# Patient Record
Sex: Female | Born: 1955 | Race: White | Hispanic: No | Marital: Married | Smoking: Never smoker
Health system: Southern US, Community
[De-identification: ages and names within clinical notes are randomized; demographics above are authoritative.]

## PROBLEM LIST (undated history)

## (undated) DIAGNOSIS — G5600 Carpal tunnel syndrome, unspecified upper limb: Secondary | ICD-10-CM

## (undated) DIAGNOSIS — M1990 Unspecified osteoarthritis, unspecified site: Secondary | ICD-10-CM

## (undated) DIAGNOSIS — F329 Major depressive disorder, single episode, unspecified: Secondary | ICD-10-CM

## (undated) DIAGNOSIS — I10 Essential (primary) hypertension: Secondary | ICD-10-CM

## (undated) DIAGNOSIS — E785 Hyperlipidemia, unspecified: Secondary | ICD-10-CM

## (undated) DIAGNOSIS — R1011 Right upper quadrant pain: Secondary | ICD-10-CM

## (undated) DIAGNOSIS — K219 Gastro-esophageal reflux disease without esophagitis: Secondary | ICD-10-CM

## (undated) DIAGNOSIS — F32A Depression, unspecified: Secondary | ICD-10-CM

## (undated) HISTORY — DX: Unspecified osteoarthritis, unspecified site: M19.90

## (undated) HISTORY — PX: EYE SURGERY: SHX253

## (undated) HISTORY — DX: Carpal tunnel syndrome, unspecified upper limb: G56.00

## (undated) HISTORY — DX: Gastro-esophageal reflux disease without esophagitis: K21.9

## (undated) HISTORY — DX: Major depressive disorder, single episode, unspecified: F32.9

## (undated) HISTORY — DX: Depression, unspecified: F32.A

## (undated) HISTORY — DX: Right upper quadrant pain: R10.11

## (undated) HISTORY — DX: Hyperlipidemia, unspecified: E78.5

## (undated) HISTORY — DX: Essential (primary) hypertension: I10

---

## 2005-02-23 ENCOUNTER — Ambulatory Visit: Payer: Self-pay | Admitting: Family Medicine

## 2005-02-24 ENCOUNTER — Ambulatory Visit: Payer: Self-pay | Admitting: Family Medicine

## 2005-03-17 ENCOUNTER — Ambulatory Visit: Payer: Self-pay | Admitting: Family Medicine

## 2005-03-24 ENCOUNTER — Ambulatory Visit: Payer: Self-pay | Admitting: Family Medicine

## 2005-03-27 ENCOUNTER — Encounter: Admission: RE | Admit: 2005-03-27 | Discharge: 2005-03-27 | Payer: Self-pay | Admitting: Family Medicine

## 2005-04-11 ENCOUNTER — Ambulatory Visit: Payer: Self-pay | Admitting: Family Medicine

## 2005-06-01 ENCOUNTER — Ambulatory Visit: Payer: Self-pay | Admitting: Family Medicine

## 2005-08-25 ENCOUNTER — Ambulatory Visit: Payer: Self-pay | Admitting: Family Medicine

## 2005-10-03 ENCOUNTER — Ambulatory Visit: Payer: Self-pay | Admitting: Family Medicine

## 2005-10-18 ENCOUNTER — Ambulatory Visit: Payer: Self-pay

## 2005-12-06 ENCOUNTER — Ambulatory Visit: Payer: Self-pay | Admitting: Family Medicine

## 2006-03-30 ENCOUNTER — Ambulatory Visit: Payer: Self-pay | Admitting: Family Medicine

## 2006-05-30 ENCOUNTER — Ambulatory Visit: Payer: Self-pay | Admitting: Family Medicine

## 2006-05-30 LAB — CONVERTED CEMR LAB
ALT: 27 units/L (ref 0–40)
AST: 19 units/L (ref 0–37)
Albumin: 3.8 g/dL (ref 3.5–5.2)
Alkaline Phosphatase: 87 units/L (ref 39–117)
BUN: 14 mg/dL (ref 6–23)
Basophils Absolute: 0 10*3/uL (ref 0.0–0.1)
Basophils Relative: 0.4 % (ref 0.0–1.0)
CO2: 30 meq/L (ref 19–32)
Calcium: 9.5 mg/dL (ref 8.4–10.5)
Chloride: 104 meq/L (ref 96–112)
Creatinine, Ser: 0.8 mg/dL (ref 0.4–1.2)
Eosinophil percent: 0.1 % (ref 0.0–5.0)
GFR calc non Af Amer: 80 mL/min
Glomerular Filtration Rate, Af Am: 97 mL/min/{1.73_m2}
Glucose, Bld: 177 mg/dL — ABNORMAL HIGH (ref 70–99)
HCT: 43.9 % (ref 36.0–46.0)
Hemoglobin: 14.7 g/dL (ref 12.0–15.0)
Hgb A1c MFr Bld: 8.3 % — ABNORMAL HIGH (ref 4.6–6.0)
Lymphocytes Relative: 35.8 % (ref 12.0–46.0)
MCHC: 33.4 g/dL (ref 30.0–36.0)
MCV: 95.4 fL (ref 78.0–100.0)
Monocytes Absolute: 0.5 10*3/uL (ref 0.2–0.7)
Monocytes Relative: 7.9 % (ref 3.0–11.0)
Neutro Abs: 3.6 10*3/uL (ref 1.4–7.7)
Neutrophils Relative %: 55.8 % (ref 43.0–77.0)
Platelets: 219 10*3/uL (ref 150–400)
Potassium: 4.6 meq/L (ref 3.5–5.1)
RBC: 4.6 M/uL (ref 3.87–5.11)
RDW: 12 % (ref 11.5–14.6)
Sodium: 141 meq/L (ref 135–145)
TSH: 0.87 microintl units/mL (ref 0.35–5.50)
Total Bilirubin: 0.7 mg/dL (ref 0.3–1.2)
Total Protein: 7.3 g/dL (ref 6.0–8.3)
WBC: 6.4 10*3/uL (ref 4.5–10.5)

## 2006-06-25 ENCOUNTER — Encounter: Admission: RE | Admit: 2006-06-25 | Discharge: 2006-06-25 | Payer: Self-pay | Admitting: Family Medicine

## 2006-07-20 ENCOUNTER — Ambulatory Visit: Payer: Self-pay | Admitting: Family Medicine

## 2006-08-30 ENCOUNTER — Ambulatory Visit: Payer: Self-pay | Admitting: Family Medicine

## 2006-08-30 LAB — CONVERTED CEMR LAB
ALT: 29 units/L (ref 0–40)
AST: 21 units/L (ref 0–37)
Albumin: 3.9 g/dL (ref 3.5–5.2)
Alkaline Phosphatase: 100 units/L (ref 39–117)
BUN: 11 mg/dL (ref 6–23)
Bilirubin, Direct: 0.1 mg/dL (ref 0.0–0.3)
CO2: 31 meq/L (ref 19–32)
Calcium: 9.3 mg/dL (ref 8.4–10.5)
Chloride: 103 meq/L (ref 96–112)
Creatinine, Ser: 0.6 mg/dL (ref 0.4–1.2)
GFR calc Af Amer: 135 mL/min
GFR calc non Af Amer: 112 mL/min
Glucose, Bld: 146 mg/dL — ABNORMAL HIGH (ref 70–99)
Hgb A1c MFr Bld: 8.5 % — ABNORMAL HIGH (ref 4.6–6.0)
Potassium: 4.4 meq/L (ref 3.5–5.1)
Sodium: 141 meq/L (ref 135–145)
Total Bilirubin: 0.7 mg/dL (ref 0.3–1.2)
Total Protein: 7 g/dL (ref 6.0–8.3)

## 2006-09-04 ENCOUNTER — Encounter: Payer: Self-pay | Admitting: Family Medicine

## 2006-09-10 ENCOUNTER — Encounter: Payer: Self-pay | Admitting: Cardiovascular Disease

## 2006-09-10 ENCOUNTER — Ambulatory Visit: Payer: Self-pay

## 2006-09-12 ENCOUNTER — Ambulatory Visit: Payer: Self-pay | Admitting: Internal Medicine

## 2006-09-13 ENCOUNTER — Ambulatory Visit: Payer: Self-pay | Admitting: Family Medicine

## 2006-10-03 ENCOUNTER — Ambulatory Visit: Payer: Self-pay | Admitting: Gastroenterology

## 2006-10-03 LAB — CONVERTED CEMR LAB
IgA: 361 mg/dL (ref 68–378)
TSH: 0.77 microintl units/mL (ref 0.35–5.50)
Tissue Transglutaminase Ab, IgA: <3 units (ref <5)

## 2006-10-24 ENCOUNTER — Ambulatory Visit: Payer: Self-pay | Admitting: Gastroenterology

## 2006-10-24 LAB — CONVERTED CEMR LAB
Fecal Occult Blood: NEGATIVE
OCCULT 1: NEGATIVE
OCCULT 2: NEGATIVE
OCCULT 3: NEGATIVE
OCCULT 4: NEGATIVE
OCCULT 5: NEGATIVE

## 2007-01-22 ENCOUNTER — Ambulatory Visit: Payer: Self-pay | Admitting: Family Medicine

## 2007-01-22 DIAGNOSIS — F329 Major depressive disorder, single episode, unspecified: Secondary | ICD-10-CM | POA: Insufficient documentation

## 2007-01-22 DIAGNOSIS — K219 Gastro-esophageal reflux disease without esophagitis: Secondary | ICD-10-CM | POA: Insufficient documentation

## 2007-01-22 DIAGNOSIS — IMO0002 Reserved for concepts with insufficient information to code with codable children: Secondary | ICD-10-CM | POA: Insufficient documentation

## 2007-01-22 DIAGNOSIS — I10 Essential (primary) hypertension: Secondary | ICD-10-CM | POA: Insufficient documentation

## 2007-01-24 LAB — CONVERTED CEMR LAB
ALT: 24 units/L (ref 0–35)
AST: 18 units/L (ref 0–37)
Albumin: 3.9 g/dL (ref 3.5–5.2)
Alkaline Phosphatase: 100 units/L (ref 39–117)
BUN: 12 mg/dL (ref 6–23)
Bilirubin, Direct: 0.1 mg/dL (ref 0.0–0.3)
CO2: 31 meq/L (ref 19–32)
Calcium: 9.5 mg/dL (ref 8.4–10.5)
Chloride: 105 meq/L (ref 96–112)
Cholesterol: 196 mg/dL (ref 0–200)
Creatinine, Ser: 0.8 mg/dL (ref 0.4–1.2)
Creatinine,U: 169.5 mg/dL
GFR calc Af Amer: 97 mL/min
GFR calc non Af Amer: 80 mL/min
Glucose, Bld: 169 mg/dL — ABNORMAL HIGH (ref 70–99)
HDL: 37.3 mg/dL — ABNORMAL LOW (ref >39.0)
Hgb A1c MFr Bld: 8.5 % — ABNORMAL HIGH (ref 4.6–6.0)
LDL Cholesterol: 120 mg/dL — ABNORMAL HIGH (ref 0–99)
Microalb Creat Ratio: 1.8 mg/g (ref 0.0–30.0)
Microalb, Ur: 0.3 mg/dL (ref 0.0–1.9)
Potassium: 4.4 meq/L (ref 3.5–5.1)
Sodium: 141 meq/L (ref 135–145)
TSH: 0.85 microintl units/mL (ref 0.35–5.50)
Total Bilirubin: 0.8 mg/dL (ref 0.3–1.2)
Total CHOL/HDL Ratio: 5.3
Total Protein: 7.2 g/dL (ref 6.0–8.3)
Triglycerides: 195 mg/dL — ABNORMAL HIGH (ref 0–149)
VLDL: 39 mg/dL (ref 0–40)

## 2007-02-11 ENCOUNTER — Telehealth (INDEPENDENT_AMBULATORY_CARE_PROVIDER_SITE_OTHER): Payer: Self-pay | Admitting: *Deleted

## 2007-03-11 ENCOUNTER — Telehealth (INDEPENDENT_AMBULATORY_CARE_PROVIDER_SITE_OTHER): Payer: Self-pay | Admitting: *Deleted

## 2007-03-12 ENCOUNTER — Ambulatory Visit: Payer: Self-pay | Admitting: Family Medicine

## 2007-03-12 DIAGNOSIS — Z8669 Personal history of other diseases of the nervous system and sense organs: Secondary | ICD-10-CM | POA: Insufficient documentation

## 2007-03-15 ENCOUNTER — Encounter (INDEPENDENT_AMBULATORY_CARE_PROVIDER_SITE_OTHER): Payer: Self-pay | Admitting: *Deleted

## 2007-05-20 ENCOUNTER — Ambulatory Visit: Payer: Self-pay | Admitting: Family Medicine

## 2007-05-20 DIAGNOSIS — E785 Hyperlipidemia, unspecified: Secondary | ICD-10-CM | POA: Insufficient documentation

## 2007-05-21 LAB — CONVERTED CEMR LAB
ALT: 27 units/L (ref 0–35)
AST: 16 units/L (ref 0–37)
Albumin: 4 g/dL (ref 3.5–5.2)
Alkaline Phosphatase: 95 units/L (ref 39–117)
BUN: 9 mg/dL (ref 6–23)
Basophils Absolute: 0.2 10*3/uL — ABNORMAL HIGH (ref 0.0–0.1)
Basophils Relative: 2.3 % — ABNORMAL HIGH (ref 0.0–1.0)
Bilirubin, Direct: 0.1 mg/dL (ref 0.0–0.3)
CO2: 30 meq/L (ref 19–32)
Calcium: 9.4 mg/dL (ref 8.4–10.5)
Chloride: 101 meq/L (ref 96–112)
Cholesterol: 187 mg/dL (ref 0–200)
Creatinine, Ser: 0.7 mg/dL (ref 0.4–1.2)
Eosinophils Absolute: 0 10*3/uL (ref 0.0–0.6)
Eosinophils Relative: 0 % (ref 0.0–5.0)
GFR calc Af Amer: 113 mL/min
GFR calc non Af Amer: 93 mL/min
Glucose, Bld: 153 mg/dL — ABNORMAL HIGH (ref 70–99)
HCT: 42.8 % (ref 36.0–46.0)
HDL: 37.5 mg/dL — ABNORMAL LOW (ref >39.0)
Hemoglobin: 14.8 g/dL (ref 12.0–15.0)
Hgb A1c MFr Bld: 7.9 % — ABNORMAL HIGH (ref 4.6–6.0)
LDL Cholesterol: 119 mg/dL — ABNORMAL HIGH (ref 0–99)
Lymphocytes Relative: 34.9 % (ref 12.0–46.0)
MCHC: 34.7 g/dL (ref 30.0–36.0)
MCV: 94.1 fL (ref 78.0–100.0)
Monocytes Absolute: 0.6 10*3/uL (ref 0.2–0.7)
Monocytes Relative: 6.8 % (ref 3.0–11.0)
Neutro Abs: 4.6 10*3/uL (ref 1.4–7.7)
Neutrophils Relative %: 56 % (ref 43.0–77.0)
Platelets: 243 10*3/uL (ref 150–400)
Potassium: 4.3 meq/L (ref 3.5–5.1)
RBC: 4.55 M/uL (ref 3.87–5.11)
RDW: 12 % (ref 11.5–14.6)
Sodium: 139 meq/L (ref 135–145)
Total Bilirubin: 0.7 mg/dL (ref 0.3–1.2)
Total CHOL/HDL Ratio: 5
Total Protein: 7.4 g/dL (ref 6.0–8.3)
Triglycerides: 154 mg/dL — ABNORMAL HIGH (ref 0–149)
VLDL: 31 mg/dL (ref 0–40)
WBC: 8.3 10*3/uL (ref 4.5–10.5)

## 2007-08-08 ENCOUNTER — Ambulatory Visit: Payer: Self-pay | Admitting: Family Medicine

## 2007-08-08 DIAGNOSIS — L258 Unspecified contact dermatitis due to other agents: Secondary | ICD-10-CM | POA: Insufficient documentation

## 2007-10-10 ENCOUNTER — Ambulatory Visit: Payer: Self-pay | Admitting: Family Medicine

## 2007-10-22 LAB — CONVERTED CEMR LAB
ALT: 29 units/L (ref 0–35)
AST: 22 units/L (ref 0–37)
Albumin: 3.8 g/dL (ref 3.5–5.2)
Alkaline Phosphatase: 85 units/L (ref 39–117)
BUN: 14 mg/dL (ref 6–23)
Bilirubin, Direct: 0.1 mg/dL (ref 0.0–0.3)
CO2: 29 meq/L (ref 19–32)
Calcium: 8.8 mg/dL (ref 8.4–10.5)
Chloride: 103 meq/L (ref 96–112)
Cholesterol: 201 mg/dL (ref 0–200)
Creatinine, Ser: 0.7 mg/dL (ref 0.4–1.2)
Direct LDL: 138.5 mg/dL
GFR calc Af Amer: 113 mL/min
GFR calc non Af Amer: 93 mL/min
Glucose, Bld: 164 mg/dL — ABNORMAL HIGH (ref 70–99)
HDL: 43.6 mg/dL (ref >39.0)
Hgb A1c MFr Bld: 7.2 % — ABNORMAL HIGH (ref 4.6–6.0)
Potassium: 4 meq/L (ref 3.5–5.1)
Sodium: 137 meq/L (ref 135–145)
Total Bilirubin: 0.6 mg/dL (ref 0.3–1.2)
Total CHOL/HDL Ratio: 4.6
Total Protein: 7.2 g/dL (ref 6.0–8.3)
Triglycerides: 124 mg/dL (ref 0–149)
VLDL: 25 mg/dL (ref 0–40)

## 2007-10-23 ENCOUNTER — Encounter (INDEPENDENT_AMBULATORY_CARE_PROVIDER_SITE_OTHER): Payer: Self-pay | Admitting: *Deleted

## 2007-12-05 ENCOUNTER — Telehealth: Payer: Self-pay | Admitting: Family Medicine

## 2007-12-30 ENCOUNTER — Ambulatory Visit: Payer: Self-pay | Admitting: Internal Medicine

## 2007-12-30 ENCOUNTER — Inpatient Hospital Stay (HOSPITAL_COMMUNITY): Admission: AD | Admit: 2007-12-30 | Discharge: 2008-01-03 | Payer: Self-pay | Admitting: Internal Medicine

## 2007-12-30 ENCOUNTER — Telehealth (INDEPENDENT_AMBULATORY_CARE_PROVIDER_SITE_OTHER): Payer: Self-pay | Admitting: *Deleted

## 2008-01-03 ENCOUNTER — Encounter: Payer: Self-pay | Admitting: Internal Medicine

## 2008-01-06 ENCOUNTER — Telehealth (INDEPENDENT_AMBULATORY_CARE_PROVIDER_SITE_OTHER): Payer: Self-pay | Admitting: *Deleted

## 2008-01-10 ENCOUNTER — Ambulatory Visit: Payer: Self-pay | Admitting: Internal Medicine

## 2008-01-10 DIAGNOSIS — B379 Candidiasis, unspecified: Secondary | ICD-10-CM | POA: Insufficient documentation

## 2008-01-10 DIAGNOSIS — T887XXA Unspecified adverse effect of drug or medicament, initial encounter: Secondary | ICD-10-CM | POA: Insufficient documentation

## 2008-01-10 DIAGNOSIS — J189 Pneumonia, unspecified organism: Secondary | ICD-10-CM | POA: Insufficient documentation

## 2008-03-03 ENCOUNTER — Ambulatory Visit: Payer: Self-pay | Admitting: Family Medicine

## 2008-03-03 DIAGNOSIS — M722 Plantar fascial fibromatosis: Secondary | ICD-10-CM | POA: Insufficient documentation

## 2008-03-20 ENCOUNTER — Encounter (INDEPENDENT_AMBULATORY_CARE_PROVIDER_SITE_OTHER): Payer: Self-pay | Admitting: *Deleted

## 2008-04-21 ENCOUNTER — Encounter: Payer: Self-pay | Admitting: Family Medicine

## 2008-04-27 ENCOUNTER — Ambulatory Visit: Payer: Self-pay | Admitting: Family Medicine

## 2008-05-03 LAB — CONVERTED CEMR LAB
ALT: 18 units/L (ref 0–35)
AST: 18 units/L (ref 0–37)
Albumin: 3.8 g/dL (ref 3.5–5.2)
Alkaline Phosphatase: 82 units/L (ref 39–117)
Bilirubin, Direct: 0.1 mg/dL (ref 0.0–0.3)
Cholesterol: 176 mg/dL (ref 0–200)
Creatinine,U: 175.8 mg/dL
HDL: 40.4 mg/dL (ref >39.0)
Hgb A1c MFr Bld: 7.7 % — ABNORMAL HIGH (ref 4.6–6.0)
LDL Cholesterol: 114 mg/dL — ABNORMAL HIGH (ref 0–99)
Microalb Creat Ratio: 2.8 mg/g (ref 0.0–30.0)
Microalb, Ur: 0.5 mg/dL (ref 0.0–1.9)
Total Bilirubin: 0.8 mg/dL (ref 0.3–1.2)
Total CHOL/HDL Ratio: 4.4
Total Protein: 7.1 g/dL (ref 6.0–8.3)
Triglycerides: 109 mg/dL (ref 0–149)
VLDL: 22 mg/dL (ref 0–40)

## 2008-05-04 ENCOUNTER — Telehealth (INDEPENDENT_AMBULATORY_CARE_PROVIDER_SITE_OTHER): Payer: Self-pay | Admitting: *Deleted

## 2008-06-02 ENCOUNTER — Encounter: Payer: Self-pay | Admitting: Family Medicine

## 2008-06-03 ENCOUNTER — Encounter (INDEPENDENT_AMBULATORY_CARE_PROVIDER_SITE_OTHER): Payer: Self-pay | Admitting: *Deleted

## 2008-06-08 ENCOUNTER — Telehealth (INDEPENDENT_AMBULATORY_CARE_PROVIDER_SITE_OTHER): Payer: Self-pay | Admitting: *Deleted

## 2008-07-02 ENCOUNTER — Ambulatory Visit: Payer: Self-pay | Admitting: Family Medicine

## 2008-07-02 ENCOUNTER — Encounter (INDEPENDENT_AMBULATORY_CARE_PROVIDER_SITE_OTHER): Payer: Self-pay | Admitting: *Deleted

## 2008-07-03 ENCOUNTER — Encounter (INDEPENDENT_AMBULATORY_CARE_PROVIDER_SITE_OTHER): Payer: Self-pay | Admitting: *Deleted

## 2008-07-07 LAB — CONVERTED CEMR LAB
ALT: 25 units/L (ref 0–35)
AST: 19 units/L (ref 0–37)
Albumin: 4 g/dL (ref 3.5–5.2)
Alkaline Phosphatase: 85 units/L (ref 39–117)
BUN: 13 mg/dL (ref 6–23)
Basophils Absolute: 0 10*3/uL (ref 0.0–0.1)
Basophils Relative: 0.2 % (ref 0.0–3.0)
Bilirubin, Direct: 0.1 mg/dL (ref 0.0–0.3)
CO2: 27 meq/L (ref 19–32)
Calcium: 9.1 mg/dL (ref 8.4–10.5)
Chloride: 106 meq/L (ref 96–112)
Cholesterol: 173 mg/dL (ref 0–200)
Creatinine, Ser: 0.6 mg/dL (ref 0.4–1.2)
Creatinine,U: 137.9 mg/dL
Eosinophils Absolute: 0 10*3/uL (ref 0.0–0.7)
Eosinophils Relative: 0 % (ref 0.0–5.0)
GFR calc Af Amer: 134 mL/min
GFR calc non Af Amer: 111 mL/min
Glucose, Bld: 175 mg/dL — ABNORMAL HIGH (ref 70–99)
HCT: 42.2 % (ref 36.0–46.0)
HDL: 34.4 mg/dL — ABNORMAL LOW (ref >39.0)
Hemoglobin: 14.7 g/dL (ref 12.0–15.0)
Hgb A1c MFr Bld: 7.2 % — ABNORMAL HIGH (ref 4.6–6.0)
LDL Cholesterol: 108 mg/dL — ABNORMAL HIGH (ref 0–99)
Lymphocytes Relative: 41.6 % (ref 12.0–46.0)
MCHC: 34.7 g/dL (ref 30.0–36.0)
MCV: 94.4 fL (ref 78.0–100.0)
Microalb Creat Ratio: 3.6 mg/g (ref 0.0–30.0)
Microalb, Ur: 0.5 mg/dL (ref 0.0–1.9)
Monocytes Absolute: 0.6 10*3/uL (ref 0.1–1.0)
Monocytes Relative: 8.7 % (ref 3.0–12.0)
Neutro Abs: 3.1 10*3/uL (ref 1.4–7.7)
Neutrophils Relative %: 49.5 % (ref 43.0–77.0)
Platelets: 201 10*3/uL (ref 150–400)
Potassium: 4.1 meq/L (ref 3.5–5.1)
RBC: 4.47 M/uL (ref 3.87–5.11)
RDW: 12.3 % (ref 11.5–14.6)
Sodium: 140 meq/L (ref 135–145)
TSH: 0.81 microintl units/mL (ref 0.35–5.50)
Total Bilirubin: 0.7 mg/dL (ref 0.3–1.2)
Total CHOL/HDL Ratio: 5
Total Protein: 7.3 g/dL (ref 6.0–8.3)
Triglycerides: 153 mg/dL — ABNORMAL HIGH (ref 0–149)
VLDL: 31 mg/dL (ref 0–40)
Vit D, 1,25-Dihydroxy: 23 — ABNORMAL LOW (ref 30–89)
WBC: 6.4 10*3/uL (ref 4.5–10.5)

## 2008-07-09 ENCOUNTER — Telehealth: Payer: Self-pay | Admitting: Family Medicine

## 2008-07-09 ENCOUNTER — Encounter (INDEPENDENT_AMBULATORY_CARE_PROVIDER_SITE_OTHER): Payer: Self-pay | Admitting: *Deleted

## 2008-07-13 ENCOUNTER — Other Ambulatory Visit: Admission: RE | Admit: 2008-07-13 | Discharge: 2008-07-13 | Payer: Self-pay | Admitting: Family Medicine

## 2008-07-13 ENCOUNTER — Encounter: Payer: Self-pay | Admitting: Family Medicine

## 2008-07-13 ENCOUNTER — Ambulatory Visit: Payer: Self-pay | Admitting: Family Medicine

## 2008-07-14 ENCOUNTER — Encounter: Payer: Self-pay | Admitting: Family Medicine

## 2008-07-20 ENCOUNTER — Encounter: Admission: RE | Admit: 2008-07-20 | Discharge: 2008-07-20 | Payer: Self-pay | Admitting: Family Medicine

## 2008-07-20 ENCOUNTER — Telehealth (INDEPENDENT_AMBULATORY_CARE_PROVIDER_SITE_OTHER): Payer: Self-pay | Admitting: *Deleted

## 2008-07-20 ENCOUNTER — Encounter (INDEPENDENT_AMBULATORY_CARE_PROVIDER_SITE_OTHER): Payer: Self-pay | Admitting: *Deleted

## 2008-07-20 LAB — HM MAMMOGRAPHY: HM Mammogram: NORMAL

## 2008-07-24 ENCOUNTER — Encounter (INDEPENDENT_AMBULATORY_CARE_PROVIDER_SITE_OTHER): Payer: Self-pay | Admitting: *Deleted

## 2008-07-29 ENCOUNTER — Encounter (INDEPENDENT_AMBULATORY_CARE_PROVIDER_SITE_OTHER): Payer: Self-pay | Admitting: *Deleted

## 2008-09-10 ENCOUNTER — Telehealth (INDEPENDENT_AMBULATORY_CARE_PROVIDER_SITE_OTHER): Payer: Self-pay | Admitting: *Deleted

## 2008-10-05 ENCOUNTER — Ambulatory Visit: Payer: Self-pay | Admitting: Family Medicine

## 2008-10-12 ENCOUNTER — Encounter: Payer: Self-pay | Admitting: Family Medicine

## 2008-10-15 ENCOUNTER — Encounter: Payer: Self-pay | Admitting: Family Medicine

## 2008-10-24 LAB — CONVERTED CEMR LAB
ALT: 17 units/L (ref 0–35)
AST: 17 units/L (ref 0–37)
Albumin: 3.9 g/dL (ref 3.5–5.2)
Alkaline Phosphatase: 75 units/L (ref 39–117)
BUN: 16 mg/dL (ref 6–23)
Basophils Absolute: 0 10*3/uL (ref 0.0–0.1)
Basophils Relative: 0.1 % (ref 0.0–3.0)
Bilirubin, Direct: 0 mg/dL (ref 0.0–0.3)
CO2: 30 meq/L (ref 19–32)
Calcium: 8.9 mg/dL (ref 8.4–10.5)
Chloride: 103 meq/L (ref 96–112)
Cholesterol: 130 mg/dL (ref 0–200)
Creatinine, Ser: 0.7 mg/dL (ref 0.4–1.2)
Creatinine,U: 70.2 mg/dL
Eosinophils Absolute: 0 10*3/uL (ref 0.0–0.7)
Eosinophils Relative: 0.3 % (ref 0.0–5.0)
Folate: >20 ng/mL
GFR calc non Af Amer: 92.52 mL/min (ref >60)
Glucose, Bld: 105 mg/dL — ABNORMAL HIGH (ref 70–99)
HCT: 43.7 % (ref 36.0–46.0)
HDL: 46.2 mg/dL (ref >39.00)
Hemoglobin: 15.1 g/dL — ABNORMAL HIGH (ref 12.0–15.0)
Hgb A1c MFr Bld: 6.8 % — ABNORMAL HIGH (ref 4.6–6.5)
LDL Cholesterol: 66 mg/dL (ref 0–99)
Lymphocytes Relative: 34.6 % (ref 12.0–46.0)
Lymphs Abs: 2.8 10*3/uL (ref 0.7–4.0)
MCHC: 34.5 g/dL (ref 30.0–36.0)
MCV: 94.8 fL (ref 78.0–100.0)
Microalb Creat Ratio: 1.4 mg/g (ref 0.0–30.0)
Microalb, Ur: 0.1 mg/dL (ref 0.0–1.9)
Monocytes Absolute: 0.7 10*3/uL (ref 0.1–1.0)
Monocytes Relative: 8.7 % (ref 3.0–12.0)
Neutro Abs: 4.6 10*3/uL (ref 1.4–7.7)
Neutrophils Relative %: 56.3 % (ref 43.0–77.0)
Platelets: 205 10*3/uL (ref 150.0–400.0)
Potassium: 3.7 meq/L (ref 3.5–5.1)
RBC: 4.61 M/uL (ref 3.87–5.11)
RDW: 12.5 % (ref 11.5–14.6)
Sodium: 141 meq/L (ref 135–145)
Total Bilirubin: 0.8 mg/dL (ref 0.3–1.2)
Total CHOL/HDL Ratio: 3
Total Protein: 7.2 g/dL (ref 6.0–8.3)
Triglycerides: 89 mg/dL (ref 0.0–149.0)
VLDL: 17.8 mg/dL (ref 0.0–40.0)
Vitamin B-12: 378 pg/mL (ref 211–911)
WBC: 8.1 10*3/uL (ref 4.5–10.5)

## 2008-10-26 ENCOUNTER — Encounter (INDEPENDENT_AMBULATORY_CARE_PROVIDER_SITE_OTHER): Payer: Self-pay | Admitting: *Deleted

## 2008-11-02 ENCOUNTER — Encounter: Payer: Self-pay | Admitting: Family Medicine

## 2008-11-09 ENCOUNTER — Telehealth (INDEPENDENT_AMBULATORY_CARE_PROVIDER_SITE_OTHER): Payer: Self-pay | Admitting: *Deleted

## 2008-11-10 ENCOUNTER — Telehealth (INDEPENDENT_AMBULATORY_CARE_PROVIDER_SITE_OTHER): Payer: Self-pay | Admitting: *Deleted

## 2008-11-11 ENCOUNTER — Telehealth: Payer: Self-pay | Admitting: Family Medicine

## 2008-11-19 ENCOUNTER — Telehealth (INDEPENDENT_AMBULATORY_CARE_PROVIDER_SITE_OTHER): Payer: Self-pay | Admitting: *Deleted

## 2008-11-20 ENCOUNTER — Encounter: Payer: Self-pay | Admitting: Family Medicine

## 2008-11-27 ENCOUNTER — Encounter: Payer: Self-pay | Admitting: Family Medicine

## 2008-12-21 ENCOUNTER — Telehealth: Payer: Self-pay | Admitting: Family Medicine

## 2009-02-03 ENCOUNTER — Ambulatory Visit: Payer: Self-pay | Admitting: Family Medicine

## 2009-02-03 DIAGNOSIS — J209 Acute bronchitis, unspecified: Secondary | ICD-10-CM | POA: Insufficient documentation

## 2009-02-03 LAB — CONVERTED CEMR LAB
Bilirubin Urine: NEGATIVE
Blood in Urine, dipstick: NEGATIVE
Glucose, Urine, Semiquant: NEGATIVE
Ketones, urine, test strip: NEGATIVE
Nitrite: NEGATIVE
Protein, U semiquant: NEGATIVE
Specific Gravity, Urine: 1.025
Urobilinogen, UA: NEGATIVE
WBC Urine, dipstick: NEGATIVE

## 2009-02-04 ENCOUNTER — Telehealth (INDEPENDENT_AMBULATORY_CARE_PROVIDER_SITE_OTHER): Payer: Self-pay | Admitting: *Deleted

## 2009-02-08 LAB — CONVERTED CEMR LAB
Creatinine,U: 181 mg/dL
Microalb Creat Ratio: 4.4 mg/g (ref 0.0–30.0)
Microalb, Ur: 0.8 mg/dL (ref 0.0–1.9)

## 2009-02-09 ENCOUNTER — Encounter (INDEPENDENT_AMBULATORY_CARE_PROVIDER_SITE_OTHER): Payer: Self-pay | Admitting: *Deleted

## 2009-02-14 LAB — CONVERTED CEMR LAB
ALT: 21 units/L (ref 0–35)
AST: 24 units/L (ref 0–37)
Albumin: 4 g/dL (ref 3.5–5.2)
Alkaline Phosphatase: 81 units/L (ref 39–117)
BUN: 14 mg/dL (ref 6–23)
Basophils Absolute: 0.1 10*3/uL (ref 0.0–0.1)
Basophils Relative: 0.9 % (ref 0.0–3.0)
Bilirubin, Direct: 0 mg/dL (ref 0.0–0.3)
CO2: 30 meq/L (ref 19–32)
Calcium: 9.1 mg/dL (ref 8.4–10.5)
Chloride: 103 meq/L (ref 96–112)
Cholesterol: 250 mg/dL — ABNORMAL HIGH (ref 0–200)
Creatinine, Ser: 0.8 mg/dL (ref 0.4–1.2)
Direct LDL: 190.7 mg/dL
Eosinophils Absolute: 0 10*3/uL (ref 0.0–0.7)
Eosinophils Relative: 0.2 % (ref 0.0–5.0)
GFR calc non Af Amer: 79.21 mL/min (ref >60)
Glucose, Bld: 151 mg/dL — ABNORMAL HIGH (ref 70–99)
HCT: 42.3 % (ref 36.0–46.0)
HDL: 42.5 mg/dL (ref >39.00)
Hemoglobin: 14.7 g/dL (ref 12.0–15.0)
Hgb A1c MFr Bld: 7.6 % — ABNORMAL HIGH (ref 4.6–6.5)
Lymphocytes Relative: 30.1 % (ref 12.0–46.0)
Lymphs Abs: 2.5 10*3/uL (ref 0.7–4.0)
MCHC: 34.7 g/dL (ref 30.0–36.0)
MCV: 95.2 fL (ref 78.0–100.0)
Monocytes Absolute: 0.7 10*3/uL (ref 0.1–1.0)
Monocytes Relative: 8.7 % (ref 3.0–12.0)
Neutro Abs: 5.1 10*3/uL (ref 1.4–7.7)
Neutrophils Relative %: 60.1 % (ref 43.0–77.0)
Platelets: 228 10*3/uL (ref 150.0–400.0)
Potassium: 4.3 meq/L (ref 3.5–5.1)
RBC: 4.44 M/uL (ref 3.87–5.11)
RDW: 12.3 % (ref 11.5–14.6)
Sodium: 139 meq/L (ref 135–145)
Total Bilirubin: 1.1 mg/dL (ref 0.3–1.2)
Total CHOL/HDL Ratio: 6
Total Protein: 7.6 g/dL (ref 6.0–8.3)
Triglycerides: 148 mg/dL (ref 0.0–149.0)
VLDL: 29.6 mg/dL (ref 0.0–40.0)
WBC: 8.4 10*3/uL (ref 4.5–10.5)

## 2009-02-17 ENCOUNTER — Telehealth: Payer: Self-pay | Admitting: Family Medicine

## 2009-02-24 ENCOUNTER — Telehealth: Payer: Self-pay | Admitting: Family Medicine

## 2009-04-09 ENCOUNTER — Ambulatory Visit: Payer: Self-pay | Admitting: Family Medicine

## 2009-04-09 DIAGNOSIS — IMO0002 Reserved for concepts with insufficient information to code with codable children: Secondary | ICD-10-CM | POA: Insufficient documentation

## 2009-04-20 ENCOUNTER — Ambulatory Visit: Payer: Self-pay | Admitting: Family Medicine

## 2009-04-20 DIAGNOSIS — B373 Candidiasis of vulva and vagina: Secondary | ICD-10-CM | POA: Insufficient documentation

## 2009-04-20 DIAGNOSIS — R300 Dysuria: Secondary | ICD-10-CM | POA: Insufficient documentation

## 2009-04-20 LAB — CONVERTED CEMR LAB
Bilirubin Urine: NEGATIVE
Blood in Urine, dipstick: NEGATIVE
Glucose, Urine, Semiquant: NEGATIVE
Ketones, urine, test strip: NEGATIVE
Nitrite: NEGATIVE
Protein, U semiquant: NEGATIVE
Specific Gravity, Urine: 1.025
Urobilinogen, UA: 0.2
WBC Urine, dipstick: NEGATIVE
pH: 6

## 2009-04-22 ENCOUNTER — Encounter: Payer: Self-pay | Admitting: Family Medicine

## 2009-04-26 ENCOUNTER — Encounter: Payer: Self-pay | Admitting: Family Medicine

## 2009-05-14 ENCOUNTER — Observation Stay (HOSPITAL_COMMUNITY): Admission: AD | Admit: 2009-05-14 | Discharge: 2009-05-15 | Payer: Self-pay | Admitting: Family Medicine

## 2009-05-14 ENCOUNTER — Ambulatory Visit: Payer: Self-pay | Admitting: Family Medicine

## 2009-05-14 ENCOUNTER — Ambulatory Visit: Payer: Self-pay | Admitting: Cardiovascular Disease

## 2009-05-14 ENCOUNTER — Encounter (INDEPENDENT_AMBULATORY_CARE_PROVIDER_SITE_OTHER): Payer: Self-pay | Admitting: Internal Medicine

## 2009-05-14 DIAGNOSIS — R079 Chest pain, unspecified: Secondary | ICD-10-CM | POA: Insufficient documentation

## 2009-05-17 ENCOUNTER — Encounter: Payer: Self-pay | Admitting: Family Medicine

## 2009-05-18 ENCOUNTER — Telehealth: Payer: Self-pay | Admitting: Family Medicine

## 2009-05-25 ENCOUNTER — Telehealth (INDEPENDENT_AMBULATORY_CARE_PROVIDER_SITE_OTHER): Payer: Self-pay | Admitting: *Deleted

## 2009-05-26 ENCOUNTER — Encounter (HOSPITAL_COMMUNITY): Admission: RE | Admit: 2009-05-26 | Discharge: 2009-07-14 | Payer: Self-pay | Admitting: Family Medicine

## 2009-05-26 ENCOUNTER — Encounter: Payer: Self-pay | Admitting: Internal Medicine

## 2009-05-26 ENCOUNTER — Ambulatory Visit: Payer: Self-pay | Admitting: Internal Medicine

## 2009-05-26 ENCOUNTER — Ambulatory Visit: Payer: Self-pay

## 2009-05-28 ENCOUNTER — Encounter: Payer: Self-pay | Admitting: Family Medicine

## 2009-06-02 ENCOUNTER — Ambulatory Visit: Payer: Self-pay | Admitting: Family Medicine

## 2009-06-02 DIAGNOSIS — B070 Plantar wart: Secondary | ICD-10-CM | POA: Insufficient documentation

## 2009-06-02 DIAGNOSIS — M549 Dorsalgia, unspecified: Secondary | ICD-10-CM | POA: Insufficient documentation

## 2009-06-02 DIAGNOSIS — D239 Other benign neoplasm of skin, unspecified: Secondary | ICD-10-CM | POA: Insufficient documentation

## 2009-06-02 LAB — HM DIABETES FOOT EXAM

## 2009-06-11 IMAGING — CT CT ANGIO CHEST
2 of 4 series · 19 of 36 positions shown · IV contrast (omnipaque)
Comparison: Chest radiographs 12/31/2007.

CLINICAL DATA: 51-year-old female with cough fever chills.
Pneumonia suspected.

CT ANGIOGRAPHY CHEST
TECHNIQUE: Multidetector CT imaging of the chest using the
standard protocol during bolus administration of intravenous
contrast. Multiplanar reconstructed images obtained and reviewed to
evaluate the vascular anatomy.
Contrast: 80 ml Omnipaque 350.

[Series 8: pulm embolism 1.0 b25f thins · axial · 0.62mm/px · z∈[-260,+25]mm · 16 of 317 slices shown]
[im 16/317  lung]
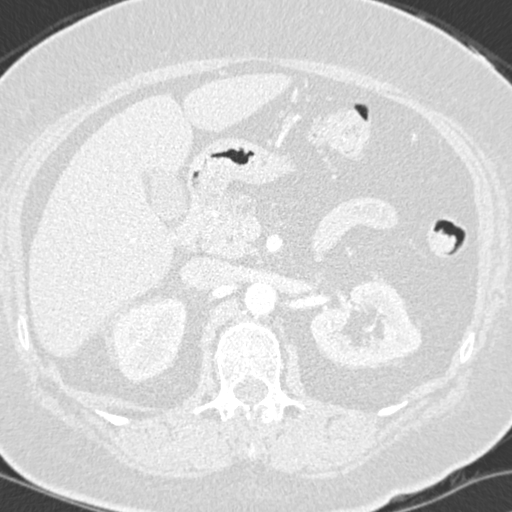
[im 32/317  mediastinal]
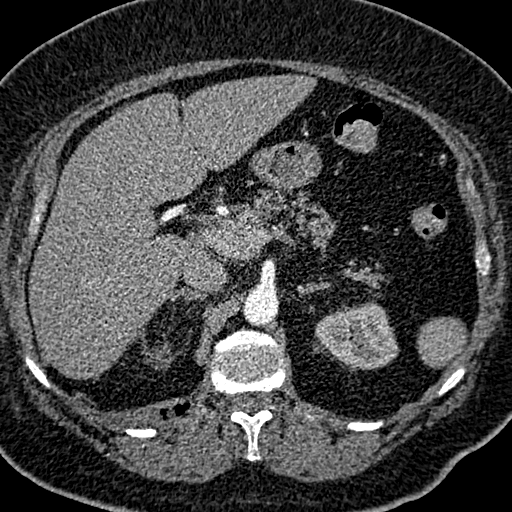
[im 48/317  lung]
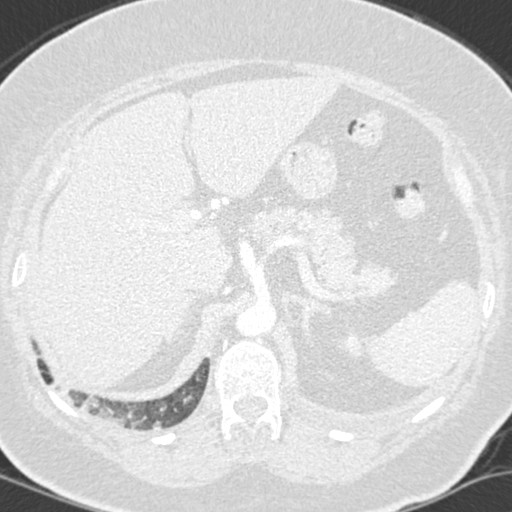
[im 80/317  mediastinal]
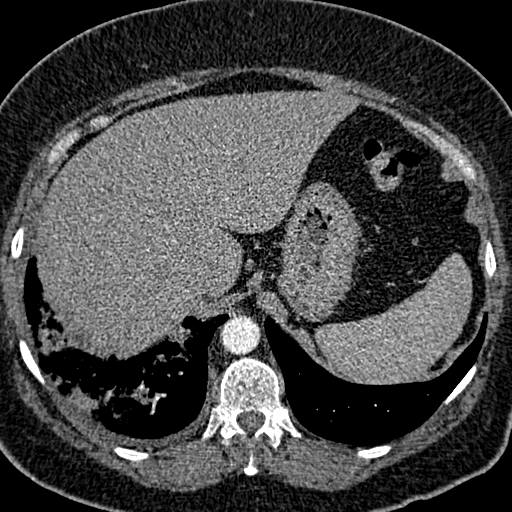
[im 95/317  lung]
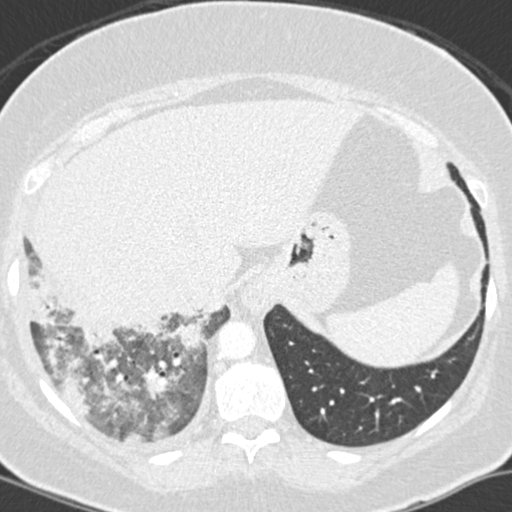
[im 111/317  mediastinal]
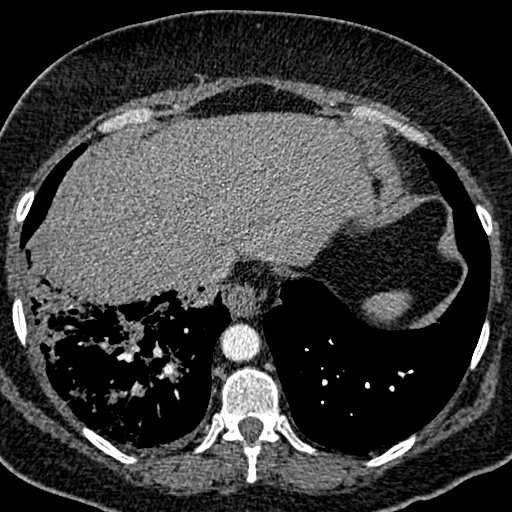
[im 127/317  lung]
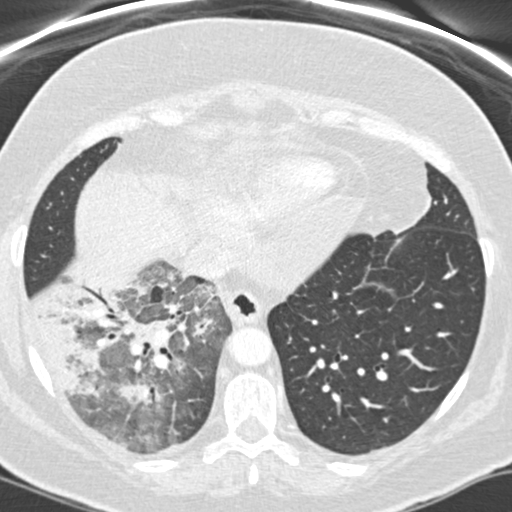
[im 143/317  mediastinal]
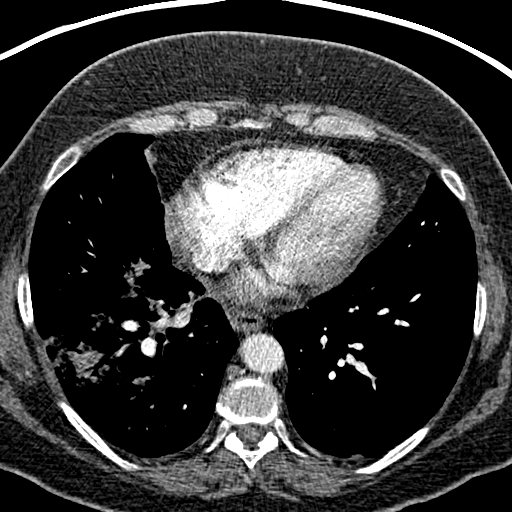
[im 174/317  lung]
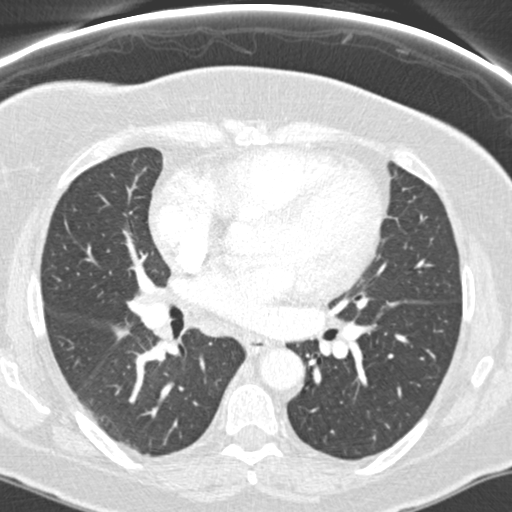
[im 190/317  mediastinal]
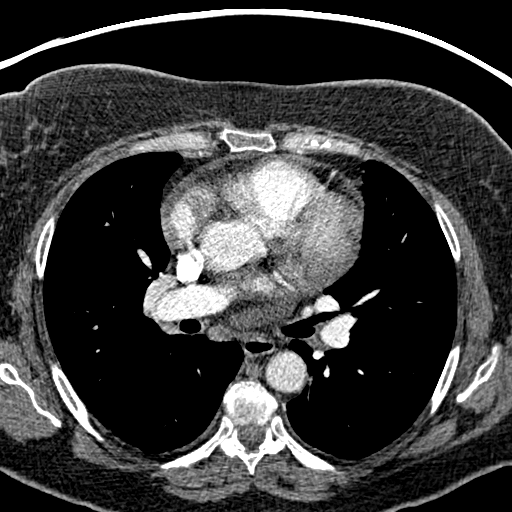
[im 206/317  lung]
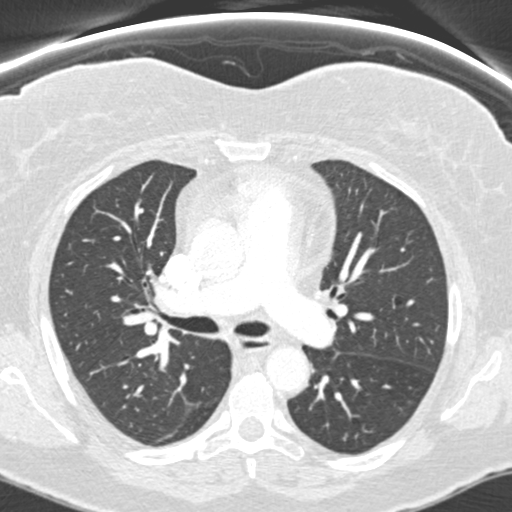
[im 222/317  mediastinal]
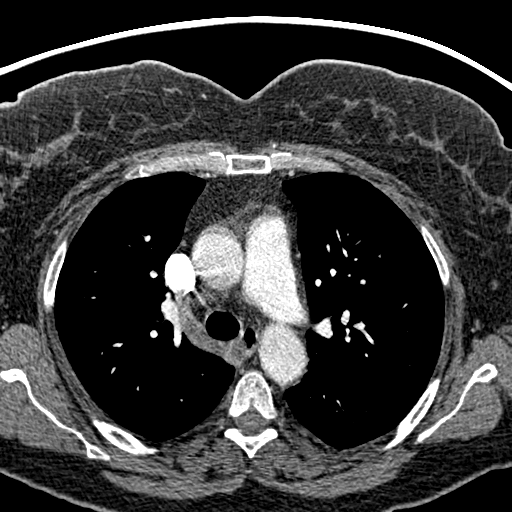
[im 238/317  lung]
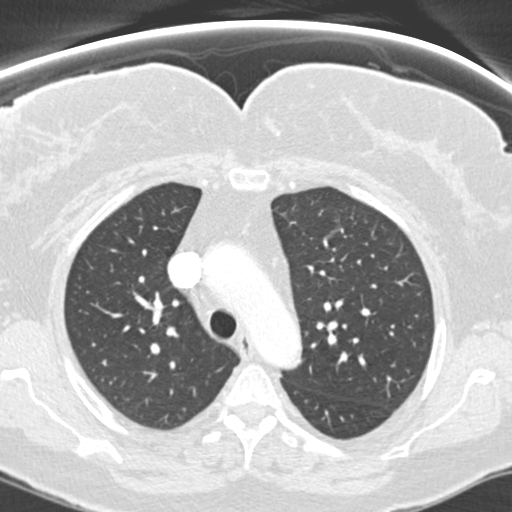
[im 269/317  mediastinal]
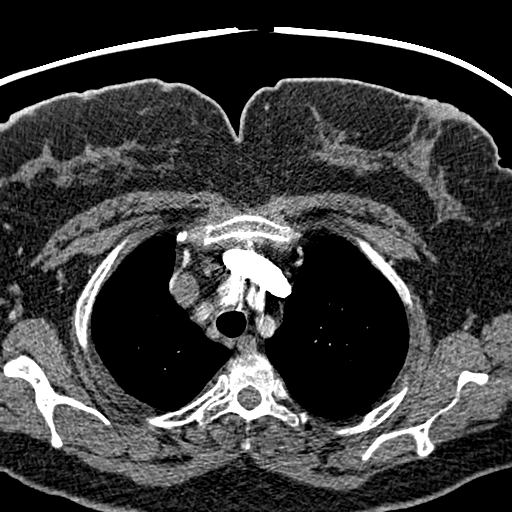
[im 285/317  lung]
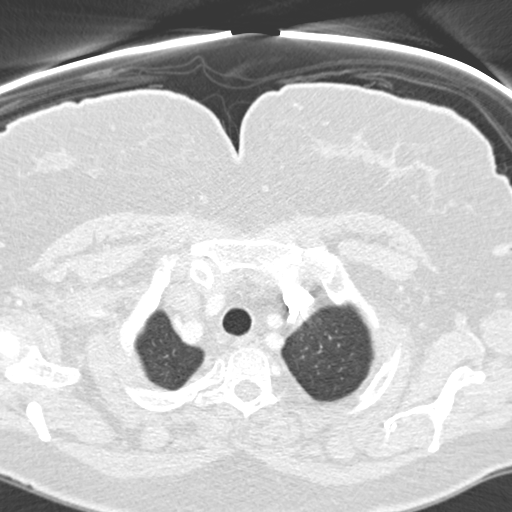
[im 301/317  mediastinal]
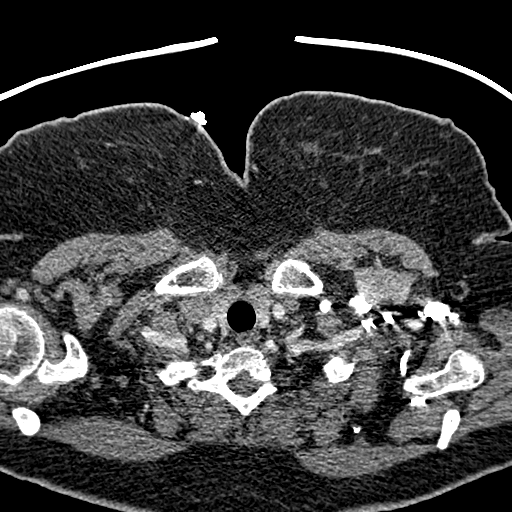

[Series 602: coronals · coronal · 0.62mm/px · 3 of 77 slices shown]
[im 16/77  mediastinal]
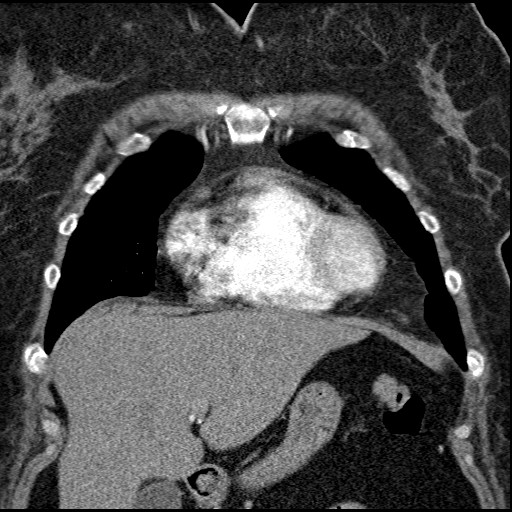
[im 31/77  mediastinal]
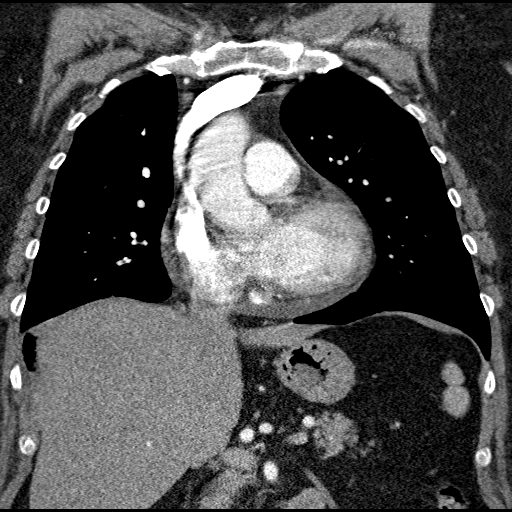
[im 46/77  mediastinal]
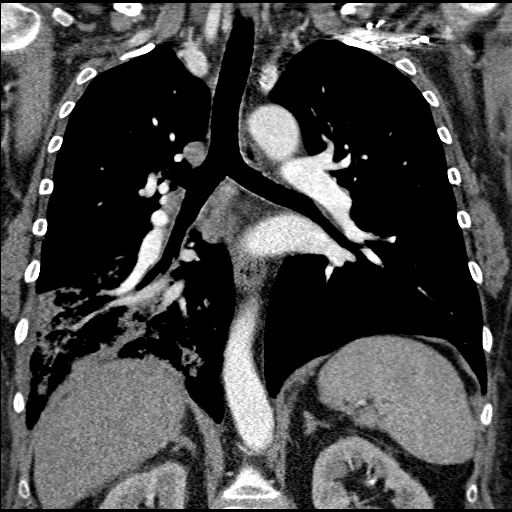

[19 of 36 positions shown; findings below may reference images not displayed]

FINDINGS: Adequate contrast bolus timing in the pulmonary arterial
tree.  No focal filling defect identified in the pulmonary arterial
tree to suggest the presence of acute pulmonary embolism.

Airspace consolidation and areas of alveolar ground-glass opacity
throughout the right lower lobe.  Major airways are patent.  No
emphysema.  Left lung is clear.  Right upper lobe is clear aside
from minimal involvement of the inferior most right upper lobe
adjacent to the major fissure.

Thoracic inlet within normal limits.  No pericardial or pleural
effusion.  Small mediastinal nodes.  Reactive right hilar lymph
nodes.  Visualized upper abdominal viscera are within normal
limits.  No acute osseous abnormality.
IMPRESSION: 1. No evidence of acute pulmonary embolus.
2.  Right lower lobe pneumonia.  No emphysema.  No pleural
effusion.]]

## 2009-06-15 ENCOUNTER — Encounter (INDEPENDENT_AMBULATORY_CARE_PROVIDER_SITE_OTHER): Payer: Self-pay | Admitting: *Deleted

## 2009-06-15 ENCOUNTER — Telehealth (INDEPENDENT_AMBULATORY_CARE_PROVIDER_SITE_OTHER): Payer: Self-pay | Admitting: *Deleted

## 2009-06-15 LAB — CONVERTED CEMR LAB
ALT: 26 units/L (ref 0–35)
AST: 21 units/L (ref 0–37)
Albumin: 4.4 g/dL (ref 3.5–5.2)
Alkaline Phosphatase: 80 units/L (ref 39–117)
BUN: 13 mg/dL (ref 6–23)
Basophils Absolute: 0 10*3/uL (ref 0.0–0.1)
Basophils Relative: 0 % (ref 0.0–3.0)
Bilirubin, Direct: 0 mg/dL (ref 0.0–0.3)
CO2: 30 meq/L (ref 19–32)
Calcium: 9.7 mg/dL (ref 8.4–10.5)
Chloride: 104 meq/L (ref 96–112)
Cholesterol: 150 mg/dL (ref 0–200)
Creatinine, Ser: 0.7 mg/dL (ref 0.4–1.2)
Creatinine,U: 184.5 mg/dL
Eosinophils Absolute: 0 10*3/uL (ref 0.0–0.7)
Eosinophils Relative: 0.3 % (ref 0.0–5.0)
GFR calc non Af Amer: 92.3 mL/min (ref >60)
Glucose, Bld: 120 mg/dL — ABNORMAL HIGH (ref 70–99)
HCT: 45 % (ref 36.0–46.0)
HDL: 46.5 mg/dL (ref >39.00)
Hemoglobin: 15.2 g/dL — ABNORMAL HIGH (ref 12.0–15.0)
Hgb A1c MFr Bld: 7.3 % — ABNORMAL HIGH (ref 4.6–6.5)
LDL Cholesterol: 83 mg/dL (ref 0–99)
Lymphocytes Relative: 37.4 % (ref 12.0–46.0)
Lymphs Abs: 3 10*3/uL (ref 0.7–4.0)
MCHC: 33.8 g/dL (ref 30.0–36.0)
MCV: 98.2 fL (ref 78.0–100.0)
Microalb Creat Ratio: 2.2 mg/g (ref 0.0–30.0)
Microalb, Ur: 0.4 mg/dL (ref 0.0–1.9)
Monocytes Absolute: 0.7 10*3/uL (ref 0.1–1.0)
Monocytes Relative: 8.7 % (ref 3.0–12.0)
Neutro Abs: 4.3 10*3/uL (ref 1.4–7.7)
Neutrophils Relative %: 53.6 % (ref 43.0–77.0)
Platelets: 184 10*3/uL (ref 150.0–400.0)
Potassium: 4.7 meq/L (ref 3.5–5.1)
RBC: 4.59 M/uL (ref 3.87–5.11)
RDW: 11.9 % (ref 11.5–14.6)
Sodium: 143 meq/L (ref 135–145)
Total Bilirubin: 0.9 mg/dL (ref 0.3–1.2)
Total CHOL/HDL Ratio: 3
Total Protein: 7.7 g/dL (ref 6.0–8.3)
Triglycerides: 102 mg/dL (ref 0.0–149.0)
VLDL: 20.4 mg/dL (ref 0.0–40.0)
WBC: 8 10*3/uL (ref 4.5–10.5)

## 2009-07-05 ENCOUNTER — Ambulatory Visit: Payer: Self-pay | Admitting: Endocrinology

## 2009-07-05 DIAGNOSIS — R109 Unspecified abdominal pain: Secondary | ICD-10-CM | POA: Insufficient documentation

## 2009-07-06 LAB — CONVERTED CEMR LAB
ALT: 24 units/L (ref 0–35)
AST: 18 units/L (ref 0–37)
Albumin: 3.9 g/dL (ref 3.5–5.2)
Alkaline Phosphatase: 92 units/L (ref 39–117)
Amylase: 94 units/L (ref 27–131)
Basophils Absolute: 0 10*3/uL (ref 0.0–0.1)
Basophils Relative: 0.1 % (ref 0.0–3.0)
Bilirubin, Direct: 0.1 mg/dL (ref 0.0–0.3)
Eosinophils Absolute: 0.1 10*3/uL (ref 0.0–0.7)
Eosinophils Relative: 0.6 % (ref 0.0–5.0)
HCT: 44.6 % (ref 36.0–46.0)
Hemoglobin: 15 g/dL (ref 12.0–15.0)
Lymphocytes Relative: 37.7 % (ref 12.0–46.0)
Lymphs Abs: 3.6 10*3/uL (ref 0.7–4.0)
MCHC: 33.7 g/dL (ref 30.0–36.0)
MCV: 95.9 fL (ref 78.0–100.0)
Monocytes Absolute: 0.8 10*3/uL (ref 0.1–1.0)
Monocytes Relative: 8.7 % (ref 3.0–12.0)
Neutro Abs: 5 10*3/uL (ref 1.4–7.7)
Neutrophils Relative %: 52.9 % (ref 43.0–77.0)
Platelets: 183 10*3/uL (ref 150.0–400.0)
RBC: 4.65 M/uL (ref 3.87–5.11)
RDW: 12 % (ref 11.5–14.6)
Total Bilirubin: 0.7 mg/dL (ref 0.3–1.2)
Total Protein: 7 g/dL (ref 6.0–8.3)
WBC: 9.5 10*3/uL (ref 4.5–10.5)

## 2009-07-08 ENCOUNTER — Encounter: Admission: RE | Admit: 2009-07-08 | Discharge: 2009-07-08 | Payer: Self-pay | Admitting: Endocrinology

## 2009-07-20 ENCOUNTER — Telehealth: Payer: Self-pay | Admitting: Family Medicine

## 2009-07-20 ENCOUNTER — Telehealth: Payer: Self-pay | Admitting: Endocrinology

## 2009-08-02 ENCOUNTER — Ambulatory Visit (HOSPITAL_COMMUNITY): Admission: RE | Admit: 2009-08-02 | Discharge: 2009-08-02 | Payer: Self-pay | Admitting: Endocrinology

## 2009-08-10 ENCOUNTER — Ambulatory Visit: Payer: Self-pay | Admitting: Endocrinology

## 2009-08-12 ENCOUNTER — Encounter: Payer: Self-pay | Admitting: Endocrinology

## 2009-08-12 ENCOUNTER — Telehealth (INDEPENDENT_AMBULATORY_CARE_PROVIDER_SITE_OTHER): Payer: Self-pay | Admitting: *Deleted

## 2009-08-16 ENCOUNTER — Encounter: Payer: Self-pay | Admitting: Endocrinology

## 2009-10-18 ENCOUNTER — Telehealth (INDEPENDENT_AMBULATORY_CARE_PROVIDER_SITE_OTHER): Payer: Self-pay | Admitting: *Deleted

## 2009-10-22 ENCOUNTER — Telehealth (INDEPENDENT_AMBULATORY_CARE_PROVIDER_SITE_OTHER): Payer: Self-pay | Admitting: *Deleted

## 2009-10-25 ENCOUNTER — Encounter: Payer: Self-pay | Admitting: Family Medicine

## 2009-10-28 ENCOUNTER — Ambulatory Visit: Payer: Self-pay | Admitting: Internal Medicine

## 2009-10-28 DIAGNOSIS — R5383 Other fatigue: Secondary | ICD-10-CM

## 2009-10-28 DIAGNOSIS — R5381 Other malaise: Secondary | ICD-10-CM | POA: Insufficient documentation

## 2009-10-28 DIAGNOSIS — M79609 Pain in unspecified limb: Secondary | ICD-10-CM | POA: Insufficient documentation

## 2009-11-11 ENCOUNTER — Ambulatory Visit: Payer: Self-pay

## 2009-11-11 ENCOUNTER — Encounter: Payer: Self-pay | Admitting: Internal Medicine

## 2009-12-28 ENCOUNTER — Ambulatory Visit: Payer: Self-pay | Admitting: Family Medicine

## 2009-12-29 ENCOUNTER — Telehealth (INDEPENDENT_AMBULATORY_CARE_PROVIDER_SITE_OTHER): Payer: Self-pay | Admitting: *Deleted

## 2009-12-29 LAB — CONVERTED CEMR LAB
ALT: 19 units/L (ref 0–35)
AST: 15 units/L (ref 0–37)
Albumin: 3.9 g/dL (ref 3.5–5.2)
Alkaline Phosphatase: 72 units/L (ref 39–117)
Bilirubin, Direct: 0.1 mg/dL (ref 0.0–0.3)
Cholesterol: 230 mg/dL — ABNORMAL HIGH (ref 0–200)
Direct LDL: 155.1 mg/dL
HDL: 43.7 mg/dL (ref >39.00)
Total Bilirubin: 0.4 mg/dL (ref 0.3–1.2)
Total CHOL/HDL Ratio: 5
Total Protein: 6.8 g/dL (ref 6.0–8.3)
Triglycerides: 258 mg/dL — ABNORMAL HIGH (ref 0.0–149.0)
VLDL: 51.6 mg/dL — ABNORMAL HIGH (ref 0.0–40.0)

## 2010-01-31 ENCOUNTER — Ambulatory Visit: Payer: Self-pay | Admitting: Family Medicine

## 2010-01-31 DIAGNOSIS — R51 Headache: Secondary | ICD-10-CM | POA: Insufficient documentation

## 2010-01-31 DIAGNOSIS — R519 Headache, unspecified: Secondary | ICD-10-CM | POA: Insufficient documentation

## 2010-01-31 DIAGNOSIS — K7689 Other specified diseases of liver: Secondary | ICD-10-CM | POA: Insufficient documentation

## 2010-02-09 LAB — CONVERTED CEMR LAB
ALT: 20 units/L (ref 0–35)
AST: 22 units/L (ref 0–37)
Albumin: 4.4 g/dL (ref 3.5–5.2)
Alkaline Phosphatase: 71 units/L (ref 39–117)
BUN: 18 mg/dL (ref 6–23)
Basophils Absolute: 0 10*3/uL (ref 0.0–0.1)
Basophils Relative: 0.4 % (ref 0.0–3.0)
Bilirubin, Direct: 0.2 mg/dL (ref 0.0–0.3)
CO2: 30 meq/L (ref 19–32)
Calcium: 9.7 mg/dL (ref 8.4–10.5)
Chloride: 105 meq/L (ref 96–112)
Creatinine, Ser: 0.8 mg/dL (ref 0.4–1.2)
Eosinophils Absolute: 0 10*3/uL (ref 0.0–0.7)
Eosinophils Relative: 0.3 % (ref 0.0–5.0)
Folate: 19.5 ng/mL
GFR calc non Af Amer: 85.03 mL/min (ref >60)
Glucose, Bld: 166 mg/dL — ABNORMAL HIGH (ref 70–99)
HCT: 43 % (ref 36.0–46.0)
Hemoglobin: 14.7 g/dL (ref 12.0–15.0)
Lymphocytes Relative: 49.4 % — ABNORMAL HIGH (ref 12.0–46.0)
Lymphs Abs: 3.5 10*3/uL (ref 0.7–4.0)
MCHC: 34.2 g/dL (ref 30.0–36.0)
MCV: 96.3 fL (ref 78.0–100.0)
Monocytes Absolute: 0.9 10*3/uL (ref 0.1–1.0)
Monocytes Relative: 13.1 % — ABNORMAL HIGH (ref 3.0–12.0)
Neutro Abs: 2.6 10*3/uL (ref 1.4–7.7)
Neutrophils Relative %: 36.8 % — ABNORMAL LOW (ref 43.0–77.0)
Platelets: 217 10*3/uL (ref 150.0–400.0)
Potassium: 4.9 meq/L (ref 3.5–5.1)
RBC: 4.46 M/uL (ref 3.87–5.11)
RDW: 12.6 % (ref 11.5–14.6)
Sed Rate: 9 mm/hr (ref 0–22)
Sodium: 138 meq/L (ref 135–145)
TSH: 0.72 microintl units/mL (ref 0.35–5.50)
Total Bilirubin: 0.4 mg/dL (ref 0.3–1.2)
Total Protein: 7.4 g/dL (ref 6.0–8.3)
Vitamin B-12: 350 pg/mL (ref 211–911)
WBC: 7.1 10*3/uL (ref 4.5–10.5)

## 2010-03-24 ENCOUNTER — Ambulatory Visit: Payer: Self-pay | Admitting: Family Medicine

## 2010-03-24 DIAGNOSIS — N390 Urinary tract infection, site not specified: Secondary | ICD-10-CM | POA: Insufficient documentation

## 2010-03-24 LAB — CONVERTED CEMR LAB
Bilirubin Urine: NEGATIVE
Blood in Urine, dipstick: NEGATIVE
Glucose, Urine, Semiquant: NEGATIVE
Ketones, urine, test strip: NEGATIVE
Nitrite: NEGATIVE
Protein, U semiquant: NEGATIVE
Specific Gravity, Urine: 1.025
Urobilinogen, UA: 0.2
WBC Urine, dipstick: NEGATIVE
pH: 5

## 2010-03-25 ENCOUNTER — Encounter: Payer: Self-pay | Admitting: Family Medicine

## 2010-03-25 LAB — CONVERTED CEMR LAB
BUN: 14 mg/dL (ref 6–23)
CO2: 28 meq/L (ref 19–32)
Calcium: 9.3 mg/dL (ref 8.4–10.5)
Chloride: 101 meq/L (ref 96–112)
Creatinine, Ser: 0.7 mg/dL (ref 0.4–1.2)
GFR calc non Af Amer: 96.8 mL/min (ref >60)
Glucose, Bld: 125 mg/dL — ABNORMAL HIGH (ref 70–99)
Potassium: 4.3 meq/L (ref 3.5–5.1)
Sodium: 139 meq/L (ref 135–145)

## 2010-03-30 ENCOUNTER — Telehealth (INDEPENDENT_AMBULATORY_CARE_PROVIDER_SITE_OTHER): Payer: Self-pay | Admitting: *Deleted

## 2010-04-11 ENCOUNTER — Ambulatory Visit: Payer: Self-pay | Admitting: Family Medicine

## 2010-04-11 DIAGNOSIS — J019 Acute sinusitis, unspecified: Secondary | ICD-10-CM | POA: Insufficient documentation

## 2010-04-11 LAB — CONVERTED CEMR LAB
Bilirubin Urine: NEGATIVE
Blood in Urine, dipstick: NEGATIVE
Glucose, Urine, Semiquant: 250
Ketones, urine, test strip: NEGATIVE
Nitrite: NEGATIVE
Specific Gravity, Urine: 1.025
Urobilinogen, UA: 0.2
WBC Urine, dipstick: NEGATIVE
pH: 6

## 2010-05-04 ENCOUNTER — Ambulatory Visit: Payer: Self-pay | Admitting: Endocrinology

## 2010-05-04 LAB — CONVERTED CEMR LAB: Hgb A1c MFr Bld: 7.2 % — ABNORMAL HIGH (ref 4.6–6.5)

## 2010-05-10 ENCOUNTER — Ambulatory Visit: Payer: Self-pay | Admitting: Family Medicine

## 2010-05-10 DIAGNOSIS — F411 Generalized anxiety disorder: Secondary | ICD-10-CM | POA: Insufficient documentation

## 2010-05-10 LAB — CONVERTED CEMR LAB
Bilirubin Urine: NEGATIVE
Blood in Urine, dipstick: NEGATIVE
Glucose, Urine, Semiquant: NEGATIVE
Nitrite: NEGATIVE
Specific Gravity, Urine: 1.02
Urobilinogen, UA: 0.2
WBC Urine, dipstick: NEGATIVE
pH: 5

## 2010-05-19 ENCOUNTER — Ambulatory Visit: Payer: Self-pay | Admitting: Family Medicine

## 2010-08-03 ENCOUNTER — Encounter: Payer: Self-pay | Admitting: Family Medicine

## 2010-08-05 ENCOUNTER — Encounter: Payer: Self-pay | Admitting: Family Medicine

## 2010-08-07 ENCOUNTER — Encounter: Payer: Self-pay | Admitting: Family Medicine

## 2010-08-16 NOTE — Progress Notes (Signed)
Summary: LOWNE--  Phone Note Call from Patient Call back at [facility] Phone [phone]   Caller: Patient Summary of Call: PT IS CALLING WANTING TO SEE DR. Laury Axon ON FRI  FOR A HOSP FOLLOW UP FOR A COUGH AND CHILL. THE DOCTOR ASK HER TO CALL AND MAKE AN APPT FOR  FRIDAY AND  SHE MENTION SHE WILL BE OUT OF MEDICINE ON FRI TOO.SHE SAID IT WILL HAVE TO BE ON FRI.THE 26TH. Initial call taken by: Freddy Jaksch,  January 06, 2008 10:09 AM  Follow-up for Phone Call        I am sorry I don't understand all of this message.  Please help me understand.  Thanks,  Marcelino Duster We need to schedule at next opening available. [name]  January 06, 2008 12:31 PM  Follow-up by: [name],  January 06, 2008 10:17 AM         Appended Document: LOWNE-- Pt was call and given appt on Fri with Dr. Alwyn Ren.

## 2010-08-16 NOTE — Consult Note (Signed)
Summary: [facility]   Imported By: Lester Sylvanite 08/31/2009 08:18:40  _____________________________________________________________________  External Attachment:    Type:   Image     Comment:   External Document

## 2010-08-16 NOTE — Assessment & Plan Note (Signed)
Summary: left leg pain--acute only--tl   Vital Signs:  Patient Profile:   55 Years Old Female Weight:      226.2 pounds BP sitting:   110 / 82  (right arm)  Pt. in pain?   no  Vitals Entered By: [name] (March 03, 2008 3:48 PM)                  PCP:  Laury Axon  Chief Complaint:  pain left heel of foot.  History of Present Illness: Pt here c/o L foot pain for several weeks.   Pt is worse when she first gets up in am then as she is walking it is better.  Then if she sits down and gets back up it hurts again.   - otc meds.    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 [place]        Mother: DM       Siblings:none     Review of Systems      See HPI   Physical Exam  General:     Well-developed,well-nourished,in no acute distress; alert,appropriate and cooperative throughout examination Extremities:     No clubbing, cyanosis, edema, or deformity noted with normal full range of motion of all joints.   Neurologic:     strength normal in all extremities.   Skin:     Intact without suspicious lesions or rashes Cervical Nodes:     No lymphadenopathy noted    Impression & Recommendations:  Problem # 1:  PLANTAR FASCIITIS, LEFT (ICD-728.71) Discussed use of gel inserts, ice massage, and stretching exercises. tylenol arthritis  Complete Medication List: 1)  Ultram 50 Mg Tabs (Tramadol hcl) .... Take 1 tablet by mouth as directed 2)  Prevacid 15 Mg Cpdr (Lansoprazole) .... Take one tablet daily 3)   Lyrica 50 Mg Caps (Pregabalin) .Marland Kitchen.. 1 once daily 4)  Glyburide 5 Mg Tabs (Glyburide) .... Take one tablet twice daily 5)  Cymbalta 60 Mg Cpep (Duloxetine hcl) .Marland Kitchen.. 1 once daily 6)  Actos 45 Mg Tabs (Pioglitazone hcl) .Marland Kitchen.. 1 once daily 7)  Lipitor 80 Mg Tabs (Atorvastatin calcium) .Marland Kitchen.. 1 at bedtime 8)  Gemfibrozil 600 Mg Tabs (Gemfibrozil) .Marland Kitchen.. 1 by mouth two times a day 9)  Glyburide 5 Mg Tabs (Glyburide) .... 2 by mouth two times a day 10)  Vicodin Es 7.5-750 Mg Tabs (Hydrocodone-acetaminophen) .Marland Kitchen.. 1 by mouth every 6 hours as needed 11)  Diovan 160 Mg Tabs (Valsartan) .Marland Kitchen.. 1 once daily    ]

## 2010-08-16 NOTE — Progress Notes (Signed)
Summary: Nulcear Pre-Procedure  Phone Note Outgoing Call Call back at [facility] Phone [phone]   Call placed by: Stanton Kidney, EMT-P,  May 25, 2009 1:36 PM Call placed to: Patient Action Taken: Phone Call Completed Summary of Call: Left message with information on Myoview Information Sheet (see scanned document for details). > Then pt. called. Reviewed information on Myoview Information Sheet (see scanned document for further details).  Spoke with Pt.    Nuclear Med Background Indications for Stress Test: Evaluation for Ischemia, Post Hospital   History: Echo, Heart Catheterization, Myocardial Perfusion Study  History Comments: 4/07 MPS: EF=73%, (-) ischemia 10/10 Echo: EF=55-60% Heart Cath: 7-8 yrs ago([place]), [place] per pt.  Symptoms: Chest Pain, Chest Pain with Exertion    Nuclear Pre-Procedure Cardiac Risk Factors: Hypertension, Lipids, NIDDM Height (in): 64

## 2010-08-16 NOTE — Progress Notes (Signed)
  Phone Note Call from Patient   Summary of Call: Pending approval admission [phone], [facility] [employer] review ext. [phone].

## 2010-08-16 NOTE — Assessment & Plan Note (Signed)
Summary: fu on spider bite/kdc   Vital Signs:  Patient profile:   55 year old female Weight:      229 pounds Temp:     98.0 degrees F oral Pulse rate:   94 / minute Pulse rhythm:   regular BP sitting:   122 / 84  (left arm) Cuff size:   large  Vitals Entered By: Army Fossa CMA (April 20, 2009 2:05 PM) CC: Follow up on spider bite, still red and itchy.    History of Present Illness: Pt here to f/u on spider bite.  See previous visit.  pt states it is better but itchy.  Pt also has vaginal itching after antibiotics.   No other complaints.  Current Medications (verified): 1)  Glyburide 5 Mg  Tabs (Glyburide) .... 2 Tabs By Mouth Two Times A Day 2)  Actos 45 Mg  Tabs (Pioglitazone Hcl) .Marland Kitchen.. 1 Once Daily 3)  Lipitor 80 Mg  Tabs (Atorvastatin Calcium) .Marland Kitchen.. 1 At Bedtime 4)  Glyburide 5 Mg  Tabs (Glyburide) .... 2 By Mouth Two Times A Day 5)  Diovan 160 Mg  Tabs (Valsartan) .Marland Kitchen.. 1 Once Daily 6)  Onglyza 5 Mg Tabs (Saxagliptin Hcl) .... Take 1 Tabs 7)  Zoloft 100 Mg Tabs (Sertraline Hcl) .Marland Kitchen.. 1 By Mouth Once Daily 8)  Triamcinolone Acetonide 0.1 % Oint (Triamcinolone Acetonide) .... Apply To Affected Area Two Times A Day.  Disp 1 Large Tube. 9)  Fluconazole 150 Mg Tabs (Fluconazole) .Marland Kitchen.. 1 By Mouth X1 , May Repeat in 1 Week As Needed 10)  Lotrimin Af 1 % Crea (Clotrimazole) .... As Directed  Allergies (verified): No Known Drug Allergies  Past History:  Past medical, surgical, family and social histories (including risk factors) reviewed for relevance to current acute and chronic problems.  Past Medical History: Reviewed history from 01/10/2008 and no changes required. 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 G6 P 2  Family History: Reviewed history from 12/30/2007 and no changes  required. Father:arthritis, ? [place]  Mother: DM Siblings:none   Social History: Reviewed history from 07/13/2008 and no changes required. no diet Retired Married Drug use-no Regular exercise-no Never Smoked Alcohol use-no  Review of Systems      See HPI  Physical Exam  General:  Well-developed,well-nourished,in no acute distress; alert,appropriate and cooperative throughout examination Skin:  + circular lesion with papules no signs of infection no d/c Cervical Nodes:  No lymphadenopathy noted Psych:  Oriented X3 and normally interactive.     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 1 by mouth x1 , may repeat in 1 week as needed    Lotrimin Af 1 % Crea (Clotrimazole) .Marland Kitchen... As directed  Complete Medication List: 1)  Glyburide 5 Mg Tabs (Glyburide) .... 2 tabs by mouth two times a day 2)  Actos 45 Mg Tabs (Pioglitazone hcl) .Marland Kitchen.. 1 once daily 3)  Lipitor 80 Mg Tabs (Atorvastatin calcium) .Marland Kitchen.. 1 at bedtime 4)  Glyburide 5 Mg Tabs (Glyburide) .... 2 by mouth two times a day 5)  Diovan 160 Mg Tabs (Valsartan) .Marland Kitchen.. 1 once  daily 6)  Onglyza 5 Mg Tabs (Saxagliptin hcl) .... Take 1 tabs 7)  Zoloft 100 Mg Tabs (Sertraline hcl) .Marland Kitchen.. 1 by mouth once daily 8)  Triamcinolone Acetonide 0.1 % Oint (Triamcinolone acetonide) .... Apply to affected area two times a day.  disp 1 large tube. 9)  Fluconazole 150 Mg Tabs (Fluconazole) .Marland Kitchen.. 1 by mouth x1 , may repeat in 1 week as needed 10)  Lotrimin Af 1 % Crea (Clotrimazole) .... As directed  Other Orders: T-Culture, Urine ([id number]) Prescriptions: LOTRIMIN AF 1 % CREA (CLOTRIMAZOLE) as directed  #1 tube x 0   Entered and Authorized by:   Loreen Freud DO   Signed by:   Loreen Freud DO on 04/20/2009   Method used:   Electronically to        [facility] [address] [id number]* (retail)       [address] [place], [place]  [address]       Ph: [phone]       Fax: [phone]   RxID:   [id number] FLUCONAZOLE 150 MG TABS (FLUCONAZOLE) 1 by mouth x1 , may repeat in 1 week as needed  #2 x 2   Entered and Authorized by:   Loreen Freud DO   Signed by:   Loreen Freud DO on 04/20/2009   Method used:   Electronically to        [facility] [address] #[id number]* (retail)       [address] [place], [place]  [address]       Ph: [phone]       Fax: [phone]   RxID:   [id number]   Laboratory Results   Urine Tests    Routine Urinalysis   Color: yellow Appearance: Clear Glucose: negative   (Normal Range: Negative) Bilirubin: negative   (Normal Range: Negative) Ketone: negative   (Normal Range: Negative) Spec. Gravity: 1.025   (Normal Range: 1.003-1.035) Blood: negative   (Normal Range: Negative) pH: 6.0   (Normal Range: 5.0-8.0) Protein: negative   (Normal Range: Negative) Urobilinogen: 0.2   (Normal Range: 0-1) Nitrite: negative   (Normal Range: Negative) Leukocyte Esterace: negative   (Normal Range: Negative)    Comments: Army Fossa CMA  April 20, 2009 2:51 PM

## 2010-08-16 NOTE — Assessment & Plan Note (Signed)
Summary: problem with liver/cbs   Vital Signs:  Patient profile:   56 year old female Height:      66 inches Weight:      220 pounds Temp:     98.3 degrees F oral Pulse rate:   74 / minute BP sitting:   118 / 78  (left arm)  Vitals Entered By: Jeremy Johann CMA (January 31, 2010 1:57 PM) CC: discuss with liver, Headaches Comments REVIEWED MED LIST, PATIENT AGREED DOSE AND INSTRUCTION CORRECT    History of Present Illness: Pt here to discuss Liver---pt dx with fatty liver by Endo and was seen by surgery for RUQ pain--- She wanted to do GB surgery but pt refused.    Pt also c/o HA.    Headaches      This is a 55 year old woman who presents with Headaches.  The symptoms began 3 months ago.  Pt having ha every other day.  The patient denies nausea, vomiting, sweats, tearing of eyes, nasal congestion, sinus pain, sinus pressure, photophobia, and phonophobia.  The headache is described as intermittent and band-like.  The location of the pain is occipital.  The patient denies the following high-risk features: fever, neck pain/stiffness, vision loss or change, focal weakness, altered mental status, rash, trauma, pain worse with exertion, new type of headache, age >50 years, immunosuppression, concomitant infection, and anticoagulation use.  Prior treatment has included no medication.    Current Medications (verified): 1)  Actos 45 Mg  Tabs (Pioglitazone Hcl) .Marland Kitchen.. 1 Once Daily 2)  Lipitor 80 Mg  Tabs (Atorvastatin Calcium) .Marland Kitchen.. 1 At Bedtime 3)  Diovan 160 Mg  Tabs (Valsartan) .Marland Kitchen.. 1 Once Daily 4)  Onglyza 5 Mg Tabs (Saxagliptin Hcl) .... Take 1 Tabs 5)  Zoloft 100 Mg Tabs (Sertraline Hcl) .... 2 By Mouth Once Daily 6)  Glyburide 2.5 Mg Tabs (Glyburide) .Marland Kitchen.. 1 Bid 7)  Metformin Hcl 500 Mg Xr24h-Tab (Metformin Hcl) .... Once Daily At 2pm 8)  Freestyle Test  Strp (Glucose Blood) .... Check Two Times A Day. 9)  Nexium 40 Mg Cpdr (Esomeprazole Magnesium) .... Two Times A Day 10)  Tricor 145 Mg  Tabs (Fenofibrate) .Marland Kitchen.. 1 By Mouth Daily 11)  Bayer Low Strength 81 Mg Tbec (Aspirin) .... Take Once Daily 12)  Alprazolam 0.25 Mg Tabs (Alprazolam) .Marland Kitchen.. 1 By Mouth Three Times A Day As Needed  Allergies (verified): No Known Drug Allergies  Past History:  Past Medical History: Last updated: 10/28/2009 1. Chest pain    --cath 10 years ago ok    --myoview 2007 and 05/2009: normal 2. Depression 3. Diabetes mellitus, type II 4. GERD 5. Hypertension 6. Hyperlipidemia 7. CTS ; 6/15-19/09 RLL  PNA with sepsis,high LFTs(ALT 61)  Past Surgical History: Last updated: 12/30/2007 G6 P 2  Family History: Last updated: 07/05/2009 Father:arthritis, ? [place]  Mother: DM (deceased) Siblings:none   Social History: Last updated: 08/10/2009 no diet Retired Married Drug use-no Regular exercise-no Never Smoked Alcohol use-[place]  Risk Factors: Alcohol Use: <1 (05/14/2009) Caffeine Use: 1 (05/14/2009) Exercise: no (05/14/2009)  Risk Factors: Smoking Status: never (05/14/2009)  Family History: Reviewed history from 07/05/2009 and no changes required. Father:arthritis, ? [place]  Mother: DM (deceased) Siblings:none   Social History: Reviewed history from 08/10/2009 and no changes required. no diet Retired Married Drug use-no Regular exercise-no Never Smoked Alcohol use-no originally from [place]  Review of Systems      See HPI  Physical Exam  General:  Well-developed,well-nourished,in no  acute distress; alert,appropriate and cooperative throughout examination Lungs:  Normal respiratory effort, chest expands symmetrically. Lungs are clear to auscultation, no crackles or wheezes. Heart:  Normal rate and regular rhythm. S1 and S2 normal without gallop, murmur, click, rub or other extra sounds. Extremities:  No clubbing, cyanosis, edema, or deformity noted with normal full range of motion of all joints.   Psych:  Cognition and judgment appear intact. Alert and  cooperative with normal attention span and concentration. No apparent delusions, illusions, hallucinations   Impression & Recommendations:  Problem # 1:  HEADACHE, OCCIPITAL (ICD-784.0)  Her updated medication list for this problem includes:    Bayer Low Strength 81 Mg Tbec (Aspirin) .Marland Kitchen... Take once daily  Orders: Neurology Referral (Neuro) Venipuncture [phone]) TLB-B12 + Folate Pnl ([id number]-B12/FOL) TLB-BMP (Basic Metabolic Panel-BMET) (80048-METABOL) TLB-CBC Platelet - w/Differential (85025-CBCD) TLB-TSH (Thyroid Stimulating Hormone) (84443-TSH) TLB-Hepatic/Liver Function Pnl (80076-HEPATIC) TLB-Sedimentation Rate (ESR) (85652-ESR) Specimen Handling (60454)  Problem # 2:  FATTY LIVER DISEASE (ICD-571.8)  Problem # 3:  FATIGUE / MALAISE (ICD-780.79) r/o OSA---see cardio note Orders: Pulmonary Referral (Pulmonary) Venipuncture (09811) TLB-B12 + Folate Pnl ([id number]-Z30/QMV) TLB-BMP (Basic Metabolic Panel-BMET) (80048-METABOL) TLB-CBC Platelet - w/Differential (85025-CBCD) TLB-TSH (Thyroid Stimulating Hormone) (84443-TSH) TLB-Hepatic/Liver Function Pnl (80076-HEPATIC) TLB-Sedimentation Rate (ESR) (85652-ESR) Specimen Handling (78469)  Complete Medication List: 1)  Actos 45 Mg Tabs (Pioglitazone hcl) .Marland Kitchen.. 1 once daily 2)  Lipitor 80 Mg Tabs (Atorvastatin calcium) .Marland Kitchen.. 1 at bedtime 3)  Diovan 160 Mg Tabs (Valsartan) .Marland Kitchen.. 1 once daily 4)  Onglyza 5 Mg Tabs (Saxagliptin hcl) .... Take 1 tabs 5)  Zoloft 100 Mg Tabs (Sertraline hcl) .... 2 by mouth once daily 6)  Glyburide 2.5 Mg Tabs (Glyburide) .Marland Kitchen.. 1 bid 7)  Metformin Hcl 500 Mg Xr24h-tab (Metformin hcl) .... Once daily at 2pm 8)  Freestyle Test Strp (Glucose blood) .... Check two times a day. 9)  Nexium 40 Mg Cpdr (Esomeprazole magnesium) .... Two times a day 10)  Tricor 145 Mg Tabs (Fenofibrate) .Marland Kitchen.. 1 by mouth daily 11)  Bayer Low Strength 81 Mg Tbec (Aspirin) .... Take once daily 12)  Alprazolam 0.25 Mg Tabs  (Alprazolam) .Marland Kitchen.. 1 by mouth three times a day as needed  Other Orders: Radiology Referral (Radiology) Prescriptions: ALPRAZOLAM 0.25 MG TABS (ALPRAZOLAM) 1 by mouth three times a day as needed  #30 x 0   Entered and Authorized by:   Loreen Freud DO   Signed by:   Loreen Freud DO on 01/31/2010   Method used:   Print then Give to Patient   RxID:   [id number]

## 2010-08-16 NOTE — Letter (Signed)
Summary: Generic Letter  [facility] [place]  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  He is the driver in the family and is unable to drive to [place].  They are requesting a Hydrographic surveyor in [place].                Sincerely,   Loreen Freud DO [facility]

## 2010-08-16 NOTE — Progress Notes (Signed)
Summary: glyburide rx  Phone Note Refill Request   Refills Requested: Medication #1:  GLYBURIDE 5 MG  TABS TAKE ONE TABLET TWICE DAILY pt says she is out of glyburide, and she take 2 bid   per pt she uses [facility] [address], rx faxed.Kandice Hams  June 08, 2008 9:31 AM   Initial call taken by: Kandice Hams,  June 08, 2008 9:32 AM Caller: Patient      Prescriptions: GLYBURIDE 5 MG  TABS (GLYBURIDE) 2 by mouth two times a day  #124 x 2   Entered by:   Kandice Hams   Authorized by:   Loreen Freud DO   Signed by:   Kandice Hams on 06/08/2008   Method used:   Faxed to ...       [facility] [address] [id number]* (retail)       [address] [place], [place]  [address]       Ph: [phone]       Fax: [phone]   RxID:   [id number]

## 2010-08-16 NOTE — Assessment & Plan Note (Signed)
Summary: cpx already had lab/cbs   Vital Signs:  Patient Profile:   55 Years Old Female Height:     64 inches Weight:      224.8 pounds Temp:     98.2 degrees F oral Pulse rate:   80 / minute Resp:     18 per minute BP sitting:   122 / 82  (left arm)  Pt. in pain?   no  Vitals Entered By: Jeremy Johann CMA (July 13, 2008 3:12 PM)                  PCP:  Laury Axon  Chief Complaint:  CpE and Type 2 diabetes mellitus follow-up.  History of Present Illness: Pt here for CPE and pap.     Type 2 Diabetes Mellitus Follow-Up      This is a 55 year old woman who presents for Type 2 diabetes mellitus follow-up.  The patient denies polyuria, polydipsia, blurred vision, self managed hypoglycemia, hypoglycemia requiring help, weight loss, weight gain, and numbness of extremities.  The patient denies the following symptoms: neuropathic pain, chest pain, vomiting, orthostatic symptoms, poor wound healing, intermittent claudication, vision loss, and foot ulcer.  Since the last visit the patient reports poor dietary compliance, noncompliance with medications, not exercising regularly, and monitoring blood glucose.  The patient has been measuring capillary blood glucose before breakfast, after breakfast, after lunch, and after dinner.  Since the last visit, the patient reports having had eye care by an ophthalmologist and no foot care.    Pt has dog scratch on R side of face from their dog.   Pt cancelled foot appoint and is rescheduling that along with podiatry and mammogram and GI.  Hypertension Follow-Up      The patient also presents for Hypertension follow-up.  The patient denies lightheadedness, urinary frequency, headaches, edema, impotence, rash, and fatigue.  The patient denies the following associated symptoms: chest pain, chest pressure, exercise intolerance, dyspnea, palpitations, syncope, leg edema, and pedal edema.  Compliance with medications (by patient report) has been sporadic.   The patient reports that dietary compliance has been good.  The patient reports no exercise.  Adjunctive measures currently used by the patient include salt restriction.      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 [place]        Mother: DM       Siblings:none   Social History:    Reviewed history from 12/30/2007 and no changes required:       no diet       Retired       Married       Drug use-no       Regular exercise-no       Never Smoked       Alcohol use-no   Risk Factors:  Tobacco use:  never Drug use:  no Caffeine use:  1 drinks per day Alcohol use:  no Exercise:  no Seatbelt use:  100 %  Family History Risk Factors:    Family History of MI in females < 66 years old:  no    Family History of MI in males < 84 years old:  no   Review of Systems      See HPI  General      Denies chills, fatigue, fever, loss of appetite, malaise, sleep disorder, sweats, weakness, and weight loss.  Eyes      Denies blurring, discharge, double vision, eye irritation, eye pain, halos, itching, light sensitivity, red eye, vision loss-1 eye, and vision loss-both eyes.      optho q1y  ENT      Denies decreased hearing, difficulty swallowing, ear discharge, earache, hoarseness, nasal congestion, nosebleeds, postnasal drainage, ringing in ears, sinus pressure, and sore throat.      dentist-- last visit 3-4 years  CV      Denies bluish discoloration of lips or nails, chest pain or discomfort, difficulty breathing at  night, difficulty breathing while lying down, fainting, fatigue, leg cramps with exertion, lightheadness, near fainting, palpitations, shortness of breath with exertion, swelling of feet, swelling of hands, and weight gain.  Resp      Denies chest discomfort, chest pain with inspiration, cough, coughing up blood, excessive snoring, hypersomnolence, morning headaches, pleuritic, shortness of breath, sputum productive, and wheezing.  GI      Denies abdominal pain, bloody stools, change in bowel habits, constipation, dark tarry stools, diarrhea, excessive appetite, gas, hemorrhoids, indigestion, loss of appetite, nausea, vomiting, vomiting blood, and yellowish skin color.  GU      Denies abnormal vaginal bleeding, decreased libido, discharge, dysuria, genital sores, hematuria, incontinence, nocturia, urinary frequency, and urinary hesitancy.  MS      Complains of joint pain and muscle aches.      Denies joint redness, joint swelling, loss of strength, low back pain, mid back pain, muscle , cramps, muscle weakness, stiffness, and thoracic pain.      pain in feet  Derm      Denies changes in color of skin, changes in nail beds, dryness, excessive perspiration, flushing, hair loss, insect bite(s), itching, lesion(s), poor wound healing, and rash.  Neuro      Denies brief paralysis, difficulty with concentration, disturbances in coordination, falling down, headaches, inability to speak, memory loss, numbness, poor balance, seizures, sensation of room spinning, tingling, tremors, visual disturbances, and weakness.  Psych      Denies alternate hallucination ( auditory/visual), anxiety, depression, easily angered, easily tearful, irritability, mental problems, panic attacks, sense of great danger, suicidal thoughts/plans, thoughts of violence, unusual visions or sounds, and thoughts /plans of harming others.  Endo      Denies cold intolerance, excessive hunger, excessive thirst, excessive urination,  heat intolerance, polyuria, and weight change.  Heme      Denies abnormal bruising, bleeding, enlarge lymph nodes, fevers, pallor, and skin discoloration.  Allergy      Denies hives or rash, itching eyes, persistent infections, seasonal allergies, and sneezing.   Physical Exam  General:     Well-developed,well-nourished,in no acute distress; alert,appropriate and cooperative throughout examination Head:     Normocephalic and atraumatic without obvious abnormalities. No apparent alopecia or balding. Eyes:     vision grossly intact, pupils equal, pupils round, and pupils reactive to light.   Ears:     External ear  exam shows no significant lesions or deformities.  Otoscopic examination reveals clear canals, tympanic membranes are intact bilaterally without bulging, retraction, inflammation or discharge. Hearing is grossly normal bilaterally. Nose:     External nasal examination shows no deformity or inflammation. Nasal mucosa are pink and moist without lesions or exudates. Mouth:     Oral mucosa and oropharynx without lesions or exudates.  Teeth in good repair. Neck:     No deformities, masses, or tenderness noted. Chest Wall:     No deformities, masses, or tenderness noted. Breasts:     No mass, nodules, thickening, tenderness, bulging, retraction, inflamation, nipple discharge or skin changes noted.   Lungs:     Normal respiratory effort, chest expands symmetrically. Lungs are clear to auscultation, no crackles or wheezes. Heart:     Normal rate and regular rhythm. S1 and S2 normal without gallop, murmur, click, rub or other extra sounds. Abdomen:     Bowel sounds positive,abdomen soft and non-tender without masses, organomegaly or hernias noted. Msk:     normal ROM, no joint tenderness, no joint swelling, no joint warmth, no redness over joints, no joint deformities, no joint instability, and no crepitation.    Diabetes Management Exam:    Foot Exam (with socks and/or shoes  not present):       Sensory-Pinprick/Light touch:          Left medial foot (L-4): normal          Left dorsal foot (L-5): normal          Left lateral foot (S-1): normal       Sensory-Monofilament:          Left foot: normal       Inspection:          Left foot: normal       Nails:          Left foot: normal    Eye Exam:       Eye Exam done elsewhere    Impression & Recommendations:  Problem # 1:  PREVENTIVE HEALTH CARE (ICD-V70.0)  Orders: EKG w/ Interpretation (93000) labs reviewed with pt check mamo and colon    Problem # 2:  OTHER SPECIFIED INJURY CAUSED BY ANIMAL (ICD-E906.8) abx ad jeeo ckean wiykd  Complete Medication List: 1)  Ultram 50 Mg Tabs (Tramadol hcl) .... Take 1 tablet by mouth as directed 2)  Lyrica 50 Mg Caps (Pregabalin) .Marland Kitchen.. 1 once daily 3)  Glyburide 5 Mg Tabs (Glyburide) .... 2 tabs by mouth two times a day 4)  Cymbalta 60 Mg Cpep (Duloxetine hcl) .Marland Kitchen.. 1 once daily 5)  Actos 45 Mg Tabs (Pioglitazone hcl) .Marland Kitchen.. 1 once daily 6)  Lipitor 80 Mg Tabs (Atorvastatin calcium) .Marland Kitchen.. 1 at bedtime 7)  Gemfibrozil 600 Mg Tabs (Gemfibrozil) .Marland Kitchen.. 1 by mouth two times a day 8)  Glyburide 5 Mg Tabs (Glyburide) .... 2 by mouth two times a day 9)  Vicodin Es 7.5-750 Mg Tabs (Hydrocodone-acetaminophen) .Marland Kitchen.. 1 by mouth every 6 hours as needed 10)  Diovan 160 Mg Tabs (Valsartan) .Marland Kitchen.. 1 once daily 11)  Onglyza 5 Mg Tabs (Saxagliptin hcl) .... Take 1 tabs 12)  Augmentin 875-125 Mg Tabs (Amoxicillin-pot clavulanate) .Marland Kitchen.. 1 by mouth two times a day 13)  Nexium 40 Mg Cpdr (Esomeprazole magnesium) .Marland Kitchen.. 1 by mouth qd 14)  Vitamin D 13086 Unit Caps (Ergocalciferol) .... Take 1 tab weekly  Other Orders: Tdap => 88yrs IM (57846) Admin 1st Vaccine (96295)    ]  EKG  Procedure date:  07/13/2008  Findings:      sinus rhythm 78 bpm  low voltage reverse R wave progression   EKG  Procedure date:  07/13/2008  Findings:      sinus rhythm 78 bpm  low  voltage reverse R wave progression    Tetanus/Td Vaccine    Vaccine Type: Tdap    Site: right deltoid    Mfr: GlaxoSmithKline    Dose: 0.5 ml    Route: IM    Given by: Jeremy Johann CMA    Exp. Date: 07/02/2010    Lot #: [id number]    VIS given: 06/04/07 version given July 13, 2008.
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
## 2010-08-16 NOTE — Letter (Signed)
Summary: Patient Encounter Notification/[facility]  Patient Encounter Notification/[facility]   Imported By: Lanelle Bal 05/24/2009 12:09:35  _____________________________________________________________________  External Attachment:    Type:   Image     Comment:   External Document

## 2010-08-16 NOTE — Progress Notes (Signed)
Summary: INSURANCE INFORMATION  Phone Note Outgoing Call   Call placed by: Magdalen Spatz [facility],  November 11, 2008 11:18 AM Call placed to: Insurer Summary of Call: FYI FOR PT'S CHART....Marland KitchenMarland KitchenIN ORDER FOR PT TO BE APPROVED TO SEE LOCAL PROVIDERS (IN [place]), PER AUDREY W/[employer] ON 10-21-08 (REF# [id number]), LETTERS OF MEDICAL NECESSITY MUST BE MAILED/NOT FAXED TO [employer]RES[employer]OND[employer], [address] [address], [place] [address].  I MAILED LETTERS OF MED NEC FROM DR. LOWNE & DR. Gerlene Fee, ALONG WITH ALL CLINICAL NOTES TODAY.  ONCE REC'D THERE IS A 15 TO 30 DAY TURN-AROUND TO GET AN ANSWER Magdalen Spatz [facility]  November 11, 2008 11:17 AM Initial call taken by: Magdalen Spatz [facility],  November 11, 2008 11:19 AM

## 2010-08-16 NOTE — Letter (Signed)
Summary: Results Follow up Letter  [facility] [place]  [address] [place], [place] [address]   Phone: [phone]  Fax: [phone]    05/28/2009 MRN: [id number]  [facility] [address] [place], [place]  [address]  Dear Ms. Sharpnack,  The following are the results of your recent test(s):  Test         Result    Pap Smear:        Normal _____  Not Normal _____ Comments: ______________________________________________________ Cholesterol: LDL(Bad cholesterol):         Your goal is less than:         HDL (Good cholesterol):       Your goal is more than: Comments:  ______________________________________________________ Mammogram:        Normal _____  Not Normal _____ Comments:  ___________________________________________________________________ Hemoccult:        Normal _____  Not normal _______ Comments:    _____________________________________________________________________ Other Tests:  See attachment for results.   We routinely do not discuss normal results over the telephone.  If you desire a copy of the results, or you have any questions about this information we can discuss them at your next office visit.   Sincerely,   Army Fossa CMA  May 28, 2009 4:57 PM

## 2010-08-16 NOTE — Progress Notes (Signed)
Summary: not better today  Phone Note Call from Patient Call back at Home Phone [phone]   Caller: Patient Summary of Call: patient said she was feeling better but today she is having burning when urinating - rx for cipro was called in was patient to be taking this - if so i dont believe she picked it up -  Initial call taken by: [name] [place],  March 30, 2010 8:40 AM  Follow-up for Phone Call        Pt has not picked up the prescription yet, adv to go and pick up the meds and take them and if no better once done, then she will need a f/u visit. Pt voiced understanding. Follow-up by: Almeta Monas CMA Duncan Dull),  March 30, 2010 1:10 PM

## 2010-08-16 NOTE — Assessment & Plan Note (Signed)
Summary: NEW ENDO HU[employer] PPO--DMII-RENEE/DR LOWNE--#--STC   Vital Signs:  Patient profile:   55 year old female Height:      66 inches (167.64 cm) Weight:      226.13 pounds (102.79 kg) O2 Sat:      97 % on Room air Temp:     97.7 degrees F (36.50 degrees C) oral Pulse rate:   71 / minute BP sitting:   128 / 80  (left arm) Cuff size:   large  Vitals Entered By: Josph Macho CMA (July 05, 2009 3:34 PM)  O2 Flow:  Room air CC: New Endo: Diabetic Management/ CF Is Patient Diabetic? Yes   Referring Provider:  Loreen Freud DO Primary Provider:  Laury Axon  CC:  New Endo: Diabetic Management/ CF.  History of Present Illness: pt states 8 years h/o dm.  she denies knowing of any chronic complications.  she has never been on insulin.  she takes 3 oral meds.  no cbg record, but states cbg's are well-controlled. pt says her diet and exercise are poor.   symptomatically, pt states 6 months of pain at the left foot, and intermittent associated numbness.  she also has few weeks of intermittent ruq pain. metformin was stopped due to diarrhea, but in retorspect, pt now thinks that might have been a coincidence.     Current Medications (verified): 1)  Glyburide 5 Mg  Tabs (Glyburide) .... 2 Tabs By Mouth Two Times A Day 2)  Actos 45 Mg  Tabs (Pioglitazone Hcl) .Marland Kitchen.. 1 Once Daily 3)  Lipitor 80 Mg  Tabs (Atorvastatin Calcium) .Marland Kitchen.. 1 At Bedtime 4)  Glyburide 5 Mg  Tabs (Glyburide) .... 2 By Mouth Two Times A Day 5)  Diovan 160 Mg  Tabs (Valsartan) .Marland Kitchen.. 1 Once Daily 6)  Onglyza 5 Mg Tabs (Saxagliptin Hcl) .... Take 1 Tabs 7)  Zoloft 100 Mg Tabs (Sertraline Hcl) .... 2 By Mouth Once Daily 8)  Triamcinolone Acetonide 0.1 % Oint (Triamcinolone Acetonide) .... Apply To Affected Area Two Times A Day.  Disp 1 Large Tube. 9)  Fluconazole 150 Mg Tabs (Fluconazole) .Marland Kitchen.. 1 By Mouth X1 , May Repeat in 1 Week As Needed 10)  Lotrimin Af 1 % Crea (Clotrimazole) .... 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 Father:arthritis, ? [place]  Mother: DM (deceased) Siblings:none   Social History: Reviewed history from 07/13/2008 and no changes required. no diet Retired Married Drug use-no Regular exercise-no Never Smoked Alcohol use[place]no from [place]  Review of Systems       The patient complains of depression.         denies weight loss, blurry vision, headache, sob, n/v, urinary frequency, cramps, excessive diaphoresis, memory loss, and hypoglycemia. she reports pain in the hands (cts), and easy bruising.  she recently had w/u for chest pain (neg).  Physical Exam  General:  obese.  no distress  Head:  head: no deformity eyes: no periorbital swelling, no proptosis external nose and ears are normal mouth: no lesion seen Neck:  Supple without thyroid enlargement or tenderness. No cervical lymphadenopathy Lungs:  Clear to auscultation bilaterally. Normal respiratory effort.  Heart:  Regular rate and rhythm without murmurs or gallops noted. Normal S1,S2.   Abdomen:  abdomen is soft, nontender.  no hepatosplenomegaly.   not distended.  no hernia  Msk:  muscle bulk and strength are grossly normal.  no obvious joint swelling.  gait is normal and steady  Pulses:  dorsalis pedis intact bilat.  no carotid bruit Extremities:  no deformity.  no ulcer on the feet.  feet are of normal color and temp.  no edema  Neurologic:  cn 2-12 grossly intact.   readily moves all 4's.   sensation is intact to touch on the feet  Skin:  normal texture and temp.  no rash.  not diaphoretic  Cervical Nodes:  No significant adenopathy.  Psych:  Alert and cooperative; normal mood and affect; normal attention span and  concentration.   Additional Exam:  Hemoglobin A1C       [H]  7.3 %        Impression & Recommendations:  Problem # 1:  DIABETES MELLITUS, TYPE II (ICD-250.00) she has a combination of elev a1c and hypoglyemia, so this is mismatching of her rx to her needs  Problem # 2:  diarrhea ? was due to metformin  Problem # 3:  ABDOMINAL PAIN (ICD-789.00) uncertain etiology  Problem # 4:  foot pain ? neuropathic  Medications Added to Medication List This Visit: 1)  Glyburide 5 Mg Tabs (Glyburide) .Marland Kitchen.. 1 tab by mouth two times a day 2)  Metformin Hcl 500 Mg Xr24h-tab (Metformin hcl) .Marland Kitchen.. 1 qam  Other Orders: Radiology Referral (Radiology) TLB-CBC Platelet - w/Differential (85025-CBCD) TLB-Hepatic/Liver Function Pnl (80076-HEPATIC) TLB-Amylase (82150-AMYL) Consultation Level IV ([address])  Patient Instructions: 1)  we discussed the importance of diet and exercise therapy and the risks of diabetes.  you should see an eye doctor every year. 2)  it is very important to keep good control of blood pressure and cholesterol, especially in those with diabetes.  please discuss these with your doctor.  you should take an aspirin every day, unless you have been advised by a doctor not to. 3)  check your blood sugar 1 time a day.  vary the time of day when you check, between before the 3 meals, and at bedtime.  also check if you have symptoms of your blood sugar being too high or too low.  please keep a record of the readings and bring it to your next appointment here.  please call us sooner if you are having low blood sugar episodes. 4)  i told pt we will need to take this complex situation in stages 5)  blood tests today. 6)  i requested ultrasound.  you will be called about an appointment. 7)  reduce glyburide to 5 mg two times a day. 8)  start metformin 500 mg each am. 9)  Please schedule a follow-up appointment in 2 weeks. Prescriptions: METFORMIN HCL 500 MG XR24H-TAB (METFORMIN HCL) 1 qam  #30 x  11   Entered and Authorized by:   Minus Breeding MD   Signed by:   Minus Breeding MD on 07/05/2009   Method used:   Electronically to        [facility] (retail)       [address]       [place], [place]  [address]       Ph: [phone]       Fax: [phone]   RxID:   [id number]

## 2010-08-16 NOTE — Assessment & Plan Note (Signed)
Summary: ROV./ GD  Medications Added METFORMIN HCL 500 MG XR24H-TAB (METFORMIN HCL) once daily at 2pm NEXIUM 40 MG CPDR (ESOMEPRAZOLE MAGNESIUM) two times a day      Allergies Added: NKDA  Visit Type:  Follow-up Referring Provider:  Loreen Freud DO Primary Provider:  Laury Axon  CC:  chest tightness at night very short -- left leg numbing.  History of Present Illness: Donna Pham is a 55 y/o woman who saw last in February 2008 for CP. She is referred back by Dr. Laury Axon fro re-evaluation of CP.  Has h/o HTN, HL and DM2. She has a h/o recurrent CP.  She had heart cath about 10 years ago in [facility] with normal cors. Had myoview in 2007 and again in Nov 2010 after present to ER with chest pain. EF 70%. No ischemia/infarct.   Main complaint is she has pain in legs at night. No frank claudication. She is very worried about PAD.  Says a couple times a month chest feels tight. Often happens while in recliner. Denies associated SOB or n/v.  No real change. Usually happens when she is under stress as her father and husband are sick.  Feels very fatigued and occasioanlly has night sweats. Husband says she snores. +daytime somnolence + severe reflux. Several months ago was told she would need gallbladder out but signs better after she changed her diet. Taking nexium at night.   Problems Prior to Update: 1)  Limb Pain  (ICD-729.5) 2)  Limb Pain  (ICD-729.5) 3)  Fatigue / Malaise  (ICD-780.79) 4)  Chest Pain Unspecified  (ICD-786.50) 5)  Hypertension  (ICD-401.9) 6)  Hyperlipidemia  (ICD-272.4) 7)  Gerd  (ICD-530.81) 8)  Abdominal Pain  (ICD-789.00) 9)  Diabetes Mellitus, Type II  (ICD-250.00) 10)  Depression  (ICD-311) 11)  Pneumonia, Right Lower Lobe  (ICD-486) 12)  Back Pain  (ICD-724.5) 13)  Dysuria  (ICD-788.1) 14)  Cellulitis/abscess, Arm  (ICD-682.3) 15)  Acute Bronchitis  (ICD-466.0) 16)  Plantar Fasciitis, Left  (ICD-728.71) 17)  Contact Dermatitis&oth Eczema Due Oth Spec Agent   (ICD-692.89) 18)  Carpal Tunnel Syndrome, Bilateral, Hx of  (ICD-V12.49) 19)  Shoulder Strain  (ICD-840.9) 20)  Mole  (ICD-216.9) 21)  Plantar Wart, Left  (ICD-078.12) 22)  Vaginitis, Candidal  (ICD-112.1) 23)  Bite of Nonvenomous Arthropod  (ICD-E906.4) 24)  Other Specified Injury Caused By Animal  (ICD-E906.8) 25)  Candidiasis of Unspecified Site  (ICD-112.9) 26)  Uns Advrs Eff Uns Rx Medicinal&biological Sbstnc  (ICD-995.20) 27)  Preventive Health Care  (ICD-V70.0)  Current Medications (verified): 1)  Actos 45 Mg  Tabs (Pioglitazone Hcl) .Marland Kitchen.. 1 Once Daily 2)  Lipitor 80 Mg  Tabs (Atorvastatin Calcium) .Marland Kitchen.. 1 At Bedtime 3)  Diovan 160 Mg  Tabs (Valsartan) .Marland Kitchen.. 1 Once Daily 4)  Onglyza 5 Mg Tabs (Saxagliptin Hcl) .... Take 1 Tabs 5)  Zoloft 100 Mg Tabs (Sertraline Hcl) .... 2 By Mouth Once Daily 6)  Glyburide 2.5 Mg Tabs (Glyburide) .Marland Kitchen.. 1 Bid 7)  Metformin Hcl 500 Mg Xr24h-Tab (Metformin Hcl) .... Once Daily At 2pm 8)  Freestyle Test  Strp (Glucose Blood) .... Check Two Times A Day.  Allergies (verified): No Known Drug Allergies  Past History:  Past Surgical History: Last updated: 12/30/2007 G6 P 2  Family History: Last updated: 07/05/2009 Father:arthritis, ? [place]  Mother: DM (deceased) Siblings:none   Social History: Last updated: 08/10/2009 no diet Retired Married Drug use-no Regular exercise-no Never Smoked Alcohol use-no originally from [place]  Risk Factors: Alcohol Use: <  1 (05/14/2009) Caffeine Use: 1 (05/14/2009) Exercise: no (05/14/2009)  Risk Factors: Smoking Status: never (05/14/2009)  Past Medical History: 1. Chest pain    --cath 10 years ago ok    --myoview 2007 and 05/2009: normal 2. Depression 3. Diabetes mellitus, type II 4. GERD 5. Hypertension 6. Hyperlipidemia 7. 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 Father:arthritis, ? [place]  Mother: DM  (deceased) Siblings:none   Social History: Reviewed history from 08/10/2009 and no changes required. no diet Retired Married Drug use-no Regular exercise-no Never Smoked Alcohol use-no originally from [place]  Review of Systems       As per HPI and past medical history; otherwise all systems negative.   Vital Signs:  Patient profile:   55 year old female Height:      66 inches Weight:      226 pounds BMI:     36.61 Pulse rate:   74 / minute BP sitting:   142 / 94  (left arm) Cuff size:   regular  Vitals Entered By: Hardin Negus, RMA (October 28, 2009 11:38 AM)  Physical Exam  General:  Gen: obese. no resp difficulty HEENT: normal Neck: supple. no JVD. Carotids 2+ bilat; no bruits. No lymphadenopathy or thryomegaly appreciated. Cor: PMI nondisplaced. Regular rate & rhythm. No rubs, murmur. +s4 Lungs: clear Abdomen: obese. soft, nontender, nondistended. Good bowel sounds. Extremities: no cyanosis, clubbing, rash, edema Neuro: alert & orientedx3, cranial nerves grossly intact. moves all 4 extremities w/o difficulty. affect pleasant    Impression & Recommendations:  Problem # 1:  CHEST PAIN UNSPECIFIED (ICD-786.50) Given previous cath and recent stress tests doubt this is angina. We did talk about possibility of repet cardiac cath but will defer at this point unless symptoms getting worse. Will try doubling nexium to 40 two times a day. May also benefit from low-dose klonopin to help with stress.  Problem # 2:  LIMB PAIN (ICD-729.5) She is very concerned about PAD. Will check ABIs.  Problem # 3:  FATIGUE / MALAISE (ICD-780.79) Suspect she has severe OSA. Will refer to pulmonary for evalaution and possible sleep study.   Other Orders: Pulmonary Referral (Pulmonary) Arterial Duplex Lower Extremity (Arterial Duplex Low)  Patient Instructions: 1)  Your physician has requested that you have an ankle brachial index (ABI). During this test an ultrasound and blood  pressure cuff are used to evaluate the arteries that supply the arms and legs with blood. Allow thirty minutes for this exam. There are no restrictions or special instructions. 2)  You have been referred to Pulmonary 3)  Increase Nexium to 40mg  two times a day  4)  Follow up in 6 months Prescriptions: NEXIUM 40 MG CPDR (ESOMEPRAZOLE MAGNESIUM) two times a day  #60 x 6   Entered by:   Meredith Staggers, RN   Authorized by:   Dolores Patty, MD, [facility]   Signed by:   Meredith Staggers, RN on 10/28/2009   Method used:   Electronically to        [facility] [id number] (retail)       [address] [place], [place]  [address]       Ph: [phone]       Fax: [phone]   RxID:   [id number]

## 2010-08-16 NOTE — Letter (Signed)
Summary: Results Follow-up Letter  [place] [facility]  [address] [place], [place] [address]   Phone: [phone]  Fax: [phone]    07/29/2008        Donna Pham [address] [place], [place]  [address]  Dear [name],   The following are the results of your recent test(s):  Test     Result     Pap Smear    Normal_______  Not Normal_____       Comments: _________________________________________________________ Cholesterol LDL(Bad cholesterol):          Your goal is less than:         HDL (Good cholesterol):        Your goal is more than: _________________________________________________________ Other Tests:   _________________________________________________________  Please call for an appointment Or Please see attached labs._________________________________________________________ _________________________________________________________ _________________________________________________________  Sincerely,  Felecia Deloach CMA [place] at [facility]

## 2010-08-16 NOTE — Assessment & Plan Note (Signed)
Summary: cpx &lab/cbs   Vital Signs:  Patient Profile:   55 Years Old Female Height:     64 inches Weight:      224.6 pounds Temp:     97.9 degrees F oral Pulse rate:   74 / minute BP sitting:   122 / 70  (left arm)  Pt. in pain?   no  Vitals Entered By: Jeremy Johann CMA (July 02, 2008 9:18 AM)                  PCP:  Laury Axon  Chief Complaint:  COUGH, CHEST CONGESTION, and URI symptoms.  History of Present Illness:  URI Symptoms      This is a 55 year old woman who presents with URI symptoms.  The symptoms began duration > 3 days ago.  Pt here for cpe but is sick with cough and congestion.  The patient reports nasal congestion, purulent nasal discharge, and productive cough, but denies clear nasal discharge, earache, and sick contacts.  The patient denies fever, low-grade fever (<100.5 degrees), fever of 100.5-103 degrees, fever of 103.1-104 degrees, fever to >104 degrees, stiff neck, dyspnea, wheezing, rash, vomiting, diarrhea, use of an antipyretic, and response to antipyretic.  The patient also reports headache, muscle aches, and severe fatigue.  The patient denies itchy watery eyes, itchy throat, sneezing, seasonal symptoms, and response to antihistamine.  The patient denies the following risk factors for Strep sinusitis: unilateral facial pain, unilateral nasal discharge, poor response to decongestant, double sickening, tooth pain, Strep exposure, tender adenopathy, and absence of cough.  Pt c/o B/L sinus pressure.  She has been taking dayquil and nyquil or tylenol pm.      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 [place]        Mother: DM       Siblings:none   Social History:    Reviewed history from 12/30/2007 and no changes required:       no diet   Risk Factors: Tobacco use:  never Alcohol use:  no Exercise:  yes    Type:  minimal   Review of Systems      See HPI   Physical Exam  General:     alert and overweight-appearing.   Ears:     External ear exam shows no significant lesions or deformities.  Otoscopic examination reveals clear canals, tympanic membranes are intact bilaterally without bulging, retraction, inflammation or discharge. Hearing is grossly normal bilaterally. Nose:     External nasal examination shows no deformity or inflammation. Nasal mucosa are pink and moist without lesions or exudates. Mouth:     Oral mucosa and oropharynx without lesions or exudates.  Teeth in good repair. Neck:     No deformities, masses, or tenderness noted. Lungs:     R decreased breath sounds and L decreased breath sounds.  Heart:     normal rate and regular rhythm.   Skin:     Intact without suspicious lesions or rashes Cervical Nodes:     No lymphadenopathy noted Psych:     Oriented X3, memory intact for recent and remote, and normally interactive.      Impression & Recommendations:  Problem # 1:  BRONCHITIS-ACUTE (ICD-466.0)  Her updated medication list for this problem includes:    Augmentin 875-125 Mg Tabs (Amoxicillin-pot clavulanate) .Marland Kitchen... 1 by mouth two times a day  Mucinex  DM  Take antibiotics and other medications as directed. Encouraged to push clear liquids, get enough rest, and take acetaminophen as needed. To be seen in 5-7 days if no improvement, sooner if worse.   Problem # 2:  GERD (ICD-530.81)  The following medications were removed from the medication list:    Prevacid 15 Mg Cpdr (Lansoprazole) .Marland Kitchen... Take one tablet daily  Her updated  medication list for this problem includes:    Nexium 40 Mg Cpdr (Esomeprazole magnesium) .Marland Kitchen... 1 by mouth qd  Orders: Venipuncture ([address]) TLB-Lipid Panel (80061-LIPID) TLB-BMP (Basic Metabolic Panel-BMET) (80048-METABOL) TLB-CBC Platelet - w/Differential (85025-CBCD) TLB-Hepatic/Liver Function Pnl (80076-HEPATIC) TLB-TSH (Thyroid Stimulating Hormone) (84443-TSH) T-Vitamin D (25-Hydroxy) ([address]) TLB-A1C / Hgb A1C (Glycohemoglobin) (83036-A1C) TLB-Microalbumin/Creat Ratio, Urine (82043-MALB) Gastroenterology Referral (GI)  Diagnostics Reviewed:  Discussed lifestyle modifications, diet, antacids/medications, and preventive measures. Handout provided.   Complete Medication List: 1)  Ultram 50 Mg Tabs (Tramadol hcl) .... Take 1 tablet by mouth as directed 2)  Lyrica 50 Mg Caps (Pregabalin) .Marland Kitchen.. 1 once daily 3)  Glyburide 5 Mg Tabs (Glyburide) .... 2 tabs by mouth two times a day 4)  Cymbalta 60 Mg Cpep (Duloxetine hcl) .Marland Kitchen.. 1 once daily 5)  Actos 45 Mg Tabs (Pioglitazone hcl) .Marland Kitchen.. 1 once daily 6)  Lipitor 80 Mg Tabs (Atorvastatin calcium) .Marland Kitchen.. 1 at bedtime 7)  Gemfibrozil 600 Mg Tabs (Gemfibrozil) .Marland Kitchen.. 1 by mouth two times a day 8)  Glyburide 5 Mg Tabs (Glyburide) .... 2 by mouth two times a day 9)  Vicodin Es 7.5-750 Mg Tabs (Hydrocodone-acetaminophen) .Marland Kitchen.. 1 by mouth every 6 hours as needed 10)  Diovan 160 Mg Tabs (Valsartan) .Marland Kitchen.. 1 once daily 11)  Onglyza 5 Mg Tabs (Saxagliptin hcl) .... Take 1 tabs 12)  Augmentin 875-125 Mg Tabs (Amoxicillin-pot clavulanate) .Marland Kitchen.. 1 by mouth two times a day 13)  Nexium 40 Mg Cpdr (Esomeprazole magnesium) .Marland Kitchen.. 1 by mouth qd  Other Orders: Radiology Referral (Radiology)    Prescriptions: NEXIUM 40 MG CPDR (ESOMEPRAZOLE MAGNESIUM) 1 by mouth qd  #30 x 0   Entered and Authorized by:   Loreen Freud DO   Signed by:   Loreen Freud DO on 07/02/2008   Method used:   Historical   RxID:   [id number] ONGLYZA 5 MG TABS (SAXAGLIPTIN HCL) TAKE  1 TABS  #30 x 2   Entered and Authorized by:   Loreen Freud DO   Signed by:   Loreen Freud DO on 07/02/2008   Method used:   Electronically to        [id number] (retail)       [address] [place], [place]  [address]       Ph: [phone]       Fax: [phone]   RxID:   [id number] GLYBURIDE 5 MG  TABS (GLYBURIDE) 2 tabs by mouth two times a day  #120 x 2   Entered  and Authorized by:   Loreen Freud DO   Signed by:   Loreen Freud DO on 07/02/2008   Method used:   Electronically to        [id number] (retail)       [address] [place], [place]  [address]       Ph: [phone]       Fax: [phone]   RxID:   [id number] AUGMENTIN 875-125 MG TABS (AMOXICILLIN-POT CLAVULANATE) 1 by mouth two times a day  #20 x 0   Entered and Authorized by:   Loreen Freud DO   Signed by:   Loreen Freud DO on 07/02/2008   Method used:   Electronically to        [id number] (retail)       [address] [place], [place]  [address]       Ph: [phone]       Fax: [phone]   RxID:   [id number] GLYBURIDE 5 MG  TABS (GLYBURIDE) 2 tabs by mouth two times a day  #120 x 2   Entered and Authorized by:   Loreen Freud DO   Signed by:   Loreen Freud DO on 07/02/2008   Method used:   Electronically to        [facility] (retail)       [address]       [place], [place]  [address]       Ph: [phone]       Fax: [phone]   RxID:   [id number]  ]  Appended Document: cpx &lab/cbs  Laboratory Results   Urine Tests   Date/Time Reported: July 02, 2008 10:24 AM   Routine Urinalysis   Color: yellow Appearance: Clear Glucose: negative   (Normal Range: Negative) Bilirubin: negative   (Normal Range: Negative) Ketone: negative   (Normal Range: Negative) Spec. Gravity: 1.015   (Normal Range: 1.003-1.035) Blood: negative   (Normal Range: Negative) pH:  6.0   (Normal Range: 5.0-8.0) Protein: negative   (Normal Range: Negative) Urobilinogen: negative   (Normal Range: 0-1) Nitrite: negative   (Normal Range: Negative) Leukocyte Esterace: negative   (Normal Range: Negative)    CommentsFloydene Flock CMA  July 02, 2008 10:25 AM

## 2010-08-16 NOTE — Assessment & Plan Note (Signed)
Summary: Cardiology Nuclear Testing  Nuclear Med Background Indications for Stress Test: Evaluation for Ischemia, Post Hospital   History: Echo, Heart Catheterization, Myocardial Perfusion Study  History Comments: 4/07 MPS: EF=73%, (-) ischemia 10/10 Echo: EF=55-60% Heart Cath: 7-8 yrs ago([place]), NL per pt.  Symptoms: Chest Pain, Chest Pain with Exertion    Nuclear Pre-Procedure Cardiac Risk Factors: Hypertension, Lipids, NIDDM Caffeine/Decaff Intake: none NPO After: 8:00 AM Lungs: clear IV 0.9% NS with Angio Cath: 20g     IV Site: (R) AC IV Started by: Stanton Kidney EMT-P Chest Size (in) 42     Cup Size D     Height (in): 66 Weight (lb): 225 BMI: 36.45 Tech Comments: The patient states her carpal tunnel was hurting so she had to stop. The patient was switched to [place].  Nuclear Med Study 1 or 2 day study:  1 day     Stress Test Type:  Eugenie Birks Reading MD:  Arvilla Meres, MD     Referring MD:  Loann Quill Resting Radionuclide:  Technetium 44m Tetrofosmin     Resting Radionuclide Dose:  11 mCi  Stress Radionuclide:  Technetium 28m Tetrofosmin     Stress Radionuclide Dose:  32 mCi   Stress Protocol Exercise Time (min):  3:53 min     Max HR:  117 bpm     Predicted Max HR:  165 bpm  Max Systolic BP: 124 mm Hg     Percent Max HR:  70.91 %     METS: 5.6 Rate Pressure Product:  55732  Lexiscan: 0.4 mg   Stress Test Technologist:  Milana Na EMT-P     Nuclear Technologist:  Domenic Polite CNMT  Rest Procedure  Myocardial perfusion imaging was performed at rest 45 minutes following the intravenous administration of Myoview Technetium 24m Tetrofosmin.  Stress Procedure  The patient received IV Lexiscan 0.4 mg over 15-seconds.  Myoview injected at 30-seconds.  There were no significant changes with infusion.  Quantitative spect images were obtained after a 45 minute delay.  QPS Raw Data Images:  Normal; no motion artifact; normal heart/lung ratio. Stress Images:  There is  normal uptake in all areas. Rest Images:  Normal homogeneous uptake in all areas of the myocardium. Subtraction (SDS):  Normal Transient Ischemic Dilatation:  1.2  (Normal <1.22)  Lung/Heart Ratio:  .32  (Normal <0.45)  Quantitative Gated Spect Images QGS EDV:  70 ml QGS ESV:  21 ml QGS EF:  70 % QGS cine images:  Normal  Findings Normal nuclear study      Overall Impression  Exercise Capacity: Lexiscan study with no exercise. ECG Impression: Baseline: NSR; No significant ST segment change with Lexiscan. Overall Impression: Normal stress nuclear study.  Appended Document: Cardiology Nuclear Testing normal  Appended Document: Cardiology Nuclear Testing letter mailed to pt

## 2010-08-16 NOTE — Progress Notes (Signed)
Summary: actos rx   Phone Note Refill Request Message from:  Patient  Refills Requested: Medication #1:  ACTOS 45 MG  TABS 1 once daily pt called to say she needs refills on her Actos to [facility] on [address] and [place] she is out  rx faxed electronically  Initial call taken by: Kandice Hams,  September 10, 2008 9:16 AM      Prescriptions: ACTOS 45 MG  TABS (PIOGLITAZONE HCL) 1 once daily  #30 x 3   Entered by:   Kandice Hams   Authorized by:   Loreen Freud DO   Signed by:   Kandice Hams on 09/10/2008   Method used:   Faxed to ...       [facility] [address] #[id number]* (retail)       [address] [place], [place]  [address]       Ph: [phone]       Fax: [phone]   RxID:   [id number]

## 2010-08-16 NOTE — Miscellaneous (Signed)
Summary: Orders Update  Clinical Lists Changes  Orders: Added new Referral order of Orthopedic Surgeon Referral (Ortho Surgeon) - Signed

## 2010-08-16 NOTE — Assessment & Plan Note (Signed)
Summary: STILL HAS COUGH AND COLD///SPH   Vital Signs:  Patient profile:   55 year old female Weight:      218.6 pounds O2 Sat:      97 % on Room air Temp:     98.3 degrees F oral Pulse rate:   79 / minute Pulse rhythm:   regular BP sitting:   122 / 76  (left arm) Cuff size:   regular  Vitals Entered By: Almeta Monas CMA Duncan Dull) (April 11, 2010 1:34 PM)  O2 Flow:  Room air CC: still has cough and congestion, URI symptoms   History of Present Illness:       This is a 55 year old woman who presents with URI symptoms.  The symptoms began 1 week ago.  The patient complains of nasal congestion, purulent nasal discharge, productive cough, earache, and sick contacts, but denies clear nasal discharge, sore throat, and dry cough.  Associated symptoms include fever.  The patient also reports sneezing, headache, muscle aches, and severe fatigue.  Risk factors for Strep sinusitis include tooth pain.  The patient denies the following risk factors for Strep sinusitis: unilateral facial pain, unilateral nasal discharge, poor response to decongestant, double sickening, Strep exposure, tender adenopathy, and absence of cough.    Current Medications (verified): 1)  Actos 45 Mg  Tabs (Pioglitazone Hcl) .Marland Kitchen.. 1 Once Daily 2)  Lipitor 80 Mg  Tabs (Atorvastatin Calcium) .Marland Kitchen.. 1 At Bedtime 3)  Diovan 160 Mg  Tabs (Valsartan) .Marland Kitchen.. 1 Once Daily 4)  Onglyza 5 Mg Tabs (Saxagliptin Hcl) .... Take 1 Tabs 5)  Zoloft 100 Mg Tabs (Sertraline Hcl) .... 2 By Mouth Once Daily 6)  Glyburide 2.5 Mg Tabs (Glyburide) .Marland Kitchen.. 1 Bid 7)  Metformin Hcl 500 Mg Xr24h-Tab (Metformin Hcl) .... Once Daily At 2pm 8)  Freestyle Test  Strp (Glucose Blood) .... Check Two Times A Day. 9)  Nexium 40 Mg Cpdr (Esomeprazole Magnesium) .... Two Times A Day 10)  Tricor 145 Mg Tabs (Fenofibrate) .Marland Kitchen.. 1 By Mouth Daily 11)  Bayer Low Strength 81 Mg Tbec (Aspirin) .... Take Once Daily 12)  Alprazolam 0.25 Mg Tabs (Alprazolam) .Marland Kitchen.. 1 By Mouth  Three Times A Day As Needed  Allergies (verified): No Known Drug Allergies  Past History:  Past medical, surgical, family and social histories (including risk factors) reviewed for relevance to current acute and chronic problems.  Past Medical History: Reviewed history from 10/28/2009 and no changes required. 1. Chest pain    --cath 10 years ago ok    --myoview 2007 and 05/2009: normal 2. Depression 3. Diabetes mellitus, type II 4. GERD 5. Hypertension 6. Hyperlipidemia 7. 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 G6 P 2  Family History: Reviewed history from 07/05/2009 and no changes required. Father:arthritis, ? [place]  Mother: DM (deceased) Siblings:none   Social History: Reviewed history from 08/10/2009 and no changes required. no diet Retired Married Drug use-no Regular exercise-no Never Smoked Alcohol use-no [place]  Review of Systems      See HPI  Physical Exam  General:  Well-developed,well-nourished,in no acute distress; alert,appropriate and cooperative throughout examination Ears:  External ear exam shows no significant lesions or deformities.  Otoscopic examination reveals clear canals, tympanic membranes are intact bilaterally without bulging, retraction, inflammation or discharge. Hearing is grossly normal bilaterally. Nose:  mucosal erythema, mucosal edema, L frontal sinus tenderness, L maxillary sinus tenderness, R  frontal sinus tenderness, and R maxillary sinus tenderness.   Mouth:  Oral mucosa and oropharynx without lesions or exudates.  Teeth in good repair. Neck:  No deformities, masses, or tenderness noted. Lungs:  R wheezes and L wheezes.   Heart:  normal rate.   Psych:  Oriented X3 and normally interactive.     Impression & Recommendations:  Problem # 1:  SINUSITIS - ACUTE-NOS (ICD-461.9)  Her updated medication list for this  problem includes:    Ceftin 500 Mg Tabs (Cefuroxime axetil) .Marland Kitchen... 1 by mouth two times a day    Cheratussin Ac 100-10 Mg/2ml Syrp (Guaifenesin-codeine) .Marland Kitchen... 1-2 tsp by mouth at bedtime as needed    Nasonex 50 Mcg/act Susp (Mometasone furoate) .Marland Kitchen... 2 sprays each nostril once daily  Instructed on treatment. Call if symptoms persist or worsen.   Problem # 2:  BRONCHITIS- ACUTE (ICD-466.0)  Her updated medication list for this problem includes:    Ceftin 500 Mg Tabs (Cefuroxime axetil) .Marland Kitchen... 1 by mouth two times a day    Cheratussin Ac 100-10 Mg/80ml Syrp (Guaifenesin-codeine) .Marland Kitchen... 1-2 tsp by mouth at bedtime as needed  Take antibiotics and other medications as directed. Encouraged to push clear liquids, get enough rest, and take acetaminophen as needed. To be seen in 5-7 days if no improvement, sooner if worse.  Problem # 3:  UTI (ICD-599.0) Assessment: Improved  Her updated medication list for this problem includes:    Ceftin 500 Mg Tabs (Cefuroxime axetil) .Marland Kitchen... 1 by mouth two times a day  Complete Medication List: 1)  Actos 45 Mg Tabs (Pioglitazone hcl) .Marland Kitchen.. 1 once daily 2)  Lipitor 80 Mg Tabs (Atorvastatin calcium) .Marland Kitchen.. 1 at bedtime 3)  Diovan 160 Mg Tabs (Valsartan) .Marland Kitchen.. 1 once daily 4)  Onglyza 5 Mg Tabs (Saxagliptin hcl) .... Take 1 tabs 5)  Zoloft 100 Mg Tabs (Sertraline hcl) .... 2 by mouth once daily 6)  Glyburide 2.5 Mg Tabs (Glyburide) .Marland Kitchen.. 1 bid 7)  Metformin Hcl 500 Mg Xr24h-tab (Metformin hcl) .... Once daily at 2pm 8)  Freestyle Test Strp (Glucose blood) .... Check two times a day. 9)  Nexium 40 Mg Cpdr (Esomeprazole magnesium) .... Two times a day 10)  Tricor 145 Mg Tabs (Fenofibrate) .Marland Kitchen.. 1 by mouth daily 11)  Bayer Low Strength 81 Mg Tbec (Aspirin) .... Take once daily 12)  Alprazolam 0.25 Mg Tabs (Alprazolam) .Marland Kitchen.. 1 by mouth three times a day as needed 13)  Ceftin 500 Mg Tabs (Cefuroxime axetil) .Marland Kitchen.. 1 by mouth two times a day 14)  Cheratussin Ac 100-10 Mg/58ml  Syrp (Guaifenesin-codeine) .Marland Kitchen.. 1-2 tsp by mouth at bedtime as needed 15)  Nasonex 50 Mcg/act Susp (Mometasone furoate) .... 2 sprays each nostril once daily  Patient Instructions: 1)  Acute sinusitis symptoms for less than 10 days are not helped by antibiotics. Use warm moist compresses, and over the counter decongestants( only as directed). Call if no improvement in 5-7 days, sooner if increasing pain, fever, or new symptoms.  2)  Acute Bronchitis symptoms for less then 10 days are not  helped by antibiotics. Take over the counter cough medications. Call if no improvement in 5-7 days, sooner if increasing cough, fever, or new symptoms ( shortness of breath, chest pain) .  3)  use nasonex 2 sprays each nostril once daily  4)  use mucinex for cough during day and cheratussin at night Prescriptions: NASONEX 50 MCG/ACT SUSP (MOMETASONE FUROATE) 2 sprays each nostril once daily  #1 x 0  Entered and Authorized by:   Loreen Freud DO   Signed by:   Loreen Freud DO on 04/11/2010   Method used:   Historical   RxID:   [id number] CHERATUSSIN AC 100-10 MG/5ML SYRP (GUAIFENESIN-CODEINE) 1-2 tsp by mouth at bedtime as needed  #6 oz x 0   Entered and Authorized by:   Loreen Freud DO   Signed by:   Loreen Freud DO on 04/11/2010   Method used:   Print then Give to Patient   RxID:   [id number] CEFTIN 500 MG TABS (CEFUROXIME AXETIL) 1 by mouth two times a day  #20 x 0   Entered and Authorized by:   Loreen Freud DO   Signed by:   Loreen Freud DO on 04/11/2010   Method used:   Electronically to        [facility] (retail)       [address]       [place], [place]  [address]       Ph: [phone]       Fax: [phone]   RxID:   [id number]   Laboratory Results   Urine Tests    Routine Urinalysis   Color: lt. yellow Appearance: Clear Glucose: 250   (Normal Range: Negative) Bilirubin: negative   (Normal Range: Negative) Ketone: negative   (Normal  Range: Negative) Spec. Gravity: 1.025   (Normal Range: 1.003-1.035) Blood: negative   (Normal Range: Negative) pH: 6.0   (Normal Range: 5.0-8.0) Protein: trace   (Normal Range: Negative) Urobilinogen: 0.2   (Normal Range: 0-1) Nitrite: negative   (Normal Range: Negative) Leukocyte Esterace: negative   (Normal Range: Negative)

## 2010-08-16 NOTE — Progress Notes (Signed)
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   ACCORDING TO [employer], THE PATIENT ALREADY SEES PROVIDERS CLOSE ENOUGH TO HER HOME.  I CALLED TODAY & SPOKE W/PT'S SPOUSE, Donna Pham, INFORMED HIM OF ALL ABOVE, HE WAS APPRECIATIVE & UNDERSTANDING.  THEY WILL LET us KNOW IF THEY CHANGE INSURANCE IN THE FUTURE. Initial call taken by: Donna Pham [facility],  February 24, 2009 2:53 PM

## 2010-08-16 NOTE — Assessment & Plan Note (Signed)
Summary: fever,headache,chills and coughing--acute only--tl   Vital Signs:  Patient Profile:   55 Years Old Female Weight:      230.13 pounds Temp:     100.6 degrees F Pulse rate:   98 / minute Resp:     18 per minute BP sitting:   130 / 90  Pt. in pain?   yes    Location:   stomach    Intensity:   8    Type:       aching  Vitals Entered By: Kandice Hams (December 30, 2007 3:07 PM)                  PCP:  Laury Axon  Chief Complaint:  pt c/o cough, fever, chill, bodyaches sx started Friday  night 12/27/07, and Abdominal pain.  History of Present Illness:  Abdominal Pain      This is a 55 year old woman who presents with Abdominal pain.  The patient reports nausea, vomiting, and diarrhea, but denies constipation, melena, hematochezia, anorexia, and hematemesis.  The location of the pain is epigastric.  The pain is described as intermittent and cramping in quality.  Associated symptoms include fever.  The patient denies the following symptoms: weight loss, dysuria, chest pain, jaundice, dark urine, missed menstrual period, and vaginal bleeding.      Past Medical History:    Depression    Diabetes mellitus, type II    GERD    Hypertension    Hyperlipidemia    CTS  Past Surgical History:    G6 P 2   Family History:    Father:arthritis, ? [place]     Mother: DM    Siblings:none   Social History:    no diet   Risk Factors:  Tobacco use:  never Alcohol use:  no Exercise:  yes    Type:  minimal   Review of Systems  General      Complains of chills, fever, sweats, and weight loss.  Eyes      Denies blurring, double vision, and vision loss-both eyes.  ENT      Denies ear discharge, earache, nasal congestion, and sinus pressure.  CV      Complains of shortness of breath with exertion.      Denies chest pain or discomfort, difficulty breathing at night, difficulty breathing while lying down, swelling of feet, and swelling of hands.  Resp      Complains of cough.      Denies chest pain with inspiration, coughing up blood, pleuritic, and sputum productive.  GI      See HPI      Complains of indigestion, nausea, and vomiting.      Denies bloody stools, constipation, dark tarry stools, and diarrhea.      N&V intermittent with near syncope  GU      Denies discharge, hematuria, and urinary frequency.  MS      Complains of joint pain and low back pain.      Denies joint redness and joint swelling.  Derm      Denies changes in color of skin and rash.  Neuro      Complains of headaches.      Denies numbness and tingling.      frontal bilat  Psych      PMH depression  Endo      Complains of cold intolerance.      Denies excessive hunger, excessive thirst, excessive urination, heat intolerance, polyuria, and weight change.  Heme      Denies abnormal bruising and bleeding.   Physical Exam  General:     overweight-appearing and uncomfortable-appearing.   Head:     Normocephalic and atraumatic without obvious abnormalities. No apparent alopecia or balding. Eyes:     No corneal or conjunctival inflammation noted. Perrla. Funduscopic exam benign, without hemorrhages, exudates or papilledema.No icterus Ears:     External ear exam shows no significant lesions or deformities.  Otoscopic examination reveals clear canals, tympanic membranes are intact bilaterally without bulging, retraction, inflammation or discharge. Hearing is grossly normal bilaterally. Nose:     External nasal examination shows no deformity or inflammation. Nasal mucosa are pink and moist without lesions or exudates.Septal dislocation Mouth:     Oral mucosa and oropharynx without lesions or exudates.  Teeth in good repair.Mild erythema Neck:     No deformities, masses, or tenderness noted. Lungs:     Normal respiratory effort, chest expands symmetrically. Lungs are clear to auscultation, no crackles or wheezes. Heart:     regular rhythm and tachycardia.   Abdomen:      Bowel sounds positive but decreased,abdomen soft and diffusely tender without masses, organomegaly or hernias noted.  Pulses:     R and L carotid,radial pulses  are full and equal bilaterally. Decreased pedal pulses Extremities:     trace left pedal edema and trace right pedal edema.   Neurologic:     alert & oriented X3.  Profoundly weak  Skin:     Intact without suspicious lesions or rashes Cervical Nodes:     No lymphadenopathy noted Axillary Nodes:     No palpable lymphadenopathy Psych:     memory intact for recent and remote and normally interactive.  Acutely ill    Impression & Recommendations:  Problem # 1:  ABDOMINAL PAIN, EPIGASTRIC (ICD-789.06)  Problem # 2:  NAUSEA (ICD-787.02)  Problem # 3:  VOMITING (ICD-787.03)  Orders: Rapid Strep ([address]) Flu A+B ([address])   Problem # 4:  DIABETES MELLITUS, TYPE II (ICD-250.00)  Her updated medication list for this problem includes:    Glyburide 5 Mg Tabs (Glyburide) .Marland Kitchen... Take one tablet twice daily    Actos 45 Mg Tabs (Pioglitazone hcl) .Marland Kitchen... 1 once daily    Janumet 50-1000 Mg Tabs (Sitagliptin-metformin hcl) .Marland Kitchen... Take one tablet twice daily    Lisinopril 10 Mg Tabs (Lisinopril) .Marland Kitchen... 1 once daily    Glyburide 5 Mg Tabs (Glyburide) .Marland Kitchen... 2 by mouth two times a day   Complete Medication List: 1)  Robinul-forte 2 Mg Tabs (Glycopyrrolate) .Marland Kitchen.. 1 tablet by mouth twice a day 2)  Ultram 50 Mg Tabs (Tramadol hcl) .... Take 1 tablet by mouth as directed 3)  Prevacid 15 Mg Cpdr (Lansoprazole) .... Take one tablet daily 4)  Lyrica 50 Mg Caps (Pregabalin) .Marland Kitchen.. 1 once daily 5)  Glyburide 5 Mg Tabs (Glyburide) .... Take one tablet twice daily 6)  Cymbalta 60 Mg Cpep (Duloxetine hcl) .Marland Kitchen.. 1 once daily 7)  Actos 45 Mg Tabs (Pioglitazone hcl) .Marland Kitchen.. 1 once daily 8)  Janumet 50-1000 Mg Tabs (Sitagliptin-metformin hcl) .... Take one tablet twice daily 9)  Lisinopril 10 Mg Tabs (Lisinopril) .Marland Kitchen.. 1 once daily 10)  Lipitor 80 Mg Tabs  (Atorvastatin calcium) .Marland Kitchen.. 1 at bedtime 11)  Gemfibrozil 600 Mg Tabs (Gemfibrozil) .Marland Kitchen.. 1 by mouth two times a day 12)  Glyburide 5 Mg Tabs (Glyburide) .... 2 by mouth two times a day 13)  Vicodin Es 7.5-750 Mg Tabs (Hydrocodone-acetaminophen) .Marland KitchenMarland KitchenMarland Kitchen  1 by mouth every 6 hours as needed   Patient Instructions: 1)  See Admission Orders   ]

## 2010-08-16 NOTE — Progress Notes (Signed)
Summary: Med Question  Phone Note Call from Patient Call back at Home Phone [phone]   Summary of Call: Patient left message on triage and  is calling in regards to her meds. Patient is unclear of which meds she needs to take. Patient is taking 4 Glyburide tabs daily and wants to know if she should continue that regimen or go back 2 Glyburide and 2 Metformin per day. Please advise. Initial call taken by: Lucious Groves,  July 20, 2009 4:40 PM  Follow-up for Phone Call        please see note of 07/05/09 "pt instructions" Follow-up by: Minus Breeding MD,  July 21, 2009 7:20 AM  Additional Follow-up for Phone Call Additional follow up Details #1::        pt informed. Pt also states thst she is still experiencing abdominal pain and would like to have HIDA scan done. pt told to expect call from [facility] once order has been done Additional Follow-up by: Margaret Pyle, CMA,  July 21, 2009 8:26 AM    Additional Follow-up for Phone Call Additional follow up Details #2::    i ordered Follow-up by: Minus Breeding MD,  July 21, 2009 9:04 AM  Prescriptions: METFORMIN HCL 500 MG XR24H-TAB (METFORMIN HCL) 1 qam  #30 x 11   Entered by:   Margaret Pyle, CMA   Authorized by:   Minus Breeding MD   Signed by:   Margaret Pyle, CMA on 07/21/2009   Method used:   Electronically to        [facility] [address] [id number] (retail)       [address] [place], [place]  [address]       Ph: [phone]       Fax: [phone]   RxID:   [id number]

## 2010-08-16 NOTE — Progress Notes (Signed)
Summary: Rosita Fire Fyi  Phone Note Outgoing Call   Summary of Call: pt states that she really does not want to take insulin and will discuss with you in upcoming appt about this matter.   pt aware, lab mailed, rx sent  Initial call taken by: Annie Jeffrey [facility] CMA,  July 09, 2008 12:35 PM    New/Updated Medications: VITAMIN D 45409 UNIT CAPS (ERGOCALCIFEROL) take 1 tab weekly   Prescriptions: VITAMIN D 81191 UNIT CAPS (ERGOCALCIFEROL) take 1 tab weekly  #4 x 1   Entered by:   Jeremy Johann CMA   Authorized by:   Loreen Freud DO   Signed by:   Jeremy Johann CMA on 07/09/2008   Method used:   Electronically to        [facility] [address] [id number] (retail)       [address] [place], [place]  [address]       Ph: [phone]       Fax: [phone]   RxID:   [id number]

## 2010-08-16 NOTE — Miscellaneous (Signed)
Summary: Orders Update   Clinical Lists Changes  Observations: Added new observation of INSTRUCTIONS: fasting labs  272.4,  250.00,  401.9   hep, lipid, bmp, hgba1c, microalbumin (04/21/2008 15:55)      Patient Instructions: 1)  fasting labs  272.4,  250.00,  401.9   hep, lipid, bmp, hgba1c, microalbumin

## 2010-08-16 NOTE — Progress Notes (Signed)
Summary: pt gave new #[id number]  Phone Note Outgoing Call Call back at [facility] Phone [phone]   Call placed by: [name],  February 04, 2009 11:28 AM Call placed to: Patient Summary of Call: normal xray  tried to call patient and line busy x 3  Follow-up for Phone Call        patient husband gave new temporary # [id number] Follow-up by: [name] [place],  February 04, 2009 1:18 PM  Additional Follow-up for Phone Call Additional follow up Details #1::        Patient spouse aware [name]  February 04, 2009 1:35 PM  Additional Follow-up by: [name],  February 04, 2009 1:35 PM

## 2010-08-16 NOTE — Letter (Signed)
Summary: Results Follow-up Letter  [place] [facility]  [address] [place], [place] [address]   Phone: [phone]  Fax: [phone]    10/26/2008        Donna Pham [address] [place], [place]  [address]  Dear Ms. Konigsberg,   The following are the results of your recent test(s):  Test     Result     Pap Smear    Normal_______  Not Normal_____       Comments: _________________________________________________________ Cholesterol LDL(Bad cholesterol):          Your goal is less than:         HDL (Good cholesterol):        Your goal is more than: _________________________________________________________ Other Tests:   _________________________________________________________  Please call for an appointment Or _Please see attached labwork.________________________________________________________ _________________________________________________________ _________________________________________________________  Sincerely,  [name] [place] at [facility]

## 2010-08-16 NOTE — Letter (Signed)
Summary: Unable To Reach-Consult Scheduled  [place] [facility] [place]  [address] [place], [place] [address]   Phone: [phone]  Fax: [phone]    03/15/2007 MRN: [id number]    Dear Ms. Salek,   We have been unable to reach you by phone.  Please contact our office with an updated phone number.  At the recommendation of Dr.Lowne, we have been asked to schedule you a consult with Dr. Mina Marble @ [facility] [address] on 9.09.08 @ 2:15 pm.  Their phone number is [phone]. If you wish to decline this appointment, call the number of the office you are being referred to listed above and let them know.  If you have any question please call us.     Thank you,  Ebony Cargo at [facility]

## 2010-08-16 NOTE — Medication Information (Signed)
Summary: [facility]  [facility]   Imported By: Lanelle Bal 11/01/2009 09:41:47  _____________________________________________________________________  External Attachment:    Type:   Image     Comment:   External Document

## 2010-08-16 NOTE — Progress Notes (Signed)
  Phone Note Call from Patient Call back at Home Phone [phone]   Caller: Patient Summary of Call: pt called needs a root canal and dentist is asking for $600 to do procedure, she wants to know if we knew of a dentist that was less expensive, Pt given [facility] # informed pt she can try there, .Kandice Hams  October 22, 2009 10:50 AM  Initial call taken by: Kandice Hams,  October 22, 2009 10:50 AM

## 2010-08-16 NOTE — Letter (Signed)
Summary: Results Follow-up Letter  [place] [facility]  [address] [place], [place] [address]   Phone: [phone]  Fax: [phone]    07/20/2008        [facility] [place] [address] [place], [place]  [address]  Dear Donna Pham,   The following are the results of your recent test(s):  Test     Result     Pap Smear    Normal_______  Not Normal_____       Comments: _________________________________________________________ Cholesterol LDL(Bad cholesterol):          Your goal is less than:         HDL (Good cholesterol):        Your goal is more than: _________________________________________________________ Other Tests:   _________________________________________________________  Please call for an appointment Or PLEASE SEE ATTACHED LABS._________________________________________________________ _________________________________________________________ _________________________________________________________  Sincerely,  Felecia Deloach CMA  at [facility]

## 2010-08-16 NOTE — Letter (Signed)
Summary: Results Follow-up Letter  [name] at [facility]  [address] [place], [place] [address]   Phone: [phone]  Fax: [phone]    07/09/2008        Donna Pham [address] [place], [place]  [address]  Dear Ms. Braziel,   The following are the results of your recent test(s):  Test     Result     Pap Smear    Normal_______  Not Normal_____       Comments: _________________________________________________________ Cholesterol LDL(Bad cholesterol):          Your goal is less than:         HDL (Good cholesterol):        Your goal is more than: _________________________________________________________ Other Tests:   _________________________________________________________  Please call for an appointment Or Please see attached labs._________________________________________________________ _________________________________________________________ _________________________________________________________  Sincerely,  Felecia Deloach CMA [place] at [facility]

## 2010-08-16 NOTE — Progress Notes (Signed)
Summary: lab results   Phone Note Outgoing Call   Call placed by: Army Fossa CMA,  December 29, 2009 3:17 PM Reason for Call: Discuss lab or test results Summary of Call: Regarding lab results, LMTCB:  TG are high. Ideally your LDL (bad cholesterol) should be <_70___, your HDL (good cholesterol) should be >_40__ and your triglycerides should be< 150.  Diet and exercise will increase HDL and decrease the LDL and triglycerides. Read Dr. Danice Goltz book--Eat Drink and Be Healthy.  We will recheck labs in __3_ months. ADD tricor 145mg  #30  1 by mouth  once daily , 2 refills-----272.4 lipid, hep   Signed by Loreen Freud DO on 12/28/2009 at 5:43 PM  ________________________________________________________________________ also add hgba1c, bmp, microalbumin 250.00  Follow-up for Phone Call        d/w pt. Army Fossa CMA  December 29, 2009 4:05 PM     New/Updated Medications: TRICOR 145 MG TABS (FENOFIBRATE) 1 by mouth daily Prescriptions: TRICOR 145 MG TABS (FENOFIBRATE) 1 by mouth daily  #30 x 2   Entered by:   Army Fossa CMA   Authorized by:   Loreen Freud DO   Signed by:   Army Fossa CMA on 12/29/2009   Method used:   Electronically to        [id number] (retail)       [address] [place], [place]  [address]       Ph: [phone]       Fax: [phone]   RxID:   [id number]

## 2010-08-16 NOTE — Progress Notes (Signed)
  Phone Note Outgoing Call Call back at [facility] Phone [phone]   Call placed by: [name],  May 04, 2008 11:12 AM Call placed to: Patient Summary of Call: Onglyza placed up front and patient aware of labs. [name]  May 04, 2008 11:12 AM

## 2010-08-16 NOTE — Assessment & Plan Note (Signed)
Summary: post hospital for cold chill--ph   Vital Signs:  Patient Profile:   55 Years Old Female Weight:      224.4 pounds Temp:     98.0 degrees F oral Pulse rate:   80 / minute Resp:     16 per minute BP sitting:   110 / 76  (left arm) Cuff size:   large                 PCP:  Lowne  Chief Complaint:  hospital follow-up.  History of Present Illness: Dry cough since D/C ; no better with cough syrup. D/C Summary reviewed. FBS 82 (checked only once); 2 hr pc not checked.  No diet; no CVE even PTA pre illness.    Current Allergies (reviewed today): No known allergies   Past Medical History:    Depression    Diabetes mellitus, type II    GERD    Hypertension    Hyperlipidemia    CTS ; 6/15-19/09 RLL  PNA with sepsis,high LFTs(ALT 61)     Review of Systems  General      Denies chills, fever, and sweats.  Resp      Complains of cough.      Denies shortness of breath, sputum productive, and wheezing.  Derm      Complains of rash.      Rash under panniculus since 01/05/08   Physical Exam  General:     Well-developed,well-nourished,in no acute distress; alert,appropriate and cooperative throughout examination;overweight-appearing.   Lungs:     Normal respiratory effort, chest expands symmetrically. Lungs are clear to auscultation, no crackles or wheezes. Heart:     Normal rate and regular rhythm. S1 and S2 normal without gallop, murmur, click, rub. S4 with slurring Pulses:     R and L carotid,radial,dorsalis pedis and posterior tibial pulses are full and equal bilaterally Skin:     Classic rash under panniculus Cervical Nodes:     No lymphadenopathy noted Axillary Nodes:     No palpable lymphadenopathy    Impression & Recommendations:  Problem # 1:  PNEUMONIA, RIGHT LOWER LOBE (ICD-486) Resolved  Problem # 2:  UNS ADVRS EFF UNS RX MEDICINAL&BIOLOGICAL SBSTNC (ICD-995.20) Cough due to ACE-I  Problem # 3:  CANDIDIASIS OF UNSPECIFIED SITE  (ICD-112.9) under panniculus  Complete Medication List: 1)  Robinul-forte 2 Mg Tabs (Glycopyrrolate) .Marland Kitchen.. 1 tablet by mouth twice a day 2)  Ultram 50 Mg Tabs (Tramadol hcl) .... Take 1 tablet by mouth as directed 3)  Prevacid 15 Mg Cpdr (Lansoprazole) .... Take one tablet daily 4)  Lyrica 50 Mg Caps (Pregabalin) .Marland Kitchen.. 1 once daily 5)  Glyburide 5 Mg Tabs (Glyburide) .... Take one tablet twice daily 6)  Cymbalta 60 Mg Cpep (Duloxetine hcl) .Marland Kitchen.. 1 once daily 7)  Actos 45 Mg Tabs (Pioglitazone hcl) .Marland Kitchen.. 1 once daily 8)  Janumet 50-1000 Mg Tabs (Sitagliptin-metformin hcl) .... Take one tablet twice daily 9)  Lipitor 80 Mg Tabs (Atorvastatin calcium) .Marland Kitchen.. 1 at bedtime 10)  Gemfibrozil 600 Mg Tabs (Gemfibrozil) .Marland Kitchen.. 1 by mouth two times a day 11)  Glyburide 5 Mg Tabs (Glyburide) .... 2 by mouth two times a day 12)  Vicodin Es 7.5-750 Mg Tabs (Hydrocodone-acetaminophen) .Marland Kitchen.. 1 by mouth every 6 hours as needed 13)  Diovan 160 Mg Tabs (Valsartan) .Marland Kitchen.. 1 once daily   Patient Instructions: 1)  Follow [employer]. In am fasting sugar goal = 70-130; 2 hrs after any meal goal = <  160. Use Nizoral once daily to rash & blow dry. 2)  Please schedule a follow-up appointment in 1 month with Dr Laury Axon.   Prescriptions: DIOVAN 160 MG  TABS (VALSARTAN) 1 once daily  #35( samples) x 0   Entered and Authorized by:   Marga Melnick MD   Signed by:   Marga Melnick MD on 01/10/2008   Method used:   Print then Give to Patient   RxID:   [id number]  ]

## 2010-08-16 NOTE — Assessment & Plan Note (Signed)
Summary: HAND PAIN/ALR   Vital Signs:  Patient Profile:   55 Years Old Female Weight:      221 pounds Temp:     98.1 degrees F oral Pulse rate:   72 / minute BP sitting:   118 / 82  (left arm) Cuff size:   large  Vitals Entered By: [name] (March 12, 2007 1:22 PM)               PCP:  Laury Axon  Chief Complaint:  ONGOING PAIN IN HANDS and .  History of Present Illness: Pt here c/o pain in hands con't--  Hand surgeon wanted to do surgery but pt put it off.  Now she is ready and needs pain meds to last until appointment.        Review of Systems      See HPI   Physical Exam  General:     Well-developed,well-nourished,in no acute distress; alert,appropriate and cooperative throughout examination Msk:     normal ROM.   Extremities:     + numbness in fingers b/l--  pain now in shoulders too    Impression & Recommendations:  Problem # 1:  CARPAL TUNNEL SYNDROME, BILATERAL, HX OF (ICD-V12.49) Pt now ready for sugery if still indicated  Orders: Orthopedic Surgeon Referral (Ortho Surgeon) refill ultram  Problem # 2:  SHOULDER STRAIN (ICD-840.9) no better -- may be related to #1 Pt had NCS of upper ext done by hand center--- results not available at this moment? pinched nerve in neck Pt will f/u with them  Complete Medication List: 1)  Robinul-forte 2 Mg Tabs (Glycopyrrolate) .Marland Kitchen.. 1 tablet by mouth twice a day 2)  Ultram 50 Mg Tabs (Tramadol hcl) .... Take 1 tablet by mouth as directed 3)  Prevacid 15 Mg Cpdr (Lansoprazole) .Marland Kitchen.. 1 once daily 4)  Lyrica 50 Mg Caps (Pregabalin) .Marland Kitchen.. 1 once daily 5)  Glyburide Tabs (Glyburide tabs) .... 5 mg two times a day 6)  Cymbalta 60 Mg Cpep (Duloxetine hcl) .Marland Kitchen.. 1 once daily 7)  Actos 45 Mg Tabs (Pioglitazone hcl) .Marland Kitchen.. 1 once daily 8)  Glucophage 500 Mg Tabs (Metformin hcl) .Marland Kitchen.. 1 by mouth two times a day 9)  Lisinopril 10 Mg Tabs (Lisinopril) .Marland Kitchen.. 1 once daily 10)  Lipitor 80 Mg Tabs (Atorvastatin calcium) .Marland Kitchen.. 1 at  bedtime 11)  Gemfibrozil 600 Mg Tabs (Gemfibrozil) .Marland Kitchen.. 1 by mouth two times a day 12)  Glyburide 5 Mg Tabs (Glyburide) .... 2 by mouth two times a day     Prescriptions: ULTRAM 50 MG TABS (TRAMADOL HCL) Take 1 tablet by mouth as directed  #60 x 1   Entered and Authorized by:   Loreen Freud DO   Signed by:   Loreen Freud DO on 03/12/2007   Method used:   Print then Give to Patient   RxID:   [id number]

## 2010-08-16 NOTE — Letter (Signed)
Summary: Results Follow up Letter  [place] [facility] [place]  [address] [place], [place] [address]   Phone: [phone]  Fax: [phone]    04/26/2009 MRN: [id number]  [facility] [place] [address] [place], [place]  [address]  Dear Donna Pham,  The following are the results of your recent test(s):  Test         Result    Pap Smear:        Normal _____  Not Normal _____ Comments: ______________________________________________________ Cholesterol: LDL(Bad cholesterol):         Your goal is less than:         HDL (Good cholesterol):       Your goal is more than: Comments:  ______________________________________________________ Mammogram:        Normal _____  Not Normal _____ Comments:  ___________________________________________________________________ Hemoccult:        Normal _____  Not normal _______ Comments:    _____________________________________________________________________ Other Tests:  See attachment for results on your urine culture.   We routinely do not discuss normal results over the telephone.  If you desire a copy of the results, or you have any questions about this information we can discuss them at your next office visit.   Sincerely,   Army Fossa CMA  April 26, 2009 8:25 AM

## 2010-08-16 NOTE — Miscellaneous (Signed)
  Clinical Lists Changes 

## 2010-08-16 NOTE — Progress Notes (Signed)
Summary: [facility]  Phone Note Other Incoming   Summary of Call: Received paperwork from [facility]/ put on MD's desk. Initial call taken by: Josph Macho CMA,  August 12, 2009 4:14 PM

## 2010-08-16 NOTE — Progress Notes (Signed)
Summary: med concerns   Phone Note Call from Patient Call back at [facility] Phone [phone]   Summary of Call: Pt called and said that Dr.Ellison told her to change from the Glyburide to Metformin, she said he was going to ask you.Marland Kitchen She would like to know what to do?  Initial call taken by: Army Fossa CMA,  July 20, 2009 11:29 AM  Follow-up for Phone Call         thought the metformin caused too much diarrhea---she needs to discuss it with Dr Everardo All --he is the DM specialist. Follow-up by: Loreen Freud DO,  July 20, 2009 12:08 PM  Additional Follow-up for Phone Call Additional follow up Details #1::        spoke with pt and informed her of this, she understood and said she would call him.  Additional Follow-up by: Army Fossa CMA,  July 20, 2009 12:50 PM

## 2010-08-16 NOTE — Progress Notes (Signed)
Summary: PT WANTS TO BE SEEN TOMORROW WITH DR Laury Axon  Phone Note Call from Patient Call back at [facility] Phone [phone] Call back at [phone]   Caller: Patient Reason for Call: Acute Illness, Talk to Nurse Summary of Call: DR LOWNE//PT HAS PAIN IN HER BOTH HANDS.PT WANTS TO SEE DR LOWNE TOMORROW WITH HER HUSBAND. Initial call taken by: Job Founds,  March 11, 2007 1:37 PM  Follow-up for Phone Call        SPOKE WITHPT INFORMED DR Nida Boatman SEE BOTH BUT CAN SEE ONE OV SCHED FOR PT TOMORROW  DR LOWNE Follow-up by: Kandice Hams,  March 11, 2007 4:33 PM

## 2010-08-16 NOTE — Letter (Signed)
Summary: [facility]   Imported By: Lanelle Bal 10/22/2008 10:44:07  _____________________________________________________________________  External Attachment:    Type:   Image     Comment:   External Document

## 2010-08-16 NOTE — Medication Information (Signed)
Summary: Approval for [employer]  Approval for [employer]   Imported By: Lanelle Bal 12/03/2008 11:59:47  _____________________________________________________________________  External Attachment:    Type:   Image     Comment:   External Document

## 2010-08-16 NOTE — Progress Notes (Signed)
Summary: refill med  Phone Note Call from Patient   Caller: Patient Reason for Call: Refill Medication Summary of Call: dr. Laury Axon [phone] [facility] would [facility] metformin 500mg   Initial call taken by: [name],  February 11, 2007 9:16 AM  Follow-up for Phone Call        CALLED IN RX TO [facility] ON [address]. PATIENTS HUSBAND AWARE #30/3 REFILLS Follow-up by: [name],  February 12, 2007 12:47 PM

## 2010-08-16 NOTE — Letter (Signed)
Summary: Primary Care Consult Scheduled Letter  [place] [facility] [place]  [address] [place], [place] [address]   Phone: [phone]  Fax: [phone]      06/15/2009 MRN: [id number]  [facility] [place] [address] [place], [place]  [address]    Dear [name],    We have scheduled an appointment for you.  At the recommendation of Dr. Loreen Freud, we have scheduled you a consult with Dr. Romero Belling with [facility] on 07-05-09 at 3:45pm.  Their address is [address] floor, [place] [address]. The office phone number is [phone].  If this appointment day and time is not convenient for you, please feel free to call the office of the doctor you are being referred to at the number listed above and reschedule the appointment.    It is important for you to keep your scheduled appointments. We are here to make sure you are given good patient care.   Thank you,    Renee, Patient Care Coordinator [place] [facility] [place]    **IF YOU ARE UNABLE TO KEEP THIS APPOINTMENT OR NEED TO RESCHEDULE, PLEASE GIVE DR. ELLISON'S OFFICE 24 HOUR NOTICE TO AVOID A $50 FEE**

## 2010-08-16 NOTE — Assessment & Plan Note (Signed)
Summary: pain rt leg below knee.cbs   Vital Signs:  Patient profile:   55 year old female Height:      64 inches Weight:      230.25 pounds BMI:     39.67 BSA:     2.08 Temp:     98.2 degrees F oral Pulse rate:   78 / minute Resp:     18 per minute BP sitting:   120 / 74  (right arm)  Vitals Entered By: [name] (October 05, 2008 11:12 AM) Is Patient Diabetic? Yes  Pain Assessment Patient in pain? no       Have you ever been in a relationship where you felt threatened, hurt or afraid?No   Does patient need assistance? Functional Status Self care   History of Present Illness: Pt here with wife c/o L foot and ankle pain that is better after podiatry injected it.  Pt never had CTS surgery because hand surgeon did not acccept insurance.  Pt needs new referral and needs labs.   + increased anxiety.   Pt has not been taking meds regularly and was on cymbalta and zoloft.  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 Take 1 Tablet By Mouth As Directed 2)  Lyrica 50 Mg  Caps (Pregabalin) .Marland Kitchen.. 1 Once Daily 3)  Glyburide 5 Mg  Tabs (Glyburide) .... 2 Tabs By Mouth Two Times A Day 4)  Cymbalta 60 Mg  Cpep (Duloxetine Hcl) .Marland Kitchen.. 1 Once Daily 5)  Actos 45 Mg  Tabs (Pioglitazone Hcl) .Marland Kitchen.. 1 Once Daily 6)  Lipitor 80  Mg  Tabs (Atorvastatin Calcium) .Marland Kitchen.. 1 At Bedtime 7)  Gemfibrozil 600 Mg  Tabs (Gemfibrozil) .Marland Kitchen.. 1 By Mouth Two Times A Day 8)  Glyburide 5 Mg  Tabs (Glyburide) .... 2 By Mouth Two Times A Day 9)  Vicodin Es 7.5-750 Mg  Tabs (Hydrocodone-Acetaminophen) .Marland Kitchen.. 1 By Mouth Every 6 Hours As Needed 10)  Diovan 160 Mg  Tabs (Valsartan) .Marland Kitchen.. 1 Once Daily 11)  Onglyza 5 Mg Tabs (Saxagliptin Hcl) .... Take 1 Tabs 12)  Augmentin 875-125 Mg Tabs (Amoxicillin-Pot Clavulanate) .Marland Kitchen.. 1 By Mouth Two Times A Day 13)  Nexium 40 Mg Cpdr (Esomeprazole Magnesium) .Marland Kitchen.. 1 By Mouth Qd 14)  Vitamin D 14782 Unit Caps (Ergocalciferol) .... Take 1 Tab Weekly 15)  Diflucan 150 Mg Tabs (Fluconazole) .... Take 1 By Mouth Once Daily For 1 Day May Repeat in 1 Week As Needed  Current Medications (verified): 1)  Ultram 50 Mg Tabs (Tramadol Hcl) .... Take 1 Tablet By Mouth As Directed 2)  Lyrica 50 Mg  Caps (Pregabalin) .Marland Kitchen.. 1 Once Daily 3)  Glyburide 5 Mg  Tabs (Glyburide) .... 2 Tabs By Mouth Two Times A Day 4)  Actos 45 Mg  Tabs (Pioglitazone Hcl) .Marland Kitchen.. 1 Once Daily  5)  Lipitor 80 Mg  Tabs (Atorvastatin Calcium) .Marland Kitchen.. 1 At Bedtime 6)  Gemfibrozil 600 Mg  Tabs (Gemfibrozil) .Marland Kitchen.. 1 By Mouth Two Times A Day 7)  Glyburide 5 Mg  Tabs (Glyburide) .... 2 By Mouth Two Times A Day 8)  Vicodin Es 7.5-750 Mg  Tabs (Hydrocodone-Acetaminophen) .Marland Kitchen.. 1 By Mouth Every 6 Hours As Needed 9)  Diovan 160 Mg  Tabs (Valsartan) .Marland Kitchen.. 1 Once Daily 10)  Onglyza 5 Mg Tabs (Saxagliptin Hcl) .... Take 1 Tabs 11)  Augmentin 875-125 Mg Tabs (Amoxicillin-Pot Clavulanate) .Marland Kitchen.. 1 By Mouth Two Times A Day 12)  Nexium 40 Mg Cpdr (Esomeprazole Magnesium) .Marland Kitchen.. 1 By Mouth Qd 13)  Vitamin D 16109 Unit Caps (Ergocalciferol) .... Take 1 Tab Weekly 14)  Diflucan 150 Mg Tabs (Fluconazole) .... Take 1 By Mouth Once Daily For 1 Day May Repeat in 1 Week As Needed 15)  Zoloft 100 Mg Tabs (Sertraline Hcl) .Marland Kitchen.. 1 By Mouth Once Daily  Allergies (verified): No Known Drug  Allergies  Past History:  Family History:    Father:arthritis, ? [place]     Mother: DM    Siblings:none      (12/30/2007)  Social History:    no diet    Retired    Married    Drug use-no    Regular exercise-no    Never Smoked    Alcohol use-no     (07/13/2008)  Risk Factors:    Alcohol Use: N/A    >5 drinks/d w/in last 3 months: N/A    Caffeine Use: 1 (07/13/2008)    Diet: N/A    Exercise: no (07/13/2008)  Risk Factors:    Smoking Status: never (07/13/2008)    Packs/Day: N/A    Cigars/wk: N/A    Pipe Use/wk: N/A    Cans of tobacco/wk: N/A    Passive Smoke Exposure: N/A  Past medical, surgical, family and social histories (including risk factors) reviewed, and no changes noted (except as noted below).  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 [place]        Mother: DM       Siblings:none   Social History:    Reviewed history from 07/13/2008 and no changes required:       no diet       Retired       Married       Drug use-no       Regular exercise-no       Never Smoked       Alcohol use-no  Review of Systems      See HPI  Physical Exam  General:  Well-developed,well-nourished,in no acute distress; alert,appropriate and cooperative throughout examination Neck:  No deformities, masses, or tenderness noted.no carotid bruits.   Lungs:  Normal respiratory effort, chest expands symmetrically. Lungs are clear to auscultation, no crackles or wheezes. Heart:  normal rate and regular rhythm.   Extremities:  No clubbing, cyanosis, edema, or deformity noted with normal full range of  motion of all joints.    Diabetes Management Exam:    Foot Exam (with socks and/or shoes not present):       Sensory-Pinprick/Light touch:          Left medial foot (L-4): normal          Left dorsal foot (L-5): normal          Left lateral foot (S-1): normal          Right medial foot (L-4): normal          Right dorsal foot (L-5): normal          Right lateral foot (S-1): normal       Sensory-Monofilament:          Left foot: normal          Right foot: normal       Inspection:          Left foot: normal          Right foot: normal       Nails:          Left foot: normal          Right foot: normal    Foot Exam by Podiatrist:       Results: no diabetic findings       Done by: Clemencia Course Exam:       Eye Exam done elsewhere   Impression & Recommendations:  Problem # 1:  HYPERLIPIDEMIA (ICD-272.4)  Her updated medication list for this problem includes:    Lipitor 80 Mg Tabs (Atorvastatin calcium) .Marland Kitchen... 1 at bedtime    Gemfibrozil 600 Mg Tabs (Gemfibrozil) .Marland Kitchen... 1 by mouth two times a day  Orders: Venipuncture (84696) TLB-Lipid Panel (80061-LIPID) TLB-BMP (Basic Metabolic Panel-BMET) (80048-METABOL) TLB-CBC Platelet - w/Differential (85025-CBCD) TLB-Hepatic/Liver Function Pnl (80076-HEPATIC) TLB-A1C / Hgb A1C (Glycohemoglobin) (83036-A1C) TLB-Microalbumin/Creat Ratio, Urine (82043-MALB)  Labs Reviewed: Chol: 173 (07/02/2008)   HDL: 34.4 (07/02/2008)   LDL: 108 (07/02/2008)   TG: 153 (07/02/2008) SGOT: 19 (07/02/2008)   SGPT: 25 (07/02/2008)  Problem # 2:  CARPAL TUNNEL SYNDROME, BILATERAL, HX OF (ICD-V12.49)  Orders: Orthopedic Surgeon Referral (Ortho Surgeon)  Problem # 3:  HYPERTENSION (ICD-401.9)  Her updated medication list for this problem includes:    Diovan 160 Mg Tabs (Valsartan) .Marland Kitchen... 1 once daily  Orders: Venipuncture (29528) TLB-Lipid Panel (80061-LIPID) TLB-BMP (Basic Metabolic Panel-BMET) (80048-METABOL) TLB-CBC Platelet - w/Differential  (85025-CBCD) TLB-Hepatic/Liver Function Pnl (80076-HEPATIC) TLB-A1C / Hgb A1C (Glycohemoglobin) (83036-A1C) TLB-Microalbumin/Creat Ratio, Urine (82043-MALB)  BP today: 120/74 Prior BP: 122/82 (07/13/2008)  Labs Reviewed: Creat: 0.6 (07/02/2008) Chol: 173 (07/02/2008)   HDL: 34.4 (07/02/2008)   LDL: 108 (07/02/2008)   TG: 153 (07/02/2008)  Problem # 4:  DIABETES MELLITUS, TYPE II (ICD-250.00)  Her updated medication list for this problem includes:    Glyburide 5 Mg Tabs (Glyburide) .Marland Kitchen... 2 tabs by mouth two times a day    Actos 45 Mg Tabs (Pioglitazone hcl) .Marland Kitchen... 1 once daily    Glyburide 5 Mg Tabs (Glyburide) .Marland Kitchen... 2 by mouth two times a day    Diovan 160 Mg Tabs (Valsartan) .Marland Kitchen... 1 once daily    Onglyza 5 Mg Tabs (Saxagliptin hcl) .Marland Kitchen... Take 1 tabs  Orders: Venipuncture (41324) TLB-Lipid Panel (80061-LIPID) TLB-BMP (Basic Metabolic Panel-BMET) (80048-METABOL) TLB-CBC Platelet - w/Differential (85025-CBCD) TLB-Hepatic/Liver Function Pnl (80076-HEPATIC) TLB-A1C / Hgb A1C (Glycohemoglobin) (83036-A1C) TLB-Microalbumin/Creat Ratio, Urine (82043-MALB)  Labs Reviewed: Creat: 0.6 (07/02/2008)    HgBA1c: 7.2 (  07/02/2008)  7.7 (04/27/2008)  Complete Medication List: 1)  Ultram 50 Mg Tabs (Tramadol hcl) .... Take 1 tablet by mouth as directed 2)  Lyrica 50 Mg Caps (Pregabalin) .Marland Kitchen.. 1 once daily 3)  Glyburide 5 Mg Tabs (Glyburide) .... 2 tabs by mouth two times a day 4)  Actos 45 Mg Tabs (Pioglitazone hcl) .Marland Kitchen.. 1 once daily 5)  Lipitor 80 Mg Tabs (Atorvastatin calcium) .Marland Kitchen.. 1 at bedtime 6)  Gemfibrozil 600 Mg Tabs (Gemfibrozil) .Marland Kitchen.. 1 by mouth two times a day 7)  Glyburide 5 Mg Tabs (Glyburide) .... 2 by mouth two times a day 8)  Vicodin Es 7.5-750 Mg Tabs (Hydrocodone-acetaminophen) .Marland Kitchen.. 1 by mouth every 6 hours as needed 9)  Diovan 160 Mg Tabs (Valsartan) .Marland Kitchen.. 1 once daily 10)  Onglyza 5 Mg Tabs (Saxagliptin hcl) .... Take 1 tabs 11)  Augmentin 875-125 Mg Tabs (Amoxicillin-pot  clavulanate) .Marland Kitchen.. 1 by mouth two times a day 12)  Nexium 40 Mg Cpdr (Esomeprazole magnesium) .Marland Kitchen.. 1 by mouth qd 13)  Vitamin D 16109 Unit Caps (Ergocalciferol) .... Take 1 tab weekly 14)  Diflucan 150 Mg Tabs (Fluconazole) .... Take 1 by mouth once daily for 1 day may repeat in 1 week as needed 15)  Zoloft 100 Mg Tabs (Sertraline hcl) .Marland Kitchen.. 1 by mouth once daily  Other Orders: TLB-B12 + Folate Pnl (82746_82607-B12/FOL)  Patient Instructions: 1)  rto 3-4 weeks 2)  The medication list was reviewed and reconciled.  All changed / newly prescribed medications were explained.  A complete medication list was provided to the patient / caregiver. Prescriptions: ZOLOFT 100 MG TABS (SERTRALINE HCL) 1 by mouth once daily  #30 x 2   Entered and Authorized by:   Loreen Freud DO   Signed by:   Loreen Freud DO on 10/05/2008   Method used:   Electronically to        [facility] (retail)       [address]       [place], [place]  [address]       Ph: [phone]       Fax: [phone]   RxID:   [id number]   Appended Document: pain rt leg below knee.cbs  Laboratory Results   Urine Tests   Date/Time Reported: October 05, 2008 1:12 PM   Routine Urinalysis   Color: yellow Appearance: Clear Glucose: negative   (Normal Range: Negative) Bilirubin: negative   (Normal Range: Negative) Ketone: negative   (Normal Range: Negative) Spec. Gravity: 1.010   (Normal Range: 1.003-1.035) Blood: negative   (Normal Range: Negative) pH: 6.5   (Normal Range: 5.0-8.0) Protein: negative   (Normal Range: Negative) Urobilinogen: negative   (Normal Range: 0-1) Nitrite: negative   (Normal Range: Negative) Leukocyte Esterace: negative   (Normal Range: Negative)    Comments: Floydene Flock CMA  October 05, 2008 1:12 PM

## 2010-08-16 NOTE — Assessment & Plan Note (Signed)
Summary: ACUTE/COUGH/ALR   Vital Signs:  Patient profile:   55 year old female Height:      64 inches Weight:      232.13 pounds O2 Sat:      98 % Temp:     98.4 degrees F oral Pulse rate:   98 / minute Resp:     18 per minute BP sitting:   120 / 84  (right arm)  Vitals Entered By: [name] (February 03, 2009 10:40 AM) CC: coughing for 4 weeks, mainly in the morning, Cough Is Patient Diabetic? Yes  Pain Assessment Patient in pain? no       Have you ever been in a relationship where you felt threatened, hurt or afraid?No   Does patient need assistance? Functional Status Self care Ambulation Normal   History of Present Illness:  Cough      This is a 55 year old woman who presents with Cough.  The symptoms began 4 weeks ago.  The patient reports productive cough and wheezing, but denies non-productive cough, pleuritic chest pain, shortness of breath, exertional dyspnea, fever, hemoptysis, and malaise.  The patient denies the following symptoms: cold/URI symptoms, sore throat, nasal congestion, chronic rhinitis, weight loss, acid reflux symptoms, and peripheral edema.  The cough is worse with activity and lying down.    Current Medications (verified): 1)  Ultram 50 Mg Tabs (Tramadol Hcl) .... Take 1 Tablet By Mouth As Directed 2)  Lyrica 50 Mg  Caps (Pregabalin) .Marland Kitchen.. 1 Once Daily 3)  Glyburide 5 Mg  Tabs (Glyburide) .... 2 Tabs By Mouth Two Times A Day 4)  Actos 45 Mg  Tabs (Pioglitazone Hcl) .Marland Kitchen.. 1 Once Daily 5)  Lipitor 80 Mg  Tabs (Atorvastatin Calcium) .Marland Kitchen.. 1 At Bedtime 6)  Gemfibrozil 600 Mg  Tabs (Gemfibrozil) .Marland Kitchen.. 1 By Mouth Two Times A Day 7)  Glyburide 5 Mg  Tabs (Glyburide) .... 2 By Mouth Two Times A Day 8)  Vicodin Es 7.5-750 Mg  Tabs (Hydrocodone-Acetaminophen) .Marland Kitchen.. 1 By Mouth Every 6 Hours As Needed 9)  Diovan 160 Mg  Tabs (Valsartan) .Marland Kitchen.. 1 Once Daily 10)  Onglyza 5 Mg Tabs (Saxagliptin Hcl) .... Take 1 Tabs 11)  Nexium 40 Mg Cpdr (Esomeprazole Magnesium)  .Marland Kitchen.. 1 By Mouth Qd 12)  Vitamin D 04540 Unit Caps (Ergocalciferol) .... Take 1 Tab Weekly 13)  Zoloft 100 Mg Tabs (Sertraline Hcl) .Marland Kitchen.. 1 By Mouth Once Daily 14)  Zithromax Z-Pak 250 Mg Tabs (Azithromycin) .... 2 By Mouth Once Daily X1  Then 1 By Mouth Once Daily  Allergies (verified): No Known Drug Allergies  Past History:  Past medical, surgical, family and social histories (including risk factors) reviewed, and no changes noted (except as noted below).  Past Medical History: Reviewed history from 01/10/2008 and no changes required. 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 G6 P 2  Family History: Reviewed history from 12/30/2007 and no changes required. Father:arthritis, ? [place]  Mother: DM Siblings:none   Social History: Reviewed history from 07/13/2008 and no changes required. no diet Retired Married Drug use-no Regular exercise-no Never Smoked Alcohol use-no  Review of Systems      See HPI  Physical Exam  General:  Well-developed,well-nourished,in no acute distress; alert,appropriate and cooperative throughout examination Ears:  External ear exam shows no significant lesions or deformities.  Otoscopic examination reveals clear canals,  tympanic membranes are intact bilaterally without bulging, retraction, inflammation or discharge. Hearing is grossly normal bilaterally. Nose:  External nasal examination shows no deformity or inflammation. Nasal mucosa are pink and moist without lesions or exudates. Neck:  No deformities, masses, or tenderness noted. Lungs:  R decreased breath sounds and L decreased breath sounds.   Heart:  Normal rate and regular rhythm. S1 and S2 normal without gallop, murmur, click, rub or other extra sounds. Extremities:  No clubbing, cyanosis, edema, or deformity noted with  normal full range of motion of all joints.     Impression & Recommendations:  Problem # 1:  ACUTE BRONCHITIS (ICD-466.0)  The following medications were removed from the medication list:    Augmentin 875-125 Mg Tabs (Amoxicillin-pot clavulanate) .Marland Kitchen... 1 by mouth two times a day Her updated medication list for this problem includes:    Zithromax Z-pak 250 Mg Tabs (Azithromycin) .Marland Kitchen... 2 by mouth once daily x1  then 1 by mouth once daily  Orders: Rocephin  250mg  (Z6109) Admin of Therapeutic Inj  intramuscular or subcutaneous ([address]) T-2 View CXR (71020TC)  Take antibiotics and other medications as directed. Encouraged to push clear liquids, get enough rest, and take acetaminophen as needed. To be seen in 5-7 days if no improvement, sooner if worse.  Complete Medication List: 1)  Ultram 50 Mg Tabs (Tramadol hcl) .... Take 1 tablet by mouth as directed 2)  Lyrica 50 Mg Caps (Pregabalin) .Marland Kitchen.. 1 once daily 3)  Glyburide 5 Mg Tabs (Glyburide) .... 2 tabs by mouth two times a day 4)  Actos 45 Mg Tabs (Pioglitazone hcl) .Marland Kitchen.. 1 once daily 5)  Lipitor 80 Mg Tabs (Atorvastatin calcium) .Marland Kitchen.. 1 at bedtime 6)  Gemfibrozil 600 Mg Tabs (Gemfibrozil) .Marland Kitchen.. 1 by mouth two times a day 7)  Glyburide 5 Mg Tabs (Glyburide) .... 2 by mouth two times a day 8)  Vicodin Es 7.5-750 Mg Tabs (Hydrocodone-acetaminophen) .Marland Kitchen.. 1 by mouth every 6 hours as needed 9)  Diovan 160 Mg Tabs (Valsartan) .Marland Kitchen.. 1 once daily 10)  Onglyza 5 Mg Tabs (Saxagliptin hcl) .... Take 1 tabs 11)  Nexium 40 Mg Cpdr (Esomeprazole magnesium) .Marland Kitchen.. 1 by mouth qd 12)  Vitamin D 09811 Unit Caps (Ergocalciferol) .... Take 1 tab weekly 13)  Zoloft 100 Mg Tabs (Sertraline hcl) .Marland Kitchen.. 1 by mouth once daily 14)  Zithromax Z-pak 250 Mg Tabs (Azithromycin) .... 2 by mouth once daily x1  then 1 by mouth once daily  Other Orders: Venipuncture (91478) TLB-Lipid Panel (80061-LIPID) TLB-BMP (Basic Metabolic Panel-BMET) (80048-METABOL) TLB-CBC Platelet -  w/Differential (85025-CBCD) TLB-Hepatic/Liver Function Pnl (80076-HEPATIC) TLB-A1C / Hgb A1C (Glycohemoglobin) (83036-A1C) TLB-Microalbumin/Creat Ratio, Urine (82043-MALB) UA Dipstick w/o Micro (manual) (29562) Prescriptions: ZOLOFT 100 MG TABS (SERTRALINE HCL) 1 by mouth once daily  #30 x 11   Entered and Authorized by:   Loreen Freud DO   Signed by:   Loreen Freud DO on 02/03/2009   Method used:   Electronically to        [id number] (retail)       [address] [place], [place]  [address]       Ph: [phone]       Fax: [phone]   RxID:   [id number] ZITHROMAX Z-PAK 250 MG TABS (AZITHROMYCIN) 2 by mouth once daily x1  then 1 by mouth once daily  #1 x 0   Entered and Authorized by:   Loreen Freud DO  Signed by:   Loreen Freud DO on 02/03/2009   Method used:   Electronically to        [id number] (retail)       [address] [place], [place]  [address]       Ph: [phone]       Fax: [phone]   RxID:   [id number]    Medication Administration  Injection # 1:    Medication: Rocephin  250mg     Diagnosis: ACUTE BRONCHITIS (ICD-466.0)    Route: IM    Site: RUOQ gluteus    Exp Date: 07/17/2011    Lot #: [id number]    Mfr: [email or web address]    Patient tolerated injection without complications    Given by: [name] (February 03, 2009 11:11 AM)  Orders Added: 1)  Venipuncture [72536] 2)  TLB-Lipid Panel [80061-LIPID] 3)  TLB-BMP (Basic Metabolic Panel-BMET) [80048-METABOL] 4)  TLB-CBC Platelet - w/Differential [85025-CBCD] 5)  TLB-Hepatic/Liver Function Pnl [80076-HEPATIC] 6)  TLB-A1C / Hgb A1C (Glycohemoglobin) [83036-A1C] 7)  TLB-Microalbumin/Creat Ratio, Urine [82043-MALB] 8)  Rocephin  250mg  [J0696] 9)  Admin of Therapeutic Inj  intramuscular or subcutaneous [96372] 10)  UA Dipstick w/o Micro (manual) [81002] 11)  T-2 View CXR [71020TC] 12)  Est. Patient Level IV [64403]  Laboratory Results   Urine  Tests   Date/Time Reported: February 03, 2009 11:13 AM   Routine Urinalysis   Color: yellow Appearance: Clear Glucose: negative   (Normal Range: Negative) Bilirubin: negative   (Normal Range: Negative) Ketone: negative   (Normal Range: Negative) Spec. Gravity: 1.025   (Normal Range: 1.003-1.035) Blood: negative   (Normal Range: Negative) Protein: negative   (Normal Range: Negative) Urobilinogen: negative   (Normal Range: 0-1) Nitrite: negative   (Normal Range: Negative) Leukocyte Esterace: negative   (Normal Range: Negative)    Comments: Floydene Flock CMA  February 03, 2009 11:14 AM

## 2010-08-16 NOTE — Letter (Signed)
Summary: Letter of Medical Necessity for Patient Insurance to [employer]  Letter of Medical Necessity for Patient Insurance to [facility] Local Providers/[facility]   Imported By: Lanelle Bal 11/13/2008 11:41:54  _____________________________________________________________________  External Attachment:    Type:   Image     Comment:   External Document

## 2010-08-16 NOTE — Assessment & Plan Note (Signed)
Summary: COUGH/SORE THROAT/KN   Vital Signs:  Patient profile:   55 year old female Weight:      220.4 pounds O2 Sat:      98 % on Room air Temp:     97.8 degrees F oral Pulse rate:   100 / minute Pulse rhythm:   regular BP sitting:   130 / 82  (left arm) Cuff size:   regular  Vitals Entered By: Almeta Monas CMA Duncan Dull) (May 10, 2010 4:01 PM)  O2 Flow:  Room air CC: c/o LBP, sore throat, Headache, fever and sinus pressure, URI symptoms   History of Present Illness:       This is a 55 year old woman who presents with URI symptoms.  The symptoms began 2 weeks ago.  Pt is not taking any otc meds.   pt got better and then got sick again few days later.  The patient complains of nasal congestion, purulent nasal discharge, dry cough, earache, and sick contacts.  The patient denies fever, low-grade fever (<100.5 degrees), fever of 100.5-103 degrees, fever of 103.1-104 degrees, fever to >104 degrees, stiff neck, dyspnea, wheezing, rash, vomiting, diarrhea, use of an antipyretic, and response to antipyretic.  The patient also reports headache.  The patient denies the following risk factors for Strep sinusitis: unilateral facial pain, unilateral nasal discharge, poor response to decongestant, double sickening, tooth pain, Strep exposure, tender adenopathy, and absence of cough.    Current Medications (verified): 1)  Actos 45 Mg  Tabs (Pioglitazone Hcl) .Marland Kitchen.. 1 Once Daily 2)  Lipitor 80 Mg  Tabs (Atorvastatin Calcium) .Marland Kitchen.. 1 At Bedtime 3)  Diovan 160 Mg  Tabs (Valsartan) .Marland Kitchen.. 1 Once Daily 4)  Onglyza 5 Mg Tabs (Saxagliptin Hcl) .... Take 1 Tabs 5)  Zoloft 100 Mg Tabs (Sertraline Hcl) .... 2 By Mouth Once Daily 6)  Glyburide 2.5 Mg Tabs (Glyburide) .Marland Kitchen.. 1 Bid 7)  Metformin Hcl 500 Mg Xr24h-Tab (Metformin Hcl) .... Once Daily At 2pm 8)  Freestyle Test  Strp (Glucose Blood) .... Check Two Times A Day. 9)  Nexium 40 Mg Cpdr (Esomeprazole Magnesium) .... Two Times A Day 10)  Tricor 145 Mg Tabs  (Fenofibrate) .Marland Kitchen.. 1 By Mouth Daily 11)  Bayer Low Strength 81 Mg Tbec (Aspirin) .... Take Once Daily 12)  Alprazolam 0.25 Mg Tabs (Alprazolam) .Marland Kitchen.. 1 By Mouth Three Times A Day As Needed 13)  Cheratussin Ac 100-10 Mg/8ml Syrp (Guaifenesin-Codeine) .Marland Kitchen.. 1-2 Tsp By Mouth At Bedtime As Needed 14)  Nasonex 50 Mcg/act Susp (Mometasone Furoate) .... 2 Sprays Each Nostril Once Daily 15)  Ceftin 500 Mg Tabs (Cefuroxime Axetil) .Marland Kitchen.. 1 By Mouth Two Times A Day  Allergies (verified): No Known Drug Allergies  Past History:  Past medical, surgical, family and social histories (including risk factors) reviewed for relevance to current acute and chronic problems.  Past Medical History: Reviewed history from 10/28/2009 and no changes required. 1. Chest pain    --cath 10 years ago ok    --myoview 2007 and 05/2009: normal 2. Depression 3. Diabetes mellitus, type II 4. GERD 5. Hypertension 6. Hyperlipidemia 7. 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 G6 P 2  Family History: Reviewed history from 07/05/2009 and no changes required. Father:arthritis, ? CA  Mother: DM (deceased) Siblings:none   Social History: Reviewed history from 08/10/2009 and no changes required. no diet Retired Married Drug use-no Regular exercise-no Never Smoked Alcohol  use[place]  Review of Systems      See HPI  Physical Exam  General:  Well-developed,well-nourished,in no acute distress; alert,appropriate and cooperative throughout examination Ears:  External ear exam shows no significant lesions or deformities.  Otoscopic examination reveals clear canals, tympanic membranes are intact bilaterally without bulging, retraction, inflammation or discharge. Hearing is grossly normal bilaterally. Nose:  mucosal erythema, mucosal edema, L frontal sinus tenderness, L maxillary sinus tenderness, R frontal  sinus tenderness, and R maxillary sinus tenderness.   Mouth:  Oral mucosa and oropharynx without lesions or exudates.  Teeth in good repair. Neck:  No deformities, masses, or tenderness noted. Lungs:  Normal respiratory effort, chest expands symmetrically. Lungs are clear to auscultation, no crackles or wheezes. Heart:  Normal rate and regular rhythm. S1 and S2 normal without gallop, murmur, click, rub or other extra sounds. Extremities:  No clubbing, cyanosis, edema, or deformity noted with normal full range of motion of all joints.   Psych:  Cognition and judgment appear intact. Alert and cooperative with normal attention span and concentration. No apparent delusions, illusions, hallucinations   Impression & Recommendations:  Problem # 1:  SINUSITIS - ACUTE-NOS (ICD-461.9)  Her updated medication list for this problem includes:    Cheratussin Ac 100-10 Mg/44ml Syrp (Guaifenesin-codeine) .Marland Kitchen... 1-2 tsp by mouth at bedtime as needed    Nasonex 50 Mcg/act Susp (Mometasone furoate) .Marland Kitchen... 2 sprays each nostril once daily    Ceftin 500 Mg Tabs (Cefuroxime axetil) .Marland Kitchen... 1 by mouth two times a day  Instructed on treatment. Call if symptoms persist or worsen.   Orders: Prescription Created Electronically [phone])  Problem # 2:  ANXIETY STATE, UNSPECIFIED (ICD-300.00)  Her updated medication list for this problem includes:    Zoloft 100 Mg Tabs (Sertraline hcl) .Marland Kitchen... 2 by mouth once daily    Alprazolam 0.25 Mg Tabs (Alprazolam) .Marland Kitchen... 1 by mouth three times a day as needed  Orders: Prescription Created Electronically [id number])  Complete Medication List: 1)  Actos 45 Mg Tabs (Pioglitazone hcl) .Marland Kitchen.. 1 once daily 2)  Lipitor 80 Mg Tabs (Atorvastatin calcium) .Marland Kitchen.. 1 at bedtime 3)  Diovan 160 Mg Tabs (Valsartan) .Marland Kitchen.. 1 once daily 4)  Onglyza 5 Mg Tabs (Saxagliptin hcl) .... Take 1 tabs 5)  Zoloft 100 Mg Tabs (Sertraline hcl) .... 2 by mouth once daily 6)  Glyburide 2.5 Mg Tabs (Glyburide) .Marland Kitchen.. 1  bid 7)  Metformin Hcl 500 Mg Xr24h-tab (Metformin hcl) .... Once daily at 2pm 8)  Freestyle Test Strp (Glucose blood) .... Check two times a day. 9)  Nexium 40 Mg Cpdr (Esomeprazole magnesium) .... Two times a day 10)  Tricor 145 Mg Tabs (Fenofibrate) .Marland Kitchen.. 1 by mouth daily 11)  Bayer Low Strength 81 Mg Tbec (Aspirin) .... Take once daily 12)  Alprazolam 0.25 Mg Tabs (Alprazolam) .Marland Kitchen.. 1 by mouth three times a day as needed 13)  Cheratussin Ac 100-10 Mg/39ml Syrp (Guaifenesin-codeine) .Marland Kitchen.. 1-2 tsp by mouth at bedtime as needed 14)  Nasonex 50 Mcg/act Susp (Mometasone furoate) .... 2 sprays each nostril once daily 15)  Ceftin 500 Mg Tabs (Cefuroxime axetil) .Marland Kitchen.. 1 by mouth two times a day  Patient Instructions: 1)  Please schedule a follow-up appointment in 2 weeks-- f/u anxiety--30 min Prescriptions: ALPRAZOLAM 0.25 MG TABS (ALPRAZOLAM) 1 by mouth three times a day as needed  #60 x 0   Entered and Authorized by:   Loreen Freud DO   Signed by:   Loreen Freud DO on  05/10/2010   Method used:   Print then Give to Patient   RxID:   [id number] CHERATUSSIN AC 100-10 MG/5ML SYRP (GUAIFENESIN-CODEINE) 1-2 tsp by mouth at bedtime as needed  #6 oz x 0   Entered and Authorized by:   Loreen Freud DO   Signed by:   Loreen Freud DO on 05/10/2010   Method used:   Print then Give to Patient   RxID:   [id number] CEFTIN 500 MG TABS (CEFUROXIME AXETIL) 1 by mouth two times a day  #20 x 0   Entered and Authorized by:   Loreen Freud DO   Signed by:   Loreen Freud DO on 05/10/2010   Method used:   Electronically to        [id number]* (retail)       [address] [place], [place]  [address]       Ph: [phone]       Fax: [phone]   RxID:   [id number]    Orders Added: 1)  Est. Patient Level IV [[address]] 2)  Prescription Created Electronically [id number]    Laboratory Results   Urine Tests    Routine Urinalysis   Color: yellow Appearance: Hazy Glucose:  negative   (Normal Range: Negative) Bilirubin: negative   (Normal Range: Negative) Ketone: trace (5)   (Normal Range: Negative) Spec. Gravity: 1.020   (Normal Range: 1.003-1.035) Blood: negative   (Normal Range: Negative) pH: 5.0   (Normal Range: 5.0-8.0) Protein: trace   (Normal Range: Negative) Urobilinogen: 0.2   (Normal Range: 0-1) Nitrite: negative   (Normal Range: Negative) Leukocyte Esterace: negative   (Normal Range: Negative)

## 2010-08-16 NOTE — Letter (Signed)
Summary: Results Follow up Letter  [facility]  [address] [place], [place] [address]   Phone: [phone]  Fax: [phone]    07/24/2008 MRN: [id number]  [facility] [address] [place], [place]  [address]  Dear Ms. Rutt,  The following are the results of your recent test(s):  Test         Result    Pap Smear:        Normal _____  Not Normal _____ Comments: ______________________________________________________ Cholesterol: LDL(Bad cholesterol):         Your goal is less than:         HDL (Good cholesterol):       Your goal is more than: Comments:  ______________________________________________________ Mammogram:        Normal __X___  Not Normal _____ Comments:  ___________________________________________________________________ Hemoccult:        Normal _____  Not normal _______ Comments:    _____________________________________________________________________ Other Tests:    We routinely do not discuss normal results over the telephone.  If you desire a copy of the results, or you have any questions about this information we can discuss them at your next office visit.   Sincerely,         Jeremy Johann CMA

## 2010-08-16 NOTE — Progress Notes (Signed)
Summary: STRESS TEST REFERRAL  Phone Note Call from Patient Call back at Home Phone [phone]   Caller: Patient Call For: Loreen Freud DO Reason for Call: Referral Summary of Call: PATIENT CALLING STATES UPON DISCHARGE FROM HOSPITAL, SHE WAS INSTRUCTED TO CONTACT OUR OFFICE & DR. Laury Axon TO REFER HER FOR A STRESS TEST.  ARE YOU REFERRING FOR STRESS TEST, AND WHICH KIND? Initial call taken by: Magdalen Spatz [facility],  May 18, 2009 2:42 PM  Follow-up for Phone Call        done Follow-up by: Loreen Freud DO,  May 18, 2009 2:59 PM

## 2010-08-16 NOTE — Progress Notes (Signed)
  Phone Note Call from Patient   Summary of Call: Patient called and stated that she needs a refill on Test Strips for Freestyle glucometer sent to [facility] on [address]. Army Fossa CMA  October 18, 2009 11:01 AM     New/Updated Medications: FREESTYLE TEST  STRP (GLUCOSE BLOOD) check two times a day. Prescriptions: FREESTYLE TEST  STRP (GLUCOSE BLOOD) check two times a day.  #60 x 11   Entered by:   Army Fossa CMA   Authorized by:   Loreen Freud DO   Signed by:   Army Fossa CMA on 10/18/2009   Method used:   Electronically to        [id number]* (retail)       [address] [place], [place]  [address]       Ph: [phone]       Fax: [phone]   RxID:   [id number]

## 2010-08-16 NOTE — Progress Notes (Signed)
Summary: try later 07-20-08  Phone Note Outgoing Call   Call placed by: [facility] CMA,  July 20, 2008 8:54 AM Call placed to: Patient Summary of Call: TRIED TO CALL PT NO ANSWER OR MACHINE WILL TRY LATER.  + yeast----   diflucan 150  #2  1 by mouth once daily for 1 day --may repeat in 1 week as needed    Initial call taken by: Jeremy Johann CMA,  July 20, 2008 8:55 AM  Follow-up for Phone Call        pt husband aware rx resent to correct pharmacy. lab s mailed to pt Follow-up by: [facility] CMA,  July 29, 2008 10:43 AM    New/Updated Medications: DIFLUCAN 150 MG TABS (FLUCONAZOLE) Take 1 by mouth once daily for 1 day may repeat in 1 week as needed   Prescriptions: DIFLUCAN 150 MG TABS (FLUCONAZOLE) Take 1 by mouth once daily for 1 day may repeat in 1 week as needed  #2 x 0   Entered by:   Jeremy Johann CMA   Authorized by:   Loreen Freud DO   Signed by:   Jeremy Johann CMA on 07/29/2008   Method used:   Electronically to        [id number]* (retail)       [address] [place], [place]  [address]       Ph: [phone]       Fax: [phone]   RxID:   [id number] DIFLUCAN 150 MG TABS (FLUCONAZOLE) Take 1 by mouth once daily for 1 day may repeat in 1 week as needed  #2 x 0   Entered by:   Jeremy Johann CMA   Authorized by:   Loreen Freud DO   Signed by:   Jeremy Johann CMA on 07/20/2008   Method used:   Electronically to        [facility] (retail)       [address]       [place], [place]  [address]       Ph: [phone]       Fax: [phone]   RxID:   [id number]

## 2010-08-16 NOTE — Progress Notes (Signed)
Summary: FYI-ortho referral  Phone Note Outgoing Call   Summary of Call: dr. Laury [facility]on [facility] [address] [phone] dr Mina Marble 9.09.08 @ 2:15 pt number not working Oncologist ..................................................................[name]  March 15, 2007 10:18 AM  pt has new number [phone] they have changed insurance companies to [employer] and they require the pt to pay $1000 in network out of pocket and $2000 out of network out of pocket. explained this to pt and they are checking on this and will get back with me to let me know what he wants to do in reference to his wifes appt ..................................................................[name]  March 26, 2007 4:12 PM   Initial call taken by: [name],  Dec 05, 2007 2:43 PM  Follow-up for Phone Call        lmom.Marland KitchenMarland KitchenMarland KitchenCharolette Child  Dec 04, 2007 8:41 AM  Additional Follow-up for Phone Call Additional follow up Details #1::        patient said that she wanted to wait until she speaks with the doctor before making the appt. Patient states that she has been going back and forth to [place] to [place] of her sick father. Marland KitchenCharolette Child  Dec 05, 2007 2:43 PM    Additional Follow-up for Phone Call Additional follow up Details #2::    ok  Additional Follow-up for Phone Call Additional follow up Details #3:: Details for Additional Follow-up Action Taken: Thanks Tiff.  [name]  Dec 06, 2007 4:28 PM

## 2010-08-16 NOTE — Letter (Signed)
Summary: Results Follow-up Letter  [facility]  [address] [place], [place] [address]   Phone: [phone]  Fax: [phone]    10/23/2007        Donna Pham [address] [place], [place]  [address]  Dear [name],   The following are the results of your recent test(s):  Test     Result     Pap Smear    Normal_______  Not Normal_____       Comments: _________________________________________________________ Cholesterol LDL(Bad cholesterol):          Your goal is less than:         HDL (Good cholesterol):        Your goal is more than: _________________________________________________________ Other Tests:   _________________________________________________________  Please call for an appointment Or _Please see attached and prescription sent to your pharmacy. ________________________________________________________ _________________________________________________________ _________________________________________________________  Sincerely,  [name] [facility]

## 2010-08-16 NOTE — Assessment & Plan Note (Signed)
Summary: ?SPIDER BITE LEFT WRIST/RH......   Vital Signs:  Patient profile:   55 year old female Weight:      226.4 pounds Temp:     98.4 degrees F oral BP sitting:   120 / 80  (left arm)  Vitals Entered By: Doristine Devoid (April 09, 2009 2:46 PM) CC: INSECT BITE ON L WRIST X4-5 DAYS   History of Present Illness: 55 yo woman here today for bite on L wrist.  first noticed 4-5 days ago.  has worsened over the last few days, especially after using neosporin.  no fevers.  + itching but no pain.  no drainage.  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  central punctate area, no evidence of foreign body, no pus present.  no surrounding redness or induration   Impression & Recommendations:  Problem # 1:  CELLULITIS/ABSCESS, ARM (ICD-682.3) Assessment New pt's area likely a rxn to the neosporin more than infxn from initial bite.  start Keflex for infxn and steroid ointment for likely contact dermatitis due to neosporin exposure.  reviewed supportive care and red flags that should prompt return.  Pt expresses understanding and is in agreement w/ this plan. Her updated medication list for this problem includes:    Cephalexin 500 Mg Tabs (Cephalexin) .Marland Kitchen... Take one by mouth two times a day- take w/ food.  Complete Medication List: 1)  Glyburide 5 Mg Tabs (Glyburide) .... 2 tabs by mouth two times a day 2)  Actos 45 Mg Tabs (Pioglitazone hcl) .Marland Kitchen.. 1 once daily 3)  Lipitor 80 Mg Tabs (Atorvastatin calcium) .Marland Kitchen.. 1 at  bedtime 4)  Glyburide 5 Mg Tabs (Glyburide) .... 2 by mouth two times a day 5)  Diovan 160 Mg Tabs (Valsartan) .Marland Kitchen.. 1 once daily 6)  Onglyza 5 Mg Tabs (Saxagliptin hcl) .... Take 1 tabs 7)  Zoloft 100 Mg Tabs (Sertraline hcl) .Marland Kitchen.. 1 by mouth once daily 8)  Cephalexin 500 Mg Tabs (Cephalexin) .... Take one by mouth two times a day- take w/ food. 9)  Triamcinolone Acetonide 0.1 % Oint (Triamcinolone acetonide) .... Apply to affected area two times a day.  disp 1 large tube.  Patient Instructions: 1)  Please schedule a follow-up appointment as needed 2)  Take the antibiotics as directed- two times a day x7 days, take with food 3)  Use the steroid cream two times a day for the itching and to help healing 4)  Use Benadryl as needed for itching 5)  STOP the neosporin!! 6)  If no better in the next 7-10 days, please call 7)  Hang in there!  Prescriptions: TRIAMCINOLONE ACETONIDE 0.1 % OINT (TRIAMCINOLONE ACETONIDE) apply to affected area two times a day.  disp 1 large tube.  #1 x 0   Entered and Authorized by:   Neena Rhymes MD   Signed by:   Neena Rhymes MD on 04/09/2009   Method used:   Electronically to        [id number]* (retail)       [address]  [place], [place]  [address]       Ph: [phone]       Fax: [phone]   RxID:   [id number] CEPHALEXIN 500 MG  TABS (CEPHALEXIN) take one by mouth two times a day- take w/ food.  #14 x 0   Entered and Authorized by:   Neena Rhymes MD   Signed by:   Neena Rhymes MD on 04/09/2009   Method used:   Electronically to        [id number]* (retail)       [address] [place], [place]  [address]       Ph: [phone]       Fax: [phone]   RxID:   [id number]

## 2010-08-16 NOTE — Assessment & Plan Note (Signed)
Summary: CHECK HER BP AND SUGAR FROM A SPIDER BITE//PH   Vital Signs:  Patient profile:   55 year old female Weight:      229 pounds Temp:     98.2 degrees F oral Pulse rate:   97 / minute Pulse rhythm:   regular BP sitting:   130 / 86  (left arm) Cuff size:   large  Vitals Entered By: Army Fossa CMA (May 14, 2009 10:51 AM)  CC: BP check, has had chest pain off and on x 2 weeks.    History of Present Illness:       This is a 55 year old woman who presents with Chest Pain.  The symptoms began 2 weeks ago.  The patient reports resting chest pain, but denies exertional chest pain, nausea, vomiting, diaphoresis, shortness of breath, palpitations, dizziness, light headedness, syncope, and indigestion.  The pain is described as intermittent and band-like.  The pain is located in the left anterior chest and neck.  The pain radiates to the left arm and back.  Episodes of chest pain last 5-10 minutes.  The pain is brought on or made worse by any activity and emotional stress.  The pain is relieved or improved with rest.    Preventive Screening-Counseling & Management  Alcohol-Tobacco     Alcohol drinks/day: <1     Smoking Status: never  Caffeine-Diet-Exercise     Caffeine use/day: 1     Does Patient Exercise: no     Type of exercise: minimal  Safety-Violence-Falls     Seat Belt Use: 100  Current Medications (verified): 1)  Glyburide 5 Mg  Tabs (Glyburide) .... 2 Tabs By Mouth Two Times A Day 2)  Actos 45 Mg  Tabs (Pioglitazone Hcl) .Marland Kitchen.. 1 Once Daily 3)  Lipitor 80 Mg  Tabs (Atorvastatin Calcium) .Marland Kitchen.. 1 At Bedtime 4)  Glyburide 5 Mg  Tabs (Glyburide) .... 2 By Mouth Two Times A Day 5)  Diovan 160 Mg  Tabs (Valsartan) .Marland Kitchen.. 1 Once Daily 6)  Onglyza 5 Mg Tabs (Saxagliptin Hcl) .... Take 1 Tabs 7)  Zoloft 100 Mg Tabs (Sertraline Hcl) .... 2 By Mouth Once Daily 8)  Triamcinolone Acetonide 0.1 % Oint (Triamcinolone Acetonide) .... Apply To Affected Area Two Times A Day.  Disp 1  Large Tube. 9)  Fluconazole 150 Mg Tabs (Fluconazole) .Marland Kitchen.. 1 By Mouth X1 , May Repeat in 1 Week As Needed 10)  Lotrimin Af 1 % Crea (Clotrimazole) .... 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 CA  Mother: DM Siblings:none   Social History: Last updated: 07/13/2008 no diet Retired Married Drug use-no Regular exercise-no Never Smoked Alcohol use-no  Risk Factors: Alcohol Use: <1 (05/14/2009) Caffeine Use: 1 (05/14/2009) Exercise: no (05/14/2009)  Risk Factors: Smoking Status: never (05/14/2009)  Family History: Reviewed history from 12/30/2007 and no changes required. Father:arthritis, ? [place]  Mother: DM Siblings:none   Social History: Reviewed history from 07/13/2008 and no changes required. no diet Retired Married Drug use-no Regular exercise-no Never Smoked Alcohol use-no  Review of Systems      See HPI  Physical Exam  General:  alert and overweight-appearing.   Ears:  External ear exam shows no significant lesions or deformities.  Otoscopic examination reveals clear canals, tympanic membranes are intact bilaterally without bulging, retraction, inflammation or discharge. Hearing is grossly normal bilaterally. Nose:  External nasal examination shows no deformity or inflammation. Nasal mucosa are pink and moist without lesions or exudates. Mouth:  Oral mucosa and oropharynx without lesions or exudates.  Teeth in good repair. Neck:  No deformities, masses, or tenderness noted. Lungs:  Normal respiratory effort, chest expands symmetrically. Lungs are clear to auscultation, no crackles or wheezes. Heart:  normal rate.   Abdomen:   Bowel sounds positive,abdomen soft and non-tender without masses, organomegaly or hernias noted. Msk:  normal ROM.   Neurologic:  alert & oriented X3, cranial nerves II-XII intact, strength normal in all extremities, and gait normal.   Skin:  Intact without suspicious lesions or rashes Cervical Nodes:  No lymphadenopathy noted Psych:  Cognition and judgment appear intact. Alert and cooperative with normal attention span and concentration. No apparent delusions, illusions, hallucinations   Impression & Recommendations:  Problem # 1:  CHEST PAIN UNSPECIFIED (ICD-786.50)  admit to telemetry 24 h obs see admit orders  Orders: No Charge Patient Arrived (NCPA0) (NCPA0)  Problem # 2:  HYPERLIPIDEMIA (ICD-272.4)  Her updated medication list for this problem includes:    Lipitor 80 Mg Tabs (Atorvastatin calcium) .Marland Kitchen... 1 at bedtime  Labs Reviewed: SGOT: 24 (02/03/2009)   SGPT: 21 (02/03/2009)   HDL:42.50 (02/03/2009), 46.20 (10/05/2008)  LDL:66 (10/05/2008), 108 (16/04/9603)  Chol:250 (02/03/2009), 130 (10/05/2008)  Trig:148.0 (02/03/2009), 89.0 (10/05/2008)  Orders: No Charge Patient Arrived (NCPA0) (NCPA0)  Problem # 3:  HYPERTENSION (ICD-401.9)  Her updated medication list for this problem includes:    Diovan 160 Mg Tabs (Valsartan) .Marland Kitchen... 1 once daily  BP today: 130/86 Prior BP: 122/84 (04/20/2009)  Labs Reviewed: K+: 4.3 (02/03/2009) Creat: : 0.8 (02/03/2009)   Chol: 250 (02/03/2009)   HDL: 42.50 (02/03/2009)   LDL: 66 (10/05/2008)   TG: 148.0 (02/03/2009)  Orders: No Charge Patient Arrived (NCPA0) (NCPA0)  Problem # 4:  DIABETES MELLITUS, TYPE II (ICD-250.00)  Her updated medication list for this problem includes:    Glyburide 5 Mg Tabs (Glyburide) .Marland Kitchen... 2 tabs by mouth two times a day    Actos 45 Mg Tabs (Pioglitazone hcl) .Marland Kitchen... 1 once daily    Glyburide 5 Mg Tabs (Glyburide) .Marland Kitchen... 2 by mouth two times a day    Diovan 160 Mg Tabs (Valsartan) .Marland Kitchen... 1 once daily     Onglyza 5 Mg Tabs (Saxagliptin hcl) .Marland Kitchen... Take 1 tabs  Labs Reviewed: Creat: 0.8 (02/03/2009)    Reviewed HgBA1c results: 7.6 (02/03/2009)  6.8 (10/05/2008)  Orders: No Charge Patient Arrived (NCPA0) (NCPA0)  Complete Medication List: 1)  Glyburide 5 Mg Tabs (Glyburide) .... 2 tabs by mouth two times a day 2)  Actos 45 Mg Tabs (Pioglitazone hcl) .Marland Kitchen.. 1 once daily 3)  Lipitor 80 Mg Tabs (Atorvastatin calcium) .Marland Kitchen.. 1 at bedtime 4)  Glyburide 5 Mg Tabs (Glyburide) .... 2 by mouth two times a day 5)  Diovan 160 Mg Tabs (Valsartan) .Marland Kitchen.. 1 once daily 6)  Onglyza 5 Mg Tabs (Saxagliptin hcl) .... Take 1 tabs 7)  Zoloft 100 Mg Tabs (Sertraline hcl) .... 2 by mouth once daily 8)  Triamcinolone Acetonide 0.1 % Oint (Triamcinolone acetonide) .... Apply to affected area two times a day.  disp 1 large tube. 9)  Fluconazole 150 Mg Tabs (Fluconazole) .Marland Kitchen.. 1 by mouth x1 , may repeat in 1 week as needed 10)  Lotrimin Af 1 % Crea (  Clotrimazole) .... As directed Prescriptions: ZOLOFT 100 MG TABS (SERTRALINE HCL) 2 by mouth once daily  #60 x 2   Entered and Authorized by:   Loreen Freud DO   Signed by:   Loreen Freud DO on 05/14/2009   Method used:   Electronically to        [facility] (retail)       [address]       [place], [place]  [address]       Ph: [phone]       Fax: [phone]   RxID:   [id number]   Appended Document: CHECK HER BP AND SUGAR FROM A SPIDER BITE//PH Pt refused ambulance transportation to hospital.

## 2010-08-16 NOTE — Discharge Summary (Signed)
    CT of Chest  Procedure date:  01/02/2008  Findings:       IMPRESSION:   1. No evidence of acute pulmonary embolus.   2.  Right lower lobe pneumonia.  No emphysema.  No pleural   effusion.]]    Read By:  Augusto Gamble,  M.D.   Released By:  Augusto Gamble,  M.D.  Additional Information  HL7 RESULT STATUS : F  External image : [id number]  External IF Update Timestamp : 2008-01-02:16:52:46.000000   CXR  Procedure date:  12/31/2007  Findings:      Clinical Data: Abdominal pain.  Nausea and vomiting.  Fever.   Chills.  Cough.    CHEST - 2 VIEW    Comparison: 03/27/2005    Findings: Subsegmental atelectasis at the lung bases.  Airspace   opacity at the right base as well consistent with pneumonia.   Cardiomediastinal silhouette unremarkable.    IMPRESSION:   Right lower lobe pneumonia and bibasilar subsegmental atelectasis.    Read By:  Jonne Ply,  M.D.   Released By:  Jonne Ply,  M.D.  Additional Information  HL7 RESULT STATUS : F  External image : [id number]  External IF Update Timestamp : 2007-12-31:08:45:52.000000 abnormal:     CT of Chest  Procedure date:  01/02/2008  Findings:       IMPRESSION:   1. No evidence of acute pulmonary embolus.   2.  Right lower lobe pneumonia.  No emphysema.  No pleural   effusion.]]    Read By:  Augusto Gamble,  M.D.   Released By:  Augusto Gamble,  M.D.  Additional Information  HL7 RESULT STATUS : F  External image : [id number]  External IF Update Timestamp : 2008-01-02:16:52:46.000000   CXR  Procedure date:  12/31/2007  Findings:      Clinical Data: Abdominal pain.  Nausea and vomiting.  Fever.   Chills.  Cough.    CHEST - 2 VIEW    Comparison: 03/27/2005    Findings: Subsegmental atelectasis at the lung bases.  Airspace   opacity at the right base as well consistent with pneumonia.   Cardiomediastinal silhouette unremarkable.    IMPRESSION:   Right lower lobe pneumonia and  bibasilar subsegmental atelectasis.    Read By:  Jonne Ply,  M.D.   Released By:  Jonne Ply,  M.D.  Additional Information  HL7 RESULT STATUS : F  External image : [id number]  External IF Update Timestamp : 2007-12-31:08:45:52.000000 abnormal:

## 2010-08-16 NOTE — Letter (Signed)
Summary: Primary Care Consult Scheduled Letter  [place] [facility] [place]  [address] [place], [place] [address]   Phone: [phone]  Fax: [phone]      07/02/2008 MRN: [id number]  [facility] [address] [place], [place]  [address]    Dear Donna Pham,      We have scheduled an appointment for you.  At the recommendation of Dr.Lowne, we have scheduled you a consult with Dr. Russella Dar at [facility] on January 12th at 2:15pm.  Their address is [address] [place]. The office phone number is [phone].  If this appointment day and time is not convenient for you, please feel free to call the office of the doctor you are being referred to at the number listed above and reschedule the appointment.     It is important for you to keep your scheduled appointments. We are here to make sure you are given good patient care. If you have questions or you have made changes to your appointment, please notify us at  [phone], ask for Tiffany.    Thank you,  Patient Care Coordinator  at [facility]

## 2010-08-16 NOTE — Letter (Signed)
Summary: Primary Care Consult Scheduled Letter  [place] [facility] [place]  [address] [place], [place] [address]   Phone: [phone]  Fax: [phone]      07/03/2008 MRN: [id number]  [facility] [address] [place], [place]  [address]    Dear Ms. Leibensperger,      We have scheduled an appointment for you.  At the recommendation of Dr.Lowne, we have scheduled you a mammogram at [facility] on January 12th at 3:00pm.  Their address is [address] [place]. The office phone number is [phone].  If this appointment day and time is not convenient for you, please feel free to call the office of the doctor you are being referred to at the number listed above and reschedule the appointment.     It is important for you to keep your scheduled appointments. We are here to make sure you are given good patient care. If you have questions or you have made changes to your appointment, please notify us at  [phone], ask for Tiffany.    Thank you,  Patient Care Coordinator [place] at [facility]

## 2010-08-16 NOTE — Letter (Signed)
Summary: Primary Care Consult Scheduled Letter  [place] [facility] [place]  [address] [place], [place] [address]   Phone: [phone]  Fax: [phone]      06/03/2008 MRN: [id number]  [facility] [place] [address] [place], [place]  [address]    Dear Ms. Heckert,      We have scheduled an appointment for you.  At the recommendation of Dr.Lowne, we have scheduled you a consult with Dr. Harriet Pho at [facility] on November 24th at 10:40am.  Their address is [address] [place]. The office phone number is [phone].  If this appointment day and time is not convenient for you, please feel free to call the office of the doctor you are being referred to at the number listed above and reschedule the appointment.     It is important for you to keep your scheduled appointments. We are here to make sure you are given good patient care. If you have questions or you have made changes to your appointment, please notify us at  [phone], ask for Tiffany.    Thank you,  Patient Care Coordinator [place] at [facility]

## 2010-08-16 NOTE — Letter (Signed)
Summary: Results Follow-up Letter  [place] at [facility]  [address] [place], [place] [address]   Phone: [phone]  Fax: [phone]    10/23/2007        Kory Panjwani [address] [place], [place]  [address]  Dear Ms. Rademaker,   The following are the results of your recent test(s):  Test     Result     Pap Smear    Normal_______  Not Normal_____       Comments: _________________________________________________________ Cholesterol LDL(Bad cholesterol):          Your goal is less than:         HDL (Good cholesterol):        Your goal is more than: _________________________________________________________ Other Tests:   _________________________________________________________  Please call for an appointment Or __Please see attached._______________________________________________________ _________________________________________________________ _________________________________________________________  Sincerely,  [name] Kendall at [facility]

## 2010-08-16 NOTE — Assessment & Plan Note (Signed)
Summary: follow up-lb   Vital Signs:  Patient profile:   55 year old female Height:      66 inches (167.64 cm) Weight:      214.25 pounds (97.39 kg) BMI:     34.71 O2 Sat:      93 % on Room air Temp:     97.9 degrees F (36.61 degrees C) oral Pulse rate:   78 / minute Pulse rhythm:   regular BP sitting:   112 / 80  (left arm) Cuff size:   regular  Vitals Entered By: Brenton Grills MA (May 04, 2010 8:27 AM)  O2 Flow:  Room air CC: Follow-up visit/aj Is Patient Diabetic? Yes   Referring Provider:  Loreen Freud DO Primary Provider:  Laury Axon  CC:  Follow-up visit/aj.  History of Present Illness: no cbg record, but states cbg's vary from 100-400.  it is in ganeral higher in am than later in the day.  it was 400 only once. most are in the 200's.  she cites deasth of her father as the cause of her elevated cbg's.   she has multiple sxs unrelated to dm.  Current Medications (verified): 1)  Actos 45 Mg  Tabs (Pioglitazone Hcl) .Marland Kitchen.. 1 Once Daily 2)  Lipitor 80 Mg  Tabs (Atorvastatin Calcium) .Marland Kitchen.. 1 At Bedtime 3)  Diovan 160 Mg  Tabs (Valsartan) .Marland Kitchen.. 1 Once Daily 4)  Onglyza 5 Mg Tabs (Saxagliptin Hcl) .... Take 1 Tabs 5)  Zoloft 100 Mg Tabs (Sertraline Hcl) .... 2 By Mouth Once Daily 6)  Glyburide 2.5 Mg Tabs (Glyburide) .Marland Kitchen.. 1 Bid 7)  Metformin Hcl 500 Mg Xr24h-Tab (Metformin Hcl) .... Once Daily At 2pm 8)  Freestyle Test  Strp (Glucose Blood) .... Check Two Times A Day. 9)  Nexium 40 Mg Cpdr (Esomeprazole Magnesium) .... Two Times A Day 10)  Tricor 145 Mg Tabs (Fenofibrate) .Marland Kitchen.. 1 By Mouth Daily 11)  Bayer Low Strength 81 Mg Tbec (Aspirin) .... Take Once Daily 12)  Alprazolam 0.25 Mg Tabs (Alprazolam) .Marland Kitchen.. 1 By Mouth Three Times A Day As Needed 13)  Cheratussin Ac 100-10 Mg/7ml Syrp (Guaifenesin-Codeine) .Marland Kitchen.. 1-2 Tsp By Mouth At Bedtime As Needed 14)  Nasonex 50 Mcg/act Susp (Mometasone Furoate) .... 2 Sprays Each Nostril Once Daily  Allergies (verified): No Known Drug  Allergies  Past History:  Past Medical History: Last updated: 10/28/2009 1. Chest pain    --cath 10 years ago ok    --myoview 2007 and 05/2009: normal 2. Depression 3. Diabetes mellitus, type II 4. GERD 5. Hypertension 6. Hyperlipidemia 7. 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    Physical Exam  General:  obese.  no distress Pulses:  dorsalis pedis intact bilat.   Extremities:  no deformity.  no ulcer on the feet.  feet are of normal color and temp.  no edema  Neurologic:  sensation is intact to touch on the feet    Impression & Recommendations:  Problem # 1:  DIABETES MELLITUS, TYPE II (ICD-250.00) this is the best control this pt should aim for, given this sulfonylurea-containing regimen.  Medications Added to Medication List This Visit: 1)  Welchol 625 Mg Tabs (Colesevelam hcl) .... 6 tabs once daily  Other Orders: TLB-A1C / Hgb A1C (Glycohemoglobin) (83036-A1C) Est. Patient Level III ([address])  Patient Instructions: 1)  blood tests are being ordered for you today.  please call [phone] to hear your test results. 2)  pending the test results, please continue the same medications for now. 3)  it is possible, based on the result of the blood test, that i would advise you to see a diabetes education specialist to go over how to inject insulin.   4)  check your blood sugar 1 time a day.  vary the time of day when you check, between before the 3 meals, and at bedtime.  also check if you have symptoms of your blood sugar being too high or too low.  please keep a record of the readings and bring it to your next appointment here.  please call us sooner if you are having low blood sugar episodes. 5)  please continue to work with dr Laury Axon on your symptoms.   6)  (update: i left message on phone-tree:  same rx). Prescriptions: WELCHOL 625 MG TABS (COLESEVELAM HCL) 6 tabs once daily  #180 x 11   Entered and  Authorized by:   Minus Breeding MD   Signed by:   Minus Breeding MD on 05/04/2010   Method used:   Electronically to        [facility] (retail)       [address]       [place], [place]  [address]       Ph: [phone]       Fax: [phone]   RxID:   [id number] WELCHOL 625 MG TABS (COLESEVELAM HCL) 6 tabs once daily  #180 x 11   Entered and Authorized by:   Minus Breeding MD   Signed by:   Minus Breeding MD on 05/04/2010   Method used:   Electronically to        [facility] (retail)       [address]       [place], [place]  [address]       Ph: [phone]       Fax: [phone]   RxID:   [id number]    Orders Added: 1)  TLB-A1C / Hgb A1C (Glycohemoglobin) [83036-A1C] 2)  Est. Patient Level III [[address]]

## 2010-08-16 NOTE — Progress Notes (Signed)
Summary: [facility]----RX  Phone Note Refill Request   Refills Requested: Medication #1:  ONGLYZA 5 MG TABS TAKE 1 TABS [facility] [address]--[phone] FX--[phone]  Initial call taken by: Freddy Jaksch,  November 10, 2008 10:11 AM      Prescriptions: ONGLYZA 5 MG TABS (SAXAGLIPTIN HCL) TAKE 1 TABS  #30 x 5   Entered by:   Jeremy Johann CMA   Authorized by:   Loreen Freud DO   Signed by:   Jeremy Johann CMA on 11/10/2008   Method used:   Re-Faxed to ...       [facility] [id number] (retail)       [address] [place], [place]  [address]       Ph: [phone]       Fax: [phone]   RxID:   [id number]

## 2010-08-16 NOTE — Progress Notes (Signed)
Summary: Lab Results [facility] 11/30)   Phone Note Outgoing Call   Summary of Call: Regarding lab results, LMTCB:  cholesterol good!  Con't meds. DM still not controlled-----con't meds---refer endo.    repeat chol in 6 months.   272.4  lipid, hep Initial call taken by: Army Fossa CMA,  June 15, 2009 11:07 AM  Follow-up for Phone Call        pt aware.  Follow-up by: Army Fossa CMA,  June 15, 2009 1:22 PM

## 2010-08-16 NOTE — Medication Information (Signed)
Summary: Prior Authorization for Onglyza/[employer]  Prior Authorization for Onglyza/[employer]   Imported By: Lanelle Bal 12/07/2008 11:38:49  _____________________________________________________________________  External Attachment:    Type:   Image     Comment:   External Document

## 2010-08-16 NOTE — Assessment & Plan Note (Signed)
Summary: bad pain in her hands and shoulder//ca   Vital Signs:  Patient Profile:   55 Years Old Female Weight:      220 pounds Temp:     98.0 degrees F oral Pulse rate:   72 / minute BP sitting:   132 / 90  (right arm) Cuff size:   large  Vitals Entered By: Donna Pham (May 20, 2007 10:03 AM)                  PCP:  Donna Pham  Chief Complaint:  PAIN IN LEFT ARM AND SHOULDER (ONGOING CONCERN) and Type 2 diabetes mellitus follow-up.  History of Present Illness: Pt main reason for being here is because of L wrist and arm pain.  Dr. Mina Pham wants to do surgery but they are having trouble with insurance coverage.  Pt needs pain meds until that can be worked out  Hypertension Follow-Up      This is a 55 year old woman who presents for Hypertension follow-up.  The patient denies lightheadedness, urinary frequency, headaches, edema, impotence, rash, and fatigue.  The patient denies the following associated symptoms: chest pain, chest pressure, exercise intolerance, dyspnea, palpitations, syncope, leg edema, and pedal edema.  Compliance with medications (by patient report) has been near 100%.  The patient reports that dietary compliance has been good.  The patient reports no exercise.  Adjunctive measures currently used by the patient include salt restriction.    Type 2 Diabetes Mellitus Follow-Up      The patient is also here for Type 2 diabetes mellitus follow-up.  The patient denies polyuria, polydipsia, blurred vision, self managed hypoglycemia, hypoglycemia requiring help, weight loss, weight gain, and numbness of extremities.  The patient denies the following symptoms: neuropathic pain, chest pain, vomiting, orthostatic symptoms, poor wound healing, intermittent claudication, vision loss, and foot ulcer.  Since the last visit the patient reports poor dietary compliance, compliance with medications, not exercising regularly, and not monitoring blood glucose.  Since the last visit, the  patient reports having had eye care by an ophthalmologist.    Hyperlipidemia Follow-Up      The patient also presents for Hyperlipidemia follow-up.  The patient denies muscle aches, GI upset, abdominal pain, flushing, itching, constipation, diarrhea, and fatigue.  The patient denies the following symptoms: chest pain/pressure, exercise intolerance, dypsnea, palpitations, syncope, and pedal edema.  Compliance with medications (by patient report) has been near 100%.  Dietary compliance has been fair.  The patient reports no exercise.      Past Medical History:    Depression    Diabetes mellitus, type II    GERD    Hypertension    Hyperlipidemia     Review of Systems      See HPI   Physical Exam  General:     Well-developed,well-nourished,in no acute distress; alert,appropriate and cooperative throughout examination Lungs:     Normal respiratory effort, chest expands symmetrically. Lungs are clear to auscultation, no crackles or wheezes. Heart:     Normal rate and regular rhythm. S1 and S2 normal without gallop, murmur, click, rub or other extra sounds.  Diabetes Management Exam:    Foot Exam (with socks and/or shoes not present):       Sensory-Pinprick/Light touch:          Left medial foot (L-4): normal          Left dorsal foot (L-5): normal          Left lateral foot (S-1):  normal          Right medial foot (L-4): normal          Right dorsal foot (L-5): normal          Right lateral foot (S-1): normal       Sensory-Monofilament:          Left foot: normal          Right foot: normal       Inspection:          Left foot: normal          Right foot: normal       Nails:          Left foot: normal          Right foot: normal    Impression & Recommendations:  Problem # 1:  CARPAL TUNNEL SYNDROME, BILATERAL, HX OF (ICD-V12.49) Vicodin ES  rx given--- pt waiting for ins approval for surgery Orders: Venipuncture (81191) TLB-Lipid Panel (80061-LIPID) TLB-BMP (Basic  Metabolic Panel-BMET) (80048-METABOL) TLB-CBC Platelet - w/Differential (85025-CBCD) TLB-Hepatic/Liver Function Pnl (80076-HEPATIC)   Problem # 2:  HYPERTENSION (ICD-401.9)  Her updated medication list for this problem includes:    Lisinopril 10 Mg Tabs (Lisinopril) .Marland Kitchen... 1 once daily  Orders: Venipuncture (47829) TLB-Lipid Panel (80061-LIPID) TLB-BMP (Basic Metabolic Panel-BMET) (80048-METABOL) TLB-CBC Platelet - w/Differential (85025-CBCD) TLB-Hepatic/Liver Function Pnl (80076-HEPATIC)  BP today: 132/90 Prior BP: 118/82 (03/12/2007)  Labs Reviewed: Creat: 0.8 (01/22/2007) Chol: 196 (01/22/2007)   HDL: 37.3 (01/22/2007)   LDL: 120 (01/22/2007)   TG: 195 (01/22/2007)   Problem # 3:  HYPERLIPIDEMIA (ICD-272.4)  Her updated medication list for this problem includes:    Lipitor 80 Mg Tabs (Atorvastatin calcium) .Marland Kitchen... 1 at bedtime    Gemfibrozil 600 Mg Tabs (Gemfibrozil) .Marland Kitchen... 1 by mouth two times a day  Labs Reviewed: Chol: 196 (01/22/2007)   HDL: 37.3 (01/22/2007)   LDL: 120 (01/22/2007)   TG: 195 (01/22/2007) SGOT: 18 (01/22/2007)   SGPT: 24 (01/22/2007)   Problem # 4:  DIABETES MELLITUS, TYPE II (ICD-250.00)  Her updated medication list for this problem includes:    Glyburide Tabs (Glyburide tabs) .Marland KitchenMarland KitchenMarland KitchenMarland Kitchen 5 mg two times a day    Actos 45 Mg Tabs (Pioglitazone hcl) .Marland Kitchen... 1 once daily    Glucophage 500 Mg Tabs (Metformin hcl) .Marland Kitchen... 1 by mouth two times a day    Lisinopril 10 Mg Tabs (Lisinopril) .Marland Kitchen... 1 once daily    Glyburide 5 Mg Tabs (Glyburide) .Marland Kitchen... 2 by mouth two times a day  Orders: Venipuncture (56213) TLB-Lipid Panel (80061-LIPID) TLB-BMP (Basic Metabolic Panel-BMET) (80048-METABOL) TLB-CBC Platelet - w/Differential (85025-CBCD) TLB-Hepatic/Liver Function Pnl (80076-HEPATIC) TLB-A1C / Hgb A1C (Glycohemoglobin) (83036-A1C)  Labs Reviewed: HgBA1c: 8.5 (01/22/2007)   Creat: 0.8 (01/22/2007)      Complete Medication List: 1)  Robinul-forte 2 Mg Tabs  (Glycopyrrolate) .Marland Kitchen.. 1 tablet by mouth twice a day 2)  Ultram 50 Mg Tabs (Tramadol hcl) .... Take 1 tablet by mouth as directed 3)  Prevacid 15 Mg Cpdr (Lansoprazole) .Marland Kitchen.. 1 once daily 4)  Lyrica 50 Mg Caps (Pregabalin) .Marland Kitchen.. 1 once daily 5)  Glyburide Tabs (Glyburide tabs) .... 5 mg two times a day 6)  Cymbalta 60 Mg Cpep (Duloxetine hcl) .Marland Kitchen.. 1 once daily 7)  Actos 45 Mg Tabs (Pioglitazone hcl) .Marland Kitchen.. 1 once daily 8)  Glucophage 500 Mg Tabs (Metformin hcl) .Marland Kitchen.. 1 by mouth two times a day 9)  Lisinopril 10 Mg Tabs (Lisinopril) .Marland Kitchen.. 1 once daily  10)  Lipitor 80 Mg Tabs (Atorvastatin calcium) .Marland Kitchen.. 1 at bedtime 11)  Gemfibrozil 600 Mg Tabs (Gemfibrozil) .Marland Kitchen.. 1 by mouth two times a day 12)  Glyburide 5 Mg Tabs (Glyburide) .... 2 by mouth two times a day 13)  Vicodin Es 7.5-750 Mg Tabs (Hydrocodone-acetaminophen) .Marland Kitchen.. 1 by mouth every 6 hours as needed     Prescriptions: VICODIN ES 7.5-750 MG  TABS (HYDROCODONE-ACETAMINOPHEN) 1 by mouth every 6 hours as needed  #60 x 0   Entered and Authorized by:   Loreen Freud DO   Signed by:   Loreen Freud DO on 05/20/2007   Method used:   Print then Give to Patient   RxID:   [id number]  ]

## 2010-08-16 NOTE — Progress Notes (Signed)
Summary: decline med  Phone Note Outgoing Call Call back at [facility] Phone [phone]   Summary of Call: left message to call  office.Felecia Deloach CMA  February 17, 2009 3:10 PM  DM not controlled----- start Victoza .6 mg daily for 1 week then 1.2 mg daily for 1 week then 1.8 mg daily----pt will need ov to show her how to inject medication  cholesterol is still high----  con't meds  recheck 3 months----272.4  250.00  hep, lipid, bmp, hgba1c, microalbumin    Initial call taken by: Kandice Hams,  February 19, 2009 10:57 AM  Follow-up for Phone Call        pt returning call, left message to call  office.Felecia Deloach CMA  February 18, 2009 12:07 PM  Additional Follow-up for Phone Call Additional follow up Details #1::        pt states her HUSBAND HAS BEEN SICK AND SHE NOT BEEN TAKING MED AS RX PT STATES THAT SHE IS NOW TAKEING MEDS RIGHT. PT STATES THAT SHE WILL SCHEDULE A OV TO DISCUSS THIS MATTER WITH YOU...................Marland KitchenFelecia Deloach CMA  February 19, 2009 11:41 AM

## 2010-08-16 NOTE — Progress Notes (Signed)
Summary: --PRIOR AUTH APPROVED FOR ONGLYZA--[employer]  Phone Note Refill Request   Refills Requested: Medication #1:  ONGLYZA 5 MG TABS TAKE 1 TABS [facility] ON [address]--[phone] AUTH--[phone]  Initial call taken by: Freddy Jaksch,  Nov 19, 2008 2:43 PM  Follow-up for Phone Call        PRIOR AUTH IN PROCESS [employer]...................Marland KitchenFelecia Deloach CMA  Nov 20, 2008 10:13 AM  Additional Follow-up for Phone Call Additional follow up Details #1::        PRIOR AUTH denied benefit provides coverage for the requested drug when medically necessary. the information submitted does not meet [facility] medical necessity guidelines for coverage.the member must meet the following criteria:intolerance or inadequate response after an adherent trial COMBINATION ORAL therapy with preferred: metformin, sulfonylureas, and/or thiazolidinediones    Additional Follow-up for Phone Call Additional follow up Details #2::    SHe is either on one or could not tolerate them ----- She does qualify based on what they are saying.   Glucophage gave her uncontrollable diarrhea.    She is taking actos and glyburide. Their denial makes no sense. Follow-up by: Loreen Freud DO,  Nov 23, 2008 4:57 PM  Additional Follow-up for Phone Call Additional follow up Details #3:: Details for Additional Follow-up Action Taken: Prior auth faxed back readdressing the fact pt intolerant to glucophage and med she is currently taking. awaiting response. PRIOR AUTH APPROVED FOR ONGLYZAUNTIL 11/27/2010--[employer]  [facility] FAXED .Kandice Hams  Dec 01, 2008 8:09 AM  Additional Follow-up by: Kandice Hams,  Dec 01, 2008 8:10 AM

## 2010-08-16 NOTE — Progress Notes (Signed)
Summary: [employer] AUTH TO BE DENIED  Phone Note Other Incoming Call back at [phone] PRECERT DEPT   Caller: [facility] DEPARTMENT Summary of Call: FYI....Marland KitchenMarland KitchenIN REFERENCE TO THE LETTER'S OF MEDICAL NECESSITY THAT I MAILED ON 11-11-08 TO [employer] FOR Donna Pham, AND HER SPOUSE, BOTH PT'S OF DR. Laury Axon, I RECIEVED A CALL FROM PHOUNG IN THE PRECERT/AUTHORIZATION DEPT AT [employer].  PER PHOUNG, THE ONLY LETTER FOR REQUEST OF A CLOSER Donna Pham SHE SHOWS THEY HAVE REC'D IS FOR Donna Pham ONLY.  PHOUNG STARTS OFF THE CALL BY SAYING SHE IS UNCLEAR WHAT WE WANT (ALTHOUGH THE LETTERS WE MAILED CLEARLY STATE).  I EXPLAINED TO HER THE CONDITION OF PT'S SPOUSE Donna Pham, AND THAT WE WANT AUTHORIZATION FOR BOTH PT'S TO SEE PROVIDERS CLOSER TO THEIR HOME.  PHOUNG THEN TELLS ME THAT I WAS TOLD WRONG BY [employer] CSR-AUDREY UJW#[id number], TO MAIL WHAT I DID & WHERE I MAILED IT TO.  PHOUNG OVERTALKED ME, WAS RUDE, REPEATED THAT I SHOULDN'T HAVE MAILED AS I WAS TOLD, SAYS I SHOULD HAVE TRANSFERRED TO [employer]'S PRE CERT DEPT.  I INFORMED PHOUNG THAT AS I WAS INSTRUCTED I MAILED BOTH THIS PATIENTS & HER SPOUSE'S REQUEST IN THE SAME LARGE LEGAL BROWN ENVELOPE, CLEARLY AS "2" SEPERATE PACKETS, AND PHOUNG JUST OVERTALKS ME & REPEATS THAT THEY ONLY HAVE Donna Pham'S & THAT I SHOULD HAVE MAILED THEM SEPERATE.  PHOUNG STATES THE CLOSEST [facility] FOR Donna Pham IS IN [place] AND "IS ONLY 8 MILES FROM THE PATIENT'S HOME", AND UNLESS THE DISTANCE IS 30 MILES OR MORE THEY DO NOT MAKE EXCEPTIONS.....EVEN AFTER THEY HAVE BEEN SENT LETTERS OF MEDICAL NECESSITY FROM 2 DIFFERENT PHYSICIANS!  PHOUNG HAD NO RESPECT, EMPATHY, OR ANY TYPE OF CONSIDERATION FOR THE PATIENT'S SITUATION, OR TOWARDS ME DURING THE CALL.  I FINALLY JUST ASKED THE CALLER (PHOUNG) FOR HER NAME AND TOLD HER I WOULD NEED TO FORWARD THIS INFO TO DR. Laury Axon.  PER PHOUNG, SHE PUT THIS REQUEST FOR Donna Pham "ON HOLD", BUT DID ASSURE ME THAT IT IS EXTREMELY LIKELY OUR REQUEST  WILL NOT BE APPROVED.  EVERYTIME I HAVE ANY DEALING WITH [employer], IT IS ALWAYS DIFFICULT, I'M GIVEN INCORRECT INFORMATION, ETC.... Initial call taken by: Magdalen Spatz [facility],  December 21, 2008 10:02 AM

## 2010-08-16 NOTE — Assessment & Plan Note (Signed)
Summary: vaginal itch/cbs   Vital Signs:  Patient profile:   55 year old female Weight:      220.4 pounds Temp:     97.8 degrees F oral BP sitting:   122 / 84  (right arm) Cuff size:   large  Vitals Entered By: Almeta Monas CMA Duncan Dull) (May 19, 2010 3:17 PM) CC: c/o vaginal itch since starting antibiotics   History of Present Illness: pt did not need to come in --no charge  Current Medications (verified): 1)  Actos 45 Mg  Tabs (Pioglitazone Hcl) .Marland Kitchen.. 1 Once Daily 2)  Lipitor 80 Mg  Tabs (Atorvastatin Calcium) .Marland Kitchen.. 1 At Bedtime 3)  Diovan 160 Mg  Tabs (Valsartan) .Marland Kitchen.. 1 Once Daily 4)  Onglyza 5 Mg Tabs (Saxagliptin Hcl) .... Take 1 Tabs 5)  Zoloft 100 Mg Tabs (Sertraline Hcl) .... 2 By Mouth Once Daily 6)  Glyburide 2.5 Mg Tabs (Glyburide) .Marland Kitchen.. 1 Bid 7)  Metformin Hcl 500 Mg Xr24h-Tab (Metformin Hcl) .... Once Daily At 2pm 8)  Freestyle Test  Strp (Glucose Blood) .... Check Two Times A Day. 9)  Nexium 40 Mg Cpdr (Esomeprazole Magnesium) .... Two Times A Day 10)  Tricor 145 Mg Tabs (Fenofibrate) .Marland Kitchen.. 1 By Mouth Daily 11)  Bayer Low Strength 81 Mg Tbec (Aspirin) .... Take Once Daily 12)  Alprazolam 0.25 Mg Tabs (Alprazolam) .Marland Kitchen.. 1 By Mouth Three Times A Day As Needed 13)  Cheratussin Ac 100-10 Mg/39ml Syrp (Guaifenesin-Codeine) .Marland Kitchen.. 1-2 Tsp By Mouth At Bedtime As Needed 14)  Nasonex 50 Mcg/act Susp (Mometasone Furoate) .... 2 Sprays Each Nostril Once Daily 15)  Ceftin 500 Mg Tabs (Cefuroxime Axetil) .Marland Kitchen.. 1 By Mouth Two Times A Day  Allergies (verified): No Known Drug Allergies   Complete Medication List: 1)  Actos 45 Mg Tabs (Pioglitazone hcl) .Marland Kitchen.. 1 once daily 2)  Lipitor 80 Mg Tabs (Atorvastatin calcium) .Marland Kitchen.. 1 at bedtime 3)  Diovan 160 Mg Tabs (Valsartan) .Marland Kitchen.. 1 once daily 4)  Onglyza 5 Mg Tabs (Saxagliptin hcl) .... Take 1 tabs 5)  Zoloft 100 Mg Tabs (Sertraline hcl) .... 2 by mouth once daily 6)  Glyburide 2.5 Mg Tabs (Glyburide) .Marland Kitchen.. 1 bid 7)  Metformin Hcl 500  Mg Xr24h-tab (Metformin hcl) .... Once daily at 2pm 8)  Freestyle Test Strp (Glucose blood) .... Check two times a day. 9)  Nexium 40 Mg Cpdr (Esomeprazole magnesium) .... Two times a day 10)  Tricor 145 Mg Tabs (Fenofibrate) .Marland Kitchen.. 1 by mouth daily 11)  Bayer Low Strength 81 Mg Tbec (Aspirin) .... Take once daily 12)  Alprazolam 0.25 Mg Tabs (Alprazolam) .Marland Kitchen.. 1 by mouth three times a day as needed 13)  Cheratussin Ac 100-10 Mg/73ml Syrp (Guaifenesin-codeine) .Marland Kitchen.. 1-2 tsp by mouth at bedtime as needed 14)  Nasonex 50 Mcg/act Susp (Mometasone furoate) .... 2 sprays each nostril once daily 15)  Ceftin 500 Mg Tabs (Cefuroxime axetil) .Marland Kitchen.. 1 by mouth two times a day 16)  Fluconazole 150 Mg Tabs (Fluconazole) .Marland Kitchen.. 1 by mouth once daily x1, may repeat in 3 days as needed  Other Orders: No Charge Patient Arrived (NCPA0) (NCPA0) Prescriptions: FLUCONAZOLE 150 MG TABS (FLUCONAZOLE) 1 by mouth once daily x1, may repeat in 3 days as needed  #2 x 2   Entered and Authorized by:   Loreen Freud DO   Signed by:   Loreen Freud DO on 05/19/2010   Method used:   Electronically to        [facility]  [address] [phone]* (retail)       [address] [place], [place]  [address]       Ph: [phone]       Fax: [phone]   RxID:   [id number]    Orders Added: 1)  No Charge Patient Arrived (NCPA0) [NCPA0]

## 2010-08-16 NOTE — Progress Notes (Signed)
Summary: RX  Phone Note Refill Request Message from:  Patient  Refills Requested: Medication #1:  ACTOS 45 MG  TABS 1 once daily  Medication #2:  ONGLYZA 5 MG TABS TAKE 1 TABS PT USES [facility] [address]  Initial call taken by: Kandice Hams,  November 09, 2008 11:11 AM      Prescriptions: ONGLYZA 5 MG TABS (SAXAGLIPTIN HCL) TAKE 1 TABS  #30 x 5   Entered by:   Kandice Hams   Authorized by:   Loreen Freud DO   Signed by:   Kandice Hams on 11/09/2008   Method used:   Faxed to ...       [facility] [address] #[id number]* (retail)       [address] [place], [place]  [address]       Ph: [phone]       Fax: [phone]   RxID:   [id number] ACTOS 45 MG  TABS (PIOGLITAZONE HCL) 1 once daily  #30 x 5   Entered by:   Kandice Hams   Authorized by:   Loreen Freud DO   Signed by:   Kandice Hams on 11/09/2008   Method used:   Faxed to ...       [facility] [address] #[id number]* (retail)       [address] [place], [place]  [address]       Ph: [phone]       Fax: [phone]   RxID:   [id number]

## 2010-08-16 NOTE — Assessment & Plan Note (Signed)
Summary: KIDNEY PROBLEM/KN   Vital Signs:  Patient profile:   55 year old female Height:      66 inches Weight:      219.2 pounds Temp:     98.3 degrees F oral Pulse rate:   68 / minute Pulse rhythm:   regular BP sitting:   120 / 84  (left arm)  Vitals Entered By: Almeta Monas CMA Duncan Dull) (March 24, 2010 11:13 AM) CC: c/o back pain, Dysuria   CC:  c/o back pain and Dysuria.  History of Present Illness:  Dysuria      This is a 55 year old woman who presents with Dysuria.  The patient reports burning with urination and urinary frequency, but denies urgency, hematuria, vaginal discharge, vaginal itching, and vaginal sores.  Associated symptoms include flank pain and back pain.  The patient denies the following associated symptoms: nausea, vomiting, fever, shaking chills, abdominal pain, pelvic pain, and arthralgias.  Risk factors for urinary tract infection include diabetes.  The patient denies the following risk factors: prior antibiotics, immunosuppression, history of GU anomaly, history of pyelonephritis, pregnancy, history of STD, and analgesic abuse.    Current Medications (verified): 1)  Actos 45 Mg  Tabs (Pioglitazone Hcl) .Marland Kitchen.. 1 Once Daily 2)  Lipitor 80 Mg  Tabs (Atorvastatin Calcium) .Marland Kitchen.. 1 At Bedtime 3)  Diovan 160 Mg  Tabs (Valsartan) .Marland Kitchen.. 1 Once Daily 4)  Onglyza 5 Mg Tabs (Saxagliptin Hcl) .... Take 1 Tabs 5)  Zoloft 100 Mg Tabs (Sertraline Hcl) .... 2 By Mouth Once Daily 6)  Glyburide 2.5 Mg Tabs (Glyburide) .Marland Kitchen.. 1 Bid 7)  Metformin Hcl 500 Mg Xr24h-Tab (Metformin Hcl) .... Once Daily At 2pm 8)  Freestyle Test  Strp (Glucose Blood) .... Check Two Times A Day. 9)  Nexium 40 Mg Cpdr (Esomeprazole Magnesium) .... Two Times A Day 10)  Tricor 145 Mg Tabs (Fenofibrate) .Marland Kitchen.. 1 By Mouth Daily 11)  Bayer Low Strength 81 Mg Tbec (Aspirin) .... Take Once Daily 12)  Alprazolam 0.25 Mg Tabs (Alprazolam) .Marland Kitchen.. 1 By Mouth Three Times A Day As Needed 13)  Cipro 500 Mg Tabs  (Ciprofloxacin Hcl) .Marland Kitchen.. 1 By Mouth Two Times A Day  Allergies (verified): No Known Drug Allergies  Past History:  Past medical, surgical, family and social histories (including risk factors) reviewed for relevance to current acute and chronic problems.  Past Medical History: Reviewed history from 10/28/2009 and no changes required. 1. Chest pain    --cath 10 years ago ok    --myoview 2007 and 05/2009: normal 2. Depression 3. Diabetes mellitus, type II 4. GERD 5. Hypertension 6. Hyperlipidemia 7. 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 G6 P 2  Family History: Reviewed history from 07/05/2009 and no changes required. Father:arthritis, ? CA  Mother: DM (deceased) Siblings:none   Social History: Reviewed history from 08/10/2009 and no changes required. no diet Retired Married Drug use-no Regular exercise-no Never Smoked Alcohol use-no [place]  Physical Exam  General:  Well-developed,well-nourished,in no acute distress; alert,appropriate and cooperative throughout examination Abdomen:  Bowel sounds positive,abdomen soft and non-tender without masses, organomegaly or hernias  no flank pain with palpation  Psych:  Cognition and judgment appear intact. Alert and cooperative with normal attention span and concentration. No apparent delusions, illusions, hallucinations   Impression & Recommendations:  Problem # 1:  BACK PAIN (ICD-724.5)  Her updated medication list for this problem  includes:    Bayer Low Strength 81 Mg Tbec (Aspirin) .Marland Kitchen... Take once daily  Orders: Venipuncture (01027) TLB-BMP (Basic Metabolic Panel-BMET) (80048-METABOL) T-Culture, Urine (25366-44034) UA Dipstick w/o Micro (manual) (74259)  Discussed use of moist heat or ice, modified activities, medications, and stretching/strengthening exercises. Back care instructions given. To be  seen in 2 weeks if no improvement; sooner if worsening of symptoms.   Problem # 2:  UTI (ICD-599.0)  Her updated medication list for this problem includes:    Cipro 500 Mg Tabs (Ciprofloxacin hcl) .Marland Kitchen... 1 by mouth two times a day  Encouraged to push clear liquids, get enough rest, and take acetaminophen as needed. To be seen in 10 days if no improvement, sooner if worse.  Complete Medication List: 1)  Actos 45 Mg Tabs (Pioglitazone hcl) .Marland Kitchen.. 1 once daily 2)  Lipitor 80 Mg Tabs (Atorvastatin calcium) .Marland Kitchen.. 1 at bedtime 3)  Diovan 160 Mg Tabs (Valsartan) .Marland Kitchen.. 1 once daily 4)  Onglyza 5 Mg Tabs (Saxagliptin hcl) .... Take 1 tabs 5)  Zoloft 100 Mg Tabs (Sertraline hcl) .... 2 by mouth once daily 6)  Glyburide 2.5 Mg Tabs (Glyburide) .Marland Kitchen.. 1 bid 7)  Metformin Hcl 500 Mg Xr24h-tab (Metformin hcl) .... Once daily at 2pm 8)  Freestyle Test Strp (Glucose blood) .... Check two times a day. 9)  Nexium 40 Mg Cpdr (Esomeprazole magnesium) .... Two times a day 10)  Tricor 145 Mg Tabs (Fenofibrate) .Marland Kitchen.. 1 by mouth daily 11)  Bayer Low Strength 81 Mg Tbec (Aspirin) .... Take once daily 12)  Alprazolam 0.25 Mg Tabs (Alprazolam) .Marland Kitchen.. 1 by mouth three times a day as needed 13)  Cipro 500 Mg Tabs (Ciprofloxacin hcl) .Marland Kitchen.. 1 by mouth two times a day  Patient Instructions: 1)  Drink plenty of fluids up to 3-4 quarts a day. Cranberry juice is especially recommended in addition to large amounts of water. Avoid caffeine & carbonated drinks, they tend to irritate the bladder, Return in 3-5 days if you're not better: sooner if you're feeling worse.  Prescriptions: CIPRO 500 MG TABS (CIPROFLOXACIN HCL) 1 by mouth two times a day  #10 x 0   Entered and Authorized by:   Loreen Freud DO   Signed by:   Loreen Freud DO on 03/24/2010   Method used:   Electronically to        [facility] [id number]* (retail)       [address] [place], [place]  [address]       Ph: [phone]       Fax: [phone]    RxID:   [id number]   Laboratory Results   Urine Tests    Routine Urinalysis   Color: yellow Appearance: Clear Glucose: negative   (Normal Range: Negative) Bilirubin: negative   (Normal Range: Negative) Ketone: negative   (Normal Range: Negative) Spec. Gravity: 1.025   (Normal Range: 1.003-1.035) Blood: negative   (Normal Range: Negative) pH: 5.0   (Normal Range: 5.0-8.0) Protein: negative   (Normal Range: Negative) Urobilinogen: 0.2   (Normal Range: 0-1) Nitrite: negative   (Normal Range: Negative) Leukocyte Esterace: negative   (Normal Range: Negative)

## 2010-08-16 NOTE — Letter (Signed)
Summary: Results Follow-up Letter  [facility]  [address] [place], [place] [address]   Phone: [phone]  Fax: [phone]    02/09/2009        Donna Pham [address] [place], [place]  [address]  Dear [name],   The following are the results of your recent test(s):  Test     Result     Pap Smear    Normal_______  Not Normal_____       Comments: _________________________________________________________ Cholesterol LDL(Bad cholesterol):          Your goal is less than:         HDL (Good cholesterol):        Your goal is more than: _________________________________________________________ Other Tests:   _________________________________________________________  Please call for an appointment Or __Please see attached labwork._______________________________________________________ _________________________________________________________ _________________________________________________________  Sincerely,  [name] at [facility]

## 2010-08-16 NOTE — Assessment & Plan Note (Signed)
Summary: FU PER FLAG/NWS   Vital Signs:  Patient profile:   55 year old female Height:      66 inches (167.64 cm) Weight:      227.50 pounds (103.41 kg) O2 Sat:      96 % on Room air Temp:     97.3 degrees F (36.28 degrees C) oral Pulse rate:   68 / minute BP sitting:   122 / 82  (left arm) Cuff size:   large  Vitals Entered By: Josph Macho CMA (August 10, 2009 2:32 PM)  O2 Flow:  Room air CC: Follow-up visit/ CF Is Patient Diabetic? Yes   Referring Provider:  Loreen Freud DO Primary Provider:  Laury Axon  CC:  Follow-up visit/ CF.  History of Present Illness: no cbg record, but states cbg's vary from 160-220.  she feels well except for anxiety.   she feels some of her gi sxs may be heartburn.  Current Medications (verified): 1)  Glyburide 5 Mg  Tabs (Glyburide) .Marland Kitchen.. 1 Tab By Mouth Two Times A Day 2)  Actos 45 Mg  Tabs (Pioglitazone Hcl) .Marland Kitchen.. 1 Once Daily 3)  Lipitor 80 Mg  Tabs (Atorvastatin Calcium) .Marland Kitchen.. 1 At Bedtime 4)  Diovan 160 Mg  Tabs (Valsartan) .Marland Kitchen.. 1 Once Daily 5)  Onglyza 5 Mg Tabs (Saxagliptin Hcl) .... Take 1 Tabs 6)  Zoloft 100 Mg Tabs (Sertraline Hcl) .... 2 By Mouth Once Daily 7)  Triamcinolone Acetonide 0.1 % Oint (Triamcinolone Acetonide) .... Apply To Affected Area Two Times A Day.  Disp 1 Large Tube. 8)  Fluconazole 150 Mg Tabs (Fluconazole) .Marland Kitchen.. 1 By Mouth X1 , May Repeat in 1 Week As Needed 9)  Lotrimin Af 1 % Crea (Clotrimazole) .... As Directed 10)  Metformin Hcl 500 Mg Xr24h-Tab (Metformin Hcl) .Marland Kitchen.. 1 Qam  Allergies (verified): No Known Drug Allergies  Past History:  Past Medical History: Last updated: 01/10/2008 Depression Diabetes mellitus, type II GERD Hypertension Hyperlipidemia CTS ; 6/15-19/09 RLL  PNA with sepsis,high LFTs(ALT 61)  Social History: no diet Retired Married Drug use-no Regular exercise-no Never Smoked Alcohol use-no [place]  Review of Systems  The patient denies hypoglycemia.     Physical Exam  General:  obese.  no distress  Psych:  Alert and cooperative; normal mood and affect; normal attention span and concentration.   Additional Exam:  i reiewed hida scan with pt   Impression & Recommendations:  Problem # 1:  DIABETES MELLITUS, TYPE II (ICD-250.00) she is at risk for hypoglycemia  Problem # 2:  GERD (ICD-530.81)  Problem # 3:  non-functioning gb  Medications Added to Medication List This Visit: 1)  Glyburide 2.5 Mg Tabs (Glyburide) .Marland Kitchen.. 1 bid 2)  Metformin Hcl 500 Mg Xr24h-tab (Metformin hcl) .... 2 tabs bid  Other Orders: Est. Patient Level III ([address])  Patient Instructions: 1)  reduce glyburide to 2.5 mg two times a day 2)  increase metformin to 2x500 mg two times a day 3)  return 6 weeks 4)  check your blood sugar 1 time a day.  vary the time of day when you check, between before the 3 meals, and at bedtime.  also check if you have symptoms of your blood sugar being too high or too low.  please keep a record of the readings and bring it to your next appointment here.  please call us sooner if you are having low blood sugar episodes. 5)  same actos and onglyza (here are some samples of  januvia 100 mg, to take instead of onglyza 5 mg) 6)  trial of nexium 40 mg once daily Prescriptions: METFORMIN HCL 500 MG XR24H-TAB (METFORMIN HCL) 2 tabs bid  #120 x 11   Entered and Authorized by:   Minus Breeding MD   Signed by:   Minus Breeding MD on 08/10/2009   Method used:   Electronically to        [facility] (retail)       [address]       [place], [place]  [address]       Ph: [phone]       Fax: [phone]   RxID:   [id number] GLYBURIDE 2.5 MG TABS (GLYBURIDE) 1 bid  #60 x 11   Entered and Authorized by:   Minus Breeding MD   Signed by:   Minus Breeding MD on 08/10/2009   Method used:   Electronically to        [facility] (retail)       [address]       [place], [place]  [address]       Ph: [phone]       Fax: [phone]   RxID:   [id number]

## 2010-08-16 NOTE — Assessment & Plan Note (Signed)
Summary: acute only:arms legs stomach has rash//alj   Vital Signs:  Patient Profile:   55 Years Old Female Weight:      227 pounds Pulse rate:   70 / minute BP sitting:   128 / 84  (left arm)  Vitals Entered By: Doristine Devoid (August 08, 2007 2:42 PM)                 PCP:  Laury Axon  Chief Complaint:  RASH ON STOMACH AND LEGS HAS RESOLVED SOME AFTER USING CLARITIN and Rash.  History of Present Illness:  Rash      This is a 55 year old woman who presents with Rash.  The symptoms began duration > 3 days ago.  Pt here c/o rash that started after new Year---better now with claritin.  The patient reports papules, but denies macules, nodules, hives, welts, pustules, blisters, ulcers, itching, scaling, weeping, oozing, redness, increased warmth, and tenderness.  The rash is located on the abdomen, groin, right thigh, and left thigh.  The rash is worse with heat and better with OTC creams.  The patient denies the following symptoms: fever, headache, facial swelling, tongue swelling, burning, difficulty breathing, abdominal pain, nausea, vomiting, diarrhea, dizziness, sore throat, dysuria, eye symptoms, arthralgias, and vaginal discharge.  The patient reports a history of new topical exposure.  Pt using new body wash  and detergent .        Review of Systems      See HPI   Physical Exam  General:     Well-developed,well-nourished,in no acute distress; alert,appropriate and cooperative throughout examination Skin:     most of rash has resolved--  some papules still evident on abd and thighs Cervical Nodes:     No lymphadenopathy noted    Impression & Recommendations:  Problem # 1:  CONTACT DERMATITIS&OTH ECZEMA DUE OTH SPEC AGENT (ICD-692.89) xyzal once daily as needed  her updated medication list for this problem includes:    Triamcinolone Acetonide 0.1 % Crea (Triamcinolone acetonide) .Marland Kitchen... Apply two times a day rto if no betterDiscussed avoidance of triggers and symptomatic  treatment.  Her updated medication list for this problem includes:    Triamcinolone Acetonide 0.1 % Crea (Triamcinolone acetonide) .Marland Kitchen... Apply two times a day   Complete Medication List: 1)  Robinul-forte 2 Mg Tabs (Glycopyrrolate) .Marland Kitchen.. 1 tablet by mouth twice a day 2)  Ultram 50 Mg Tabs (Tramadol hcl) .... Take 1 tablet by mouth as directed 3)  Prevacid 15 Mg Cpdr (Lansoprazole) .Marland Kitchen.. 1 once daily 4)  Lyrica 50 Mg Caps (Pregabalin) .Marland Kitchen.. 1 once daily 5)  Glyburide 5 Mg Tabs (Glyburide) .... Take one tablet twice daily 6)  Cymbalta 60 Mg Cpep (Duloxetine hcl) .Marland Kitchen.. 1 once daily 7)  Actos 45 Mg Tabs (Pioglitazone hcl) .Marland Kitchen.. 1 once daily 8)  Glucophage 1000 Mg Tabs (Metformin hcl) .Marland Kitchen.. 1 by mouth two times a day 9)  Lisinopril 10 Mg Tabs (Lisinopril) .Marland Kitchen.. 1 once daily 10)  Lipitor 80 Mg Tabs (Atorvastatin calcium) .Marland Kitchen.. 1 at bedtime 11)  Gemfibrozil 600 Mg Tabs (Gemfibrozil) .Marland Kitchen.. 1 by mouth two times a day 12)  Glyburide 5 Mg Tabs (Glyburide) .... 2 by mouth two times a day 13)  Vicodin Es 7.5-750 Mg Tabs (Hydrocodone-acetaminophen) .Marland Kitchen.. 1 by mouth every 6 hours as needed 14)  Triamcinolone Acetonide 0.1 % Crea (Triamcinolone acetonide) .... Apply two times a day     Prescriptions: TRIAMCINOLONE ACETONIDE 0.1 %  CREA (TRIAMCINOLONE ACETONIDE) apply two times a day  #  30g x 0   Entered and Authorized by:   Loreen Freud DO   Signed by:   Loreen Freud DO on 08/08/2007   Method used:   Electronically sent to ...       [id number]       [address]       [place], [place]  [address]       Ph: [phone]       Fax: [phone]   RxID:   [id number] GLUCOPHAGE 1000 MG  TABS (METFORMIN HCL) 1 by mouth two times a day  #60 x 2   Entered and Authorized by:   Loreen Freud DO   Signed by:   Loreen Freud DO on 08/08/2007   Method used:   Electronically sent to ...       [id number]*       [address]       [place], [place]  [address]       Ph:  [phone]       Fax: [phone]   RxID:   [id number] GLYBURIDE 5 MG  TABS (GLYBURIDE) TAKE ONE TABLET TWICE DAILY  #60 x 3   Entered and Authorized by:   Loreen Freud DO   Signed by:   Loreen Freud DO on 08/08/2007   Method used:   Electronically sent to ...       [id number]       [address]       [place], [place]  [address]       Ph: [phone]       Fax: [phone]   RxID:   [id number]  ]

## 2010-08-16 NOTE — Medication Information (Signed)
Summary: [facility]  [facility]   Imported By: Sherian Rein 08/16/2009 10:25:34  _____________________________________________________________________  External Attachment:    Type:   Image     Comment:   External Document

## 2010-08-16 NOTE — Assessment & Plan Note (Signed)
Summary: ROA//CA  Medications Added ROBINUL-FORTE 2 MG TABS (GLYCOPYRROLATE) 1 tablet by mouth twice a day ULTRAM 50 MG TABS (TRAMADOL HCL) Take 1 tablet by mouth as directed PREVACID 15 MG  CPDR (LANSOPRAZOLE) 1 once daily LYRICA 50 MG  CAPS (PREGABALIN) 1 once daily GLYBURIDE   TABS (GLYBURIDE TABS) 5 MG two times a day CYMBALTA 60 MG  CPEP (DULOXETINE HCL) 1 once daily ACTOS 45 MG  TABS (PIOGLITAZONE HCL) 1 once daily GLUCOPHAGE 500 MG  TABS (METFORMIN HCL) 1 by mouth two times a day LISINOPRIL 10 MG  TABS (LISINOPRIL) 1 once daily LIPITOR 80 MG  TABS (ATORVASTATIN CALCIUM) 1 at bedtime        Vital Signs:  Patient Profile:   55 Years Old Female Weight:      221.2 pounds Pulse rate:   76 / minute BP sitting:   118 / 70  (left arm) Cuff size:   regular  Vitals Entered By: [name] (January 22, 2007 9:54 AM)               PCP:  Laury Axon  Chief Complaint:  1.)FOLLOW-UP (FASTING LABS) 2.) ONGOING PAIN IN NECK AND ARMS and Type 2 diabetes mellitus follow-up.  History of Present Illness: Pt here for f/u HTN, DM .Marland Kitchen  Pt c/o of arm and shoulder pains B/L with stress.   Type 2 Diabetes Mellitus Follow-Up      This is a 55 year old woman who presents for Type 2 diabetes mellitus follow-up.  The patient denies polyuria, polydipsia, blurred vision, self managed hypoglycemia, hypoglycemia requiring help, weight loss, weight gain, and numbness of extremities.  The patient denies the following symptoms: neuropathic pain, chest pain, vomiting, orthostatic symptoms, poor wound healing, intermittent claudication, vision loss, and foot ulcer.  Since the last visit the patient reports poor dietary compliance, noncompliance with medications, not exercising regularly, and monitoring blood glucose.  The patient has been measuring capillary blood glucose after breakfast and after lunch.  Since the last visit, the patient reports having had no eye care and no foot care.  Complications from diabetes  include salt restriction.    Hypertension Follow-Up      The patient also presents for Hypertension follow-up.  The patient denies lightheadedness, urinary frequency, headaches, edema, impotence, rash, and fatigue.  The patient denies the following associated symptoms: chest pain, chest pressure, exercise intolerance, dyspnea, palpitations, syncope, leg edema, and pedal edema.  Compliance with medications (by patient report) has been sporadic.  The patient reports that dietary compliance has been fair.  The patient reports no exercise.        Past Medical History:    Depression    Diabetes mellitus, type II    GERD    Hypertension  Past Surgical History:    Carpal tunnel release     Review of Systems      See HPI   Physical Exam  General:     Well-developed,well-nourished,in no acute distress; alert,appropriate and cooperative throughout examination Head:     Normocephalic and atraumatic without obvious abnormalities. No apparent alopecia or balding. Lungs:     Normal respiratory effort, chest expands symmetrically. Lungs are clear to auscultation, no crackles or wheezes. Heart:     normal rate, regular rhythm, and no murmur.   Msk:     normal ROM.  + muscle spasms across shoulers--Pt states no pain with palp only with movement. Extremities:     No clubbing, cyanosis, edema, or deformity noted with  normal full range of motion of all joints.   Skin:     Intact without suspicious lesions or rashes  Diabetes Management Exam:    Foot Exam (with socks and/or shoes not present):       Sensory-Pinprick/Light touch:          Left medial foot (L-4): normal          Left dorsal foot (L-5): normal          Left lateral foot (S-1): normal       Sensory-Monofilament:          Left foot: normal       Inspection:          Left foot: normal       Nails:          Left foot: normal    Impression & Recommendations:  Problem # 1:  DIABETES MELLITUS, TYPE II (ICD-250.00) check  labs--  Had extended discussion with pt about health and importance of meds and f/u appt.---  Pt and husband refusing referrals secondary to finances---  # for [facility] given to pt.--  otherwise f/u 3 months Her updated medication list for this problem includes:    Glyburide Tabs (Glyburide tabs) .Marland KitchenMarland KitchenMarland KitchenMarland Kitchen 5 mg two times a day    Actos 45 Mg Tabs (Pioglitazone hcl) .Marland Kitchen... 1 once daily    Glucophage 500 Mg Tabs (Metformin hcl) .Marland Kitchen... 1 by mouth two times a day    Lisinopril 10 Mg Tabs (Lisinopril) .Marland Kitchen... 1 once daily  Orders: TLB-Hepatic/Liver Function Pnl (80076-HEPATIC) TLB-TSH (Thyroid Stimulating Hormone) (84443-TSH) TLB-A1C / Hgb A1C (Glycohemoglobin) (83036-A1C) TLB-Microalbumin/Creat Ratio, Urine (82043-MALB)   Problem # 2:  HYPERTENSION (ICD-401.9) See above Her updated medication list for this problem includes:    Lisinopril 10 Mg Tabs (Lisinopril) .Marland Kitchen... 1 once daily  Orders: TLB-Hepatic/Liver Function Pnl (80076-HEPATIC) TLB-TSH (Thyroid Stimulating Hormone) (84443-TSH) TLB-A1C / Hgb A1C (Glycohemoglobin) (83036-A1C) TLB-Microalbumin/Creat Ratio, Urine (82043-MALB)   Problem # 3:  DEPRESSION (ICD-311) Pt refusing to go back to Psych Her updated medication list for this problem includes:    Cymbalta 60 Mg Cpep (Duloxetine hcl) .Marland Kitchen... 1 once daily   Problem # 4:  SHOULDER STRAIN (ICD-840.9) Assessment: New amrix sample given to try warm compresses pt refusing referrals  Medications Added to Medication List This Visit: 1)  Robinul-forte 2 Mg Tabs (Glycopyrrolate) .Marland Kitchen.. 1 tablet by mouth twice a day 2)  Ultram 50 Mg Tabs (Tramadol hcl) .... Take 1 tablet by mouth as directed 3)  Prevacid 15 Mg Cpdr (Lansoprazole) .Marland Kitchen.. 1 once daily 4)  Lyrica 50 Mg Caps (Pregabalin) .Marland Kitchen.. 1 once daily 5)  Glyburide Tabs (Glyburide tabs) .... 5 mg two times a day 6)  Cymbalta 60 Mg Cpep (Duloxetine hcl) .Marland Kitchen.. 1 once daily 7)  Actos 45 Mg Tabs (Pioglitazone hcl) .Marland Kitchen.. 1 once daily 8)   Glucophage 500 Mg Tabs (Metformin hcl) .Marland Kitchen.. 1 by mouth two times a day 9)  Lisinopril 10 Mg Tabs (Lisinopril) .Marland Kitchen.. 1 once daily 10)  Lipitor 80 Mg Tabs (Atorvastatin calcium) .Marland Kitchen.. 1 at bedtime  Other Orders: TLB-Lipid Panel (80061-LIPID) TLB-BMP (Basic Metabolic Panel-BMET) (80048-METABOL)          mn

## 2010-08-16 NOTE — Miscellaneous (Signed)
Summary: Orders Update   Clinical Lists Changes  Orders: Added new Referral order of Podiatry Referral (Podiatry) - Signed

## 2010-08-16 NOTE — Letter (Signed)
Summary: Primary Care Appointment Letter  [facility]  [address] [place], [place] [address]   Phone: [phone]  Fax: [phone]    03/20/2008 MRN: [id number]  [facility] [address] [place], [place]  [address]  Dear Ms. Fabrizio,   Your Primary Care Physician  has indicated that:    _______Your appointment with Dr Laury Axon has been rescheduled from            06-12-08 to 07-02-08 @9 :00 AM.   _______you missed your appointment on______ and need to call and          reschedule.    _______you need to have lab work done.    _______you need to schedule an appointment discuss lab or test results.    _______you need to call to reschedule your appointment that is                       scheduled on _________.     Please call our office as soon as possible. Our phone number is [phone]. Please press option 1. Our office is open 8a-12noon and 1p-5p, Monday through Friday.     Thank you,    [facility]

## 2010-08-22 ENCOUNTER — Encounter (INDEPENDENT_AMBULATORY_CARE_PROVIDER_SITE_OTHER): Payer: Self-pay | Admitting: *Deleted

## 2010-08-22 ENCOUNTER — Other Ambulatory Visit: Payer: Self-pay | Admitting: Family Medicine

## 2010-08-22 ENCOUNTER — Other Ambulatory Visit (INDEPENDENT_AMBULATORY_CARE_PROVIDER_SITE_OTHER): Payer: Medicaid Other

## 2010-08-22 DIAGNOSIS — E119 Type 2 diabetes mellitus without complications: Secondary | ICD-10-CM

## 2010-08-22 DIAGNOSIS — E785 Hyperlipidemia, unspecified: Secondary | ICD-10-CM

## 2010-08-22 LAB — HEPATIC FUNCTION PANEL
ALT: 21 U/L (ref 0–35)
AST: 18 U/L (ref 0–37)
Albumin: 3.8 g/dL (ref 3.5–5.2)
Alkaline Phosphatase: 79 U/L (ref 39–117)
Bilirubin, Direct: 0.1 mg/dL (ref 0.0–0.3)
Total Bilirubin: 0.3 mg/dL (ref 0.3–1.2)
Total Protein: 6.8 g/dL (ref 6.0–8.3)

## 2010-08-22 LAB — HEMOGLOBIN A1C: Hgb A1c MFr Bld: 8.3 % — ABNORMAL HIGH (ref 4.6–6.5)

## 2010-08-22 LAB — BASIC METABOLIC PANEL
BUN: 16 mg/dL (ref 6–23)
CO2: 29 mEq/L (ref 19–32)
Calcium: 8.9 mg/dL (ref 8.4–10.5)
Chloride: 101 mEq/L (ref 96–112)
Creatinine, Ser: 0.8 mg/dL (ref 0.4–1.2)
GFR: 78.76 mL/min (ref >60.00)
Glucose, Bld: 179 mg/dL — ABNORMAL HIGH (ref 70–99)
Potassium: 4.7 mEq/L (ref 3.5–5.1)
Sodium: 138 mEq/L (ref 135–145)

## 2010-08-22 LAB — LIPID PANEL
Cholesterol: 228 mg/dL — ABNORMAL HIGH (ref 0–200)
HDL: 40.5 mg/dL (ref >39.00)
Total CHOL/HDL Ratio: 6
Triglycerides: 209 mg/dL — ABNORMAL HIGH (ref 0.0–149.0)
VLDL: 41.8 mg/dL — ABNORMAL HIGH (ref 0.0–40.0)

## 2010-08-22 LAB — MICROALBUMIN / CREATININE URINE RATIO
Creatinine,U: 134.6 mg/dL
Microalb Creat Ratio: 0.4 mg/g (ref 0.0–30.0)
Microalb, Ur: 0.6 mg/dL (ref 0.0–1.9)

## 2010-08-22 LAB — LDL CHOLESTEROL, DIRECT: Direct LDL: 167.2 mg/dL

## 2010-08-24 NOTE — Medication Information (Signed)
Summary: [facility]  [facility]   Imported By: Lanelle Bal 08/15/2010 10:53:17  _____________________________________________________________________  External Attachment:    Type:   Image     Comment:   External Document

## 2010-08-24 NOTE — Medication Information (Signed)
Summary: [facility]  [facility]   Imported By: Lanelle Bal 08/19/2010 09:49:01  _____________________________________________________________________  External Attachment:    Type:   Image     Comment:   External Document

## 2010-09-01 ENCOUNTER — Ambulatory Visit (INDEPENDENT_AMBULATORY_CARE_PROVIDER_SITE_OTHER): Payer: Medicare PPO

## 2010-09-01 ENCOUNTER — Encounter: Payer: Self-pay | Admitting: Internal Medicine

## 2010-09-01 DIAGNOSIS — Z79899 Other long term (current) drug therapy: Secondary | ICD-10-CM

## 2010-09-01 DIAGNOSIS — E7800 Pure hypercholesterolemia, unspecified: Secondary | ICD-10-CM

## 2010-09-06 ENCOUNTER — Ambulatory Visit: Payer: Medicaid Other | Admitting: Endocrinology

## 2010-09-07 NOTE — Assessment & Plan Note (Signed)
Summary: per renee at [facility] /lg  Medications Added METFORMIN HCL 500 MG XR24H-TAB (METFORMIN HCL) once daily every morning TRILIPIX 135 MG CPDR (CHOLINE FENOFIBRATE) 1 tablet daily CRESTOR 20 MG TABS (ROSUVASTATIN CALCIUM) 1 tablet daily        Visit Type:  Initial Consult Referring Provider:  Loreen Freud DO Primary Provider:  Laury Axon   History of Present Illness: Patient reports to lipid clinic for initial visit.  She has been on Lipitor and Tricor but has not been compliant with either for the past several months.  Her father passed away several months ago in [place] and it has been very stressful for her since she was an only child.  In the past she has tried gemfibrozil and welchol and was not tolerant to these medications. She stopped her Actos after she saw the T.V. ads about possible risks and stopped her Diovan due to dizziness.  She does not drink alcohol a lot due to her  many medications. She is not currently excercising; likes to walk but does not like to do it alone and her husband has COPD and can not walk with her.  They are interested in looking into renting a treadmill for their home.  Pt. has history of diabetes and checks her blood sugar once a day but it is not always fasting - 1 hour after a meal patient reports it was 270 mg/d.  Her diet is as listed below and mainly consists of breads, meats, and cheeses.  She claims to cook lunch at home frequently and does admit to frying her foods a lot.  They also seem to eat out a lot for breakfast.    Breakfast is coffee wtih cream and 2 waffles with syrup sometimes.   Lunch: She cooks food from [place]  She uses canola oil to cook and thinks her "killer" is bread.  The food contains beef, pork, sometimes chicken, and rice.  Currently not eating a lot of vegetables and says that if she does include vegetables they are beans.  Pt. likes cheese especially feta cheese.  She drinks diet soft drinks (3 cups/day), and diet cranberry  juice.  Dinner:  She does not eat a lot for dinner but likes to snack before bed.  Night Snacks - apples, cheese w/ white french bread or homemade white bread, Activia Yogurt, and "sugar free" sweet snacks.       Preventive Screening-Counseling & Management  Caffeine-Diet-Exercise     Does Patient Exercise: no  Medications Prior to Update: 1)  Actos 45 Mg  Tabs (Pioglitazone Hcl) .Marland Kitchen.. 1 Once Daily 2)  Lipitor 80 Mg  Tabs (Atorvastatin Calcium) .Marland Kitchen.. 1 At Bedtime 3)  Diovan 160 Mg  Tabs (Valsartan) .Marland Kitchen.. 1 Once Daily 4)  Onglyza 5 Mg Tabs (Saxagliptin Hcl) .... Take 1 Tabs 5)  Zoloft 100 Mg Tabs (Sertraline Hcl) .... 2 By Mouth Once Daily 6)  Glyburide 2.5 Mg Tabs (Glyburide) .Marland Kitchen.. 1 Bid 7)  Metformin Hcl 500 Mg Xr24h-Tab (Metformin Hcl) .... Once Daily At 2pm 8)  Freestyle Test  Strp (Glucose Blood) .... Check Two Times A Day. 9)  Nexium 40 Mg Cpdr (Esomeprazole Magnesium) .... Two Times A Day 10)  Tricor 145 Mg Tabs (Fenofibrate) .Marland Kitchen.. 1 By Mouth Daily 11)  Bayer Low Strength 81 Mg Tbec (Aspirin) .... Take Once Daily 12)  Alprazolam 0.25 Mg Tabs (Alprazolam) .Marland Kitchen.. 1 By Mouth Three Times A Day As Needed 13)  Cheratussin Ac 100-10 Mg/78ml Syrp (Guaifenesin-Codeine) .Marland Kitchen.. 1-2  Tsp By Mouth At Bedtime As Needed 14)  Nasonex 50 Mcg/act Susp (Mometasone Furoate) .... 2 Sprays Each Nostril Once Daily 15)  Fluconazole 150 Mg Tabs (Fluconazole) .Marland Kitchen.. 1 By Mouth Once Daily X1, May Repeat in 3 Days As Needed  Current Medications (verified): 1)  Onglyza 5 Mg Tabs (Saxagliptin Hcl) .... Take 1 Tabs 2)  Zoloft 100 Mg Tabs (Sertraline Hcl) .... 2 By Mouth Once Daily 3)  Glyburide 2.5 Mg Tabs (Glyburide) .Marland Kitchen.. 1 Bid 4)  Metformin Hcl 500 Mg Xr24h-Tab (Metformin Hcl) .... Once Daily Every Morning 5)  Freestyle Test  Strp (Glucose Blood) .... Check Two Times A Day. 6)  Nexium 40 Mg Cpdr (Esomeprazole Magnesium) .... Two Times A Day 7)  Bayer Low Strength 81 Mg Tbec (Aspirin) .... Take Once Daily 8)   Alprazolam 0.25 Mg Tabs (Alprazolam) .Marland Kitchen.. 1 By Mouth Three Times A Day As Needed 9)  Trilipix 135 Mg Cpdr (Choline Fenofibrate) .Marland Kitchen.. 1 Tablet Daily 10)  Crestor 20 Mg Tabs (Rosuvastatin Calcium) .Marland Kitchen.. 1 Tablet Daily  Allergies: No Known Drug Allergies  Past History:  Past Medical History: Last updated: 10/28/2009 1. Chest pain    --cath 10 years ago ok    --myoview 2007 and 05/2009: normal 2. Depression 3. Diabetes mellitus, type II 4. GERD 5. Hypertension 6. Hyperlipidemia 7. CTS ; 6/15-19/09 [facility]  PNA with sepsis,high LFTs(ALT 61)  Family History: Last updated: 09/01/2010 Father:arthritis, ? CA (deceased)  Mother: DM (deceased) Siblings:none   Social History: Last updated: 08/10/2009 no diet Retired Married Drug use-no Regular exercise-no Never Smoked Alcohol use-no [place]  Family History: Father:arthritis, ? CA (deceased)  Mother: DM (deceased) Siblings:none   Vital Signs:  Patient profile:   55 year old female Weight:      224.50 pounds Pulse rate:   80 / minute BP sitting:   146 / 90  (left arm)   Impression & Recommendations:  Problem # 1:  HYPERLIPIDEMIA (ICD-272.4) Assessment Unchanged Most recent lipid panel on 08/22/10 shows that levels have slightly worsened since last tested in June 2011.  TC 228 (goal <200); TG 209 (goal <150); HDL 40.5 which has decreased from 43.7 (goal >45); LDL 167.2 has increased from 155 (goal <70).  Patient has reported not being compliant with cholesterol medications over the past few months due to stress.  She was supposed to be taking Lipitor 80 mg daily and Tricor 145 mg daily.  Plan is to continue a statin and a fibrate, but we are changing them to Crestor 20mg  daily and Trilipix 135mg  daily due to availablilty of samples (financial issues may be of concern) and convenience.  Patient understand the importance of taking these medications everyday to reduce her cholesterol and has agreed to be more compliant.   Discussed dietary changes wtih the patient and gave her options to reduce fat and carbohydrate intake and increase her vegetables.  For breakfasts she will try to choose oatmeal, cheerios, or yogurt over breads choices and at lunch she will try to incorporate more non-starchy vegetables and use olive oil instead of canola oil and cut down on the amount of foods she fries.  In addition for night snacks the plan is to try to choose healthier options such as apples or yogurt. She is agreeable to starting an exercise regimen and plans to start with 5-10 minutes of walking on a treadmill daily.  Follow up with patient in 6 weeks at lipid clinic to assess progress.    The following  medications were removed from the medication list:    Lipitor 80 Mg Tabs (Atorvastatin calcium) .Marland Kitchen... 1 at bedtime    Tricor 145 Mg Tabs (Fenofibrate) .Marland Kitchen... 1 by mouth daily Her updated medication list for this problem includes:    Trilipix 135 Mg Cpdr (Choline fenofibrate) .Marland Kitchen... 1 tablet daily    Crestor 20 Mg Tabs (Rosuvastatin calcium) .Marland Kitchen... 1 tablet daily  Patient Instructions: 1)  Look into [employer] (used sports equipment) or a sports equipment rental place. 2)  Exercising will bring down your blood sugars and cholesterol. Start wtih 5-10 minutes per day.   3)  You are on the right types of medication for your cholesterol, but it is really important that you take the medications everyday.  4)  Your new cholesterol medications one of them is called Crestor and the other is called Trilipix.  Take this everyday. 5)  For breakfast try to eat oatmeal, cherrios, or yogurt.  Try to avoid biscuits, bagels, waffles, and pancakes because they are not good for your blood sugar. 6)  At lunch try to cook with olive oil and to incorporate vegetables like broccoli, carrots, cauliflower.  Try to limit "starchy vegetables" the ones that are white on the inside.  7)  For night snacks try to pick options like apples or yogurt 8)   Return to Lipid Clinic in 6 weeks.  9)  Labwork March 26 at 8:15 at [facility].  Lipid/Liver 272.0/v58.69

## 2010-09-19 ENCOUNTER — Telehealth: Payer: Self-pay | Admitting: Endocrinology

## 2010-09-19 ENCOUNTER — Telehealth (INDEPENDENT_AMBULATORY_CARE_PROVIDER_SITE_OTHER): Payer: Self-pay | Admitting: *Deleted

## 2010-09-27 NOTE — Progress Notes (Signed)
Summary: Refill Request  Phone Note Refill Request Message from:  Patient on September 19, 2010 11:02 AM  Refills Requested: Medication #1:  ONGLYZA 5 MG TABS TAKE 1 TABS   Supply Requested: 3 months  Medication #2:  ZOLOFT 100 MG TABS 2 by mouth once daily   Supply Requested: 3 months  Medication #3:  GLYBURIDE 2.5 MG TABS 1 bid   Supply Requested: 3 months [facility]  [phone]  Account number [id number]    Method Requested: Telephone to Pharmacy Next Appointment Scheduled: Lab appt in March Initial call taken by: Barnie Mort,  September 19, 2010 11:03 AM  Follow-up for Phone Call        Contacted patient and advised her to call Dr.Ellison for Onglyza and Glyburidesince is now managing her diabetes.....she voiced understanding Follow-up by: Almeta Monas CMA Duncan Dull),  September 19, 2010 4:16 PM    Prescriptions: ZOLOFT 100 MG TABS (SERTRALINE HCL) 2 by mouth once daily  #60 x 1   Entered by:   Almeta Monas CMA (AAMA)   Authorized by:   Loreen Freud DO   Signed by:   Almeta Monas CMA (AAMA) on 09/19/2010   Method used:   Electronically to        [facility] (retail)       [address]       [place], [place]  [address]       Ph: [phone]       Fax: [phone]   RxID:   [id number]

## 2010-09-27 NOTE — Progress Notes (Signed)
Summary: Rx refill req  Phone Note Refill Request Message from:  Patient     Prescriptions: ONGLYZA 5 MG TABS (SAXAGLIPTIN HCL) TAKE 1 TABS  #90 x 1   Entered by:   Margaret Pyle, CMA   Authorized by:   Minus Breeding MD   Signed by:   Margaret Pyle, CMA on 09/19/2010   Method used:   Faxed to ...       [facility] (mail-order)             , [place]         Ph: [phone]       Fax: [phone]   RxID:   [id number] GLYBURIDE 2.5 MG TABS (GLYBURIDE) 1 bid  #180 x 1   Entered by:   Margaret Pyle, CMA   Authorized by:   Minus Breeding MD   Signed by:   Margaret Pyle, CMA on 09/19/2010   Method used:   Faxed to ...       [facility] (mail-order)             , [place]         Ph: [phone]       Fax: [phone]   RxID:   [id number]

## 2010-10-10 ENCOUNTER — Other Ambulatory Visit: Payer: Medicare PPO

## 2010-10-13 ENCOUNTER — Ambulatory Visit: Payer: Medicare PPO

## 2010-10-14 ENCOUNTER — Ambulatory Visit: Payer: Medicare PPO | Admitting: Pharmacist

## 2010-10-14 ENCOUNTER — Other Ambulatory Visit (INDEPENDENT_AMBULATORY_CARE_PROVIDER_SITE_OTHER): Payer: Medicare PPO

## 2010-10-14 DIAGNOSIS — Z79899 Other long term (current) drug therapy: Secondary | ICD-10-CM

## 2010-10-14 DIAGNOSIS — E7800 Pure hypercholesterolemia, unspecified: Secondary | ICD-10-CM

## 2010-10-14 LAB — LIPID PANEL
Cholesterol: 139 mg/dL (ref 0–200)
HDL: 45.5 mg/dL (ref >39.00)
LDL Cholesterol: 77 mg/dL (ref 0–99)
Total CHOL/HDL Ratio: 3
Triglycerides: 81 mg/dL (ref 0.0–149.0)
VLDL: 16.2 mg/dL (ref 0.0–40.0)

## 2010-10-14 LAB — HEPATIC FUNCTION PANEL
ALT: 22 U/L (ref 0–35)
AST: 21 U/L (ref 0–37)
Albumin: 4.2 g/dL (ref 3.5–5.2)
Alkaline Phosphatase: 61 U/L (ref 39–117)
Bilirubin, Direct: 0.1 mg/dL (ref 0.0–0.3)
Total Bilirubin: 0.7 mg/dL (ref 0.3–1.2)
Total Protein: 7.1 g/dL (ref 6.0–8.3)

## 2010-10-17 ENCOUNTER — Encounter: Payer: Self-pay | Admitting: *Deleted

## 2010-10-17 ENCOUNTER — Ambulatory Visit (INDEPENDENT_AMBULATORY_CARE_PROVIDER_SITE_OTHER): Payer: Medicare PPO

## 2010-10-17 VITALS — BP 136/88 | HR 80 | Wt 220.0 lb

## 2010-10-17 DIAGNOSIS — E785 Hyperlipidemia, unspecified: Secondary | ICD-10-CM

## 2010-10-17 NOTE — Progress Notes (Signed)
Letter mailed

## 2010-10-17 NOTE — Patient Instructions (Signed)
Great job on the improvement in your cholesterol.  Continue Crestor and Trilipix  Try to continue to eat more fresh vegetables and decrease on fried foods.  This will help your cholesterol and your blood sugar.  Try to walk at least 10-15 minutes most days of the week.   Recheck labs in 3 months.

## 2010-10-18 NOTE — Progress Notes (Signed)
History of Present Illness:  Patient reports to lipid clinic for follow-up. At last visit she was restarted on Trilipix and Crestor.  She reports being very compliant with medications over the past month.  She does not have any complains of muscle pain, cramps, chest pain, or SOB.  She does report her CBGs running a little higher than last visit.  Her self-reported fasting blood sugars are ~130 and post-prandial (1 hr after meals)~180-200.  She does not have a f/u appt scheduled with Dr. Everardo All at this time.   Pt has tried to make some improvements in her diet.  She has incorporated more fresh vegetables into her cooking, especially spinach.  She has been visiting the [employer] to get her produce.  She has also started eating oatmeal for breakfast.  She did state she gets a little more hungry throughout the day when she eats oatmeal.  She has also tried to cut down on how often she is frying chicken and other meats.  She is still eating Activa as a late night snack before bed.    She is not currently exercising.  She was unable to find a treadmill and states she has had a lot of deaths in the family and with close friends so she has not had time to walk.  She hopes to start walking on a more regular basis now that the weather is nicer.    Current Outpatient Prescriptions  Medication Sig Dispense Refill  . ALPRAZolam (XANAX) 0.25 MG tablet Take 0.25 mg by mouth 3 (three) times daily as needed.        Marland Kitchen aspirin 81 MG tablet Take 81 mg by mouth daily.        . Choline Fenofibrate (TRILIPIX) 135 MG capsule Take 135 mg by mouth daily.        Marland Kitchen esomeprazole (NEXIUM) 40 MG capsule Take 40 mg by mouth daily before breakfast.        . glyBURIDE (DIABETA) 2.5 MG tablet Take 2.5 mg by mouth 2 (two) times daily.        . metFORMIN (GLUMETZA) 500 MG (MOD) 24 hr tablet Take 500 mg by mouth daily with breakfast.        . rosuvastatin (CRESTOR) 20 MG tablet Take 20 mg by mouth daily.        .  saxagliptin HCl (ONGLYZA) 5 MG TABS tablet Take 5 mg by mouth daily.        . sertraline (ZOLOFT) 100 MG tablet Take 200 mg by mouth daily.          No Known Allergies

## 2010-10-18 NOTE — Assessment & Plan Note (Addendum)
Pt's cholesterol much improved with medication compliance.  TC, TG and HDL all at goal.  LDL slightly above goal of <70 but still much improved from 167 to 77.  LFTs are WNL.  Pt tolerating medications well.   Will continue current regimen.  Encouraged pt to continue to make improvements in her diet and exercise.  Will ask her to increase walking to 10-15 minutes most days of the week and continue to incorporate more vegetables into diet.  Will f/u in 3 months with repeat labwork.

## 2010-10-20 LAB — HEMOGLOBIN A1C
Hgb A1c MFr Bld: 7.8 % — ABNORMAL HIGH (ref 4.6–6.1)
Mean Plasma Glucose: 177 mg/dL

## 2010-10-20 LAB — URINALYSIS, ROUTINE W REFLEX MICROSCOPIC
Bilirubin Urine: NEGATIVE
Glucose, UA: NEGATIVE mg/dL
Hgb urine dipstick: NEGATIVE
Ketones, ur: NEGATIVE mg/dL
Nitrite: NEGATIVE
Protein, ur: NEGATIVE mg/dL
Specific Gravity, Urine: 1.018 (ref 1.005–1.030)
Urobilinogen, UA: 1 mg/dL (ref 0.0–1.0)
pH: 6 (ref 5.0–8.0)

## 2010-10-20 LAB — CBC
HCT: 40.4 % (ref 36.0–46.0)
Hemoglobin: 14.2 g/dL (ref 12.0–15.0)
MCHC: 35 g/dL (ref 30.0–36.0)
MCV: 94.4 fL (ref 78.0–100.0)
Platelets: 195 10*3/uL (ref 150–400)
RBC: 4.28 MIL/uL (ref 3.87–5.11)
RDW: 13 % (ref 11.5–15.5)
WBC: 8.1 10*3/uL (ref 4.0–10.5)

## 2010-10-20 LAB — CARDIAC PANEL(CRET KIN+CKTOT+MB+TROPI)
CK, MB: 1 ng/mL (ref 0.3–4.0)
CK, MB: 1.3 ng/mL (ref 0.3–4.0)
CK, MB: 1.4 ng/mL (ref 0.3–4.0)
Relative Index: 1.3 (ref 0.0–2.5)
Relative Index: 1.3 (ref 0.0–2.5)
Relative Index: INVALID (ref 0.0–2.5)
Total CK: 103 U/L (ref 7–177)
Total CK: 111 U/L (ref 7–177)
Total CK: 69 U/L (ref 7–177)
Troponin I: 0.01 ng/mL (ref 0.00–0.06)
Troponin I: 0.01 ng/mL (ref 0.00–0.06)
Troponin I: 0.01 ng/mL (ref 0.00–0.06)

## 2010-10-20 LAB — GLUCOSE, CAPILLARY
Glucose-Capillary: 103 mg/dL — ABNORMAL HIGH (ref 70–99)
Glucose-Capillary: 144 mg/dL — ABNORMAL HIGH (ref 70–99)

## 2010-10-20 LAB — COMPREHENSIVE METABOLIC PANEL
ALT: 18 U/L (ref 0–35)
AST: 18 U/L (ref 0–37)
Albumin: 3.8 g/dL (ref 3.5–5.2)
Alkaline Phosphatase: 69 U/L (ref 39–117)
BUN: 14 mg/dL (ref 6–23)
CO2: 27 mEq/L (ref 19–32)
Calcium: 9 mg/dL (ref 8.4–10.5)
Chloride: 105 mEq/L (ref 96–112)
Creatinine, Ser: 0.68 mg/dL (ref 0.4–1.2)
GFR calc Af Amer: >60 mL/min (ref >60)
GFR calc non Af Amer: >60 mL/min (ref >60)
Glucose, Bld: 118 mg/dL — ABNORMAL HIGH (ref 70–99)
Potassium: 3.9 mEq/L (ref 3.5–5.1)
Sodium: 139 mEq/L (ref 135–145)
Total Bilirubin: 0.5 mg/dL (ref 0.3–1.2)
Total Protein: 6.9 g/dL (ref 6.0–8.3)

## 2010-10-20 LAB — URINE MICROSCOPIC-ADD ON

## 2010-10-20 LAB — TSH: TSH: 0.641 u[IU]/mL (ref 0.350–4.500)

## 2010-11-15 ENCOUNTER — Telehealth: Payer: Self-pay

## 2010-11-15 MED ORDER — ROSUVASTATIN CALCIUM 20 MG PO TABS: 20.0000 mg | ORAL_TABLET | Freq: Every day | ORAL | Status: DC

## 2010-11-15 NOTE — Telephone Encounter (Signed)
Called spoke with pt aware samples left at front, rx sent to pharmacy as requested.

## 2010-11-29 NOTE — Discharge Summary (Signed)
Donna Pham, Donna Pham            ACCOUNT NO.:  [id number]   MEDICAL RECORD NO.:  [id number]          PATIENT TYPE:  INP   LOCATION:  2019                         FACILITY:  [facility]   PHYSICIAN:  Bruce Rexene Edison. Swords, MD    DATE OF BIRTH:  1956/03/10   DATE OF ADMISSION:  12/30/2007  DATE OF DISCHARGE:  01/03/2008                               DISCHARGE SUMMARY   DISCHARGE DIAGNOSES:  1. Pneumonia with possible sepsis.  2. Elevated liver function tests.  3. Yeast urinary tract infection.  4. Diabetes.  5. Hypertension.  6. Hyperlipidemia.   DISCHARGE MEDICATIONS:  1. Robinul Forte 2 mg b.i.d.  2. Ultram 50 mg one tablet p.r.n.  3. Lyrica 50 mg p.o. daily.  4. Cymbalta 50 mg p.o. daily.  5. Actos 45 mg p.o. daily.  6. Lisinopril 10 mg p.o. daily.  7. Lipitor 80 mg nightly.  8. Gemfibrozil 600 mg b.i.d.  9. Glyburide 5 mg 2 p.o. b.i.d.  10.Vicodin p.r.n.  11.Prevacid 30 mg p.o. daily.  12.Augmentin 875 one p.o. b.i.d.  13.Azithromycin 250 mg p.o. daily for 3 days.   FOLLOWUP PLANS:  With Dr. Maurice March in 1 week.   CONDITION ON DISCHARGE:  Improved.   HOSPITAL PROCEDURES:  CT angio demonstrated no evidence of acute  pulmonary embolus, right lower lobe infiltrate noted.  Chest x-ray  demonstrated right lower lobe pneumonia.   HOSPITAL LABORATORIES:  Urine culture with greater than 100,000 yeast.  Blood cultures negative to date.   CMET normal except for glucose of 157, alkaline phosphatase 128, ALT 61,  albumin 2.6, and calcium 8.3.  CBC on January 01, 2008 demonstrated a white  count of 12.2.   Strep pneumococcal urinary antigen was negative.   Hemoglobin A1c 7.3%.   HOSPITAL COURSE:  The patient admitted to the hospital service with an  acute febrile illness.  Imaging studies were the old pneumonia.  She was  quite tachycardic and somewhat dyspneic on admission consistent with  sepsis.  Her symptoms were slow to improve on aggressive antibiotic  therapy.  At the time of  discharge, she had been afebrile for greater  than 24 hours.  She was ambulating in the halls without difficulty and  without oxygen therapy.  She is stable for discharge on antibiotics as  listed above.   Yeast UTI and amino-competent patient, no treatment.   Other medical problems as listed.  She will be discharged on medications  as listed.   Greater than 30 minutes in discharge planning.      Bruce Rexene Edison Swords, MD  Electronically Signed     BHS/MEDQ  D:  01/03/2008  T:  01/04/2008  Job:  [id number]

## 2010-12-02 NOTE — Letter (Signed)
July 25, 2006     RE:  Donna Pham, Donna Pham  MRN:  [id number]  /  DOB:  14-Jun-1956   To Whom It May Concern:   [name] is a 55 year old female, who first presented to me in  September 2006, complaining of severe carpal tunnel syndrome symptoms  that started while she was in [place] and the surgeons wanted to do the  surgery there, but she moved to [place].  She has complained of  severe pain since then.  The patient has been diagnosed with severe  carpal tunnel syndrome, depression, and she moved to [place] in  August of 2006.  The patient was seen by a hand surgeon in November of  2007.  Nerve conduction studies were redone.  They had been done in  [place], and the nerve conduction studies showed moderate carpal tunnel  syndrome bilaterally.  The hand surgeon also did a C-spine x-ray at that  time which showed mild degenerative changes at C6 and 7 with mild loss  of a disk space height.  The hand surgeon recommended surgery for the  carpal tunnel syndrome at that time.  The patient has seen me every  three or four months for severe depression and the carpal tunnel  syndrome pain and besides that, also has diabetes.   CURRENT MEDICATIONS:  1. Prevacid for reflux.  2. Lipitor 40 mg.  3. Lyrica 50 mg.  4. Glyburide 5 mg twice a day.  5. Cymbalta 60 mg a day.  6. Actos 45 mg a day.  7. Zoloft 50 mg a day.   The patient complains of constant pain in her neck and wrists and severe  depression, probably secondary to the pain.  She is in the process of  getting in to see a psychiatrist for evaluation as well.  Because of the  severe pain in her hands and wrists at this time, pending surgery, she  is  unable to do any of her previous work activities.  Since the surgery  date is not set, disability is expected to last 12 months, maybe longer,  depending on the success of the surgery.  The patient also needs  psychological counseling for her severe depression.  A  full set of  medical records is enclosed.  Please feel free to call us with any  further questions.    Sincerely,      Lelon Perla, DO  Electronically Signed    Shawnie Dapper  DD: 07/25/2006  DT: 07/25/2006  Job #: [id number]

## 2010-12-02 NOTE — Assessment & Plan Note (Signed)
[facility]                            CARDIOLOGY OFFICE NOTE   NAME:Rule, Donna                     MRN:          [id number]  DATE:09/12/2006                            DOB:          1955-10-26    REASON FOR CONSULTATION:  Chest pain.   HISTORY OF PRESENT ILLNESS:  Donna Pham is a very pleasant, 55 year old woman  from [place] who has a history of:  1. Diabetes.  2. Hypertension.  3. Hyperlipidemia.  4. Depression.  5. Anxiety.   She denies any history of known heart disease.  In fact, 3 years ago she  had a cardiac catheterization in [place] which showed normal coronaries.  She also underwent a nuclear stress test in our office in April, which  showed a normal ejection fraction and no evidence of ischemia.  EF was  73%.  She also had a echocardiogram last week, which showed a normal  ejection fraction with no significant valvular disease.  She presents  today with multiple complaints.  She says she gets pain allover.  This  includes chest pressure.  She says it is worse when she lies down in  bed, but she also gets it occasionally when walking, but not  reproducibly so.  It usually lasts a few minutes, and then resolves.  She also has pain from the back her neck radiating down both arms.  She  has been seen by Neurosurgery, who told her she may need an operation.  Finally, she complains of significant pain in her left leg x2 years.  She says it often keeps her from sleeping, however, there is no  claudication.  She denies any swelling down there.  She is soon to  undergo a workup for peripheral arterial disease through Dr. Laury Axon.  In  talking with her about these things, she thinks that most of her  symptoms are related to her anxiety or depression.  She is hoping that  Dr. Laury Axon will be able to refer her to a psychiatrist.   REVIEW OF SYSTEMS:  She is quite sedentary.  She does have severe  arthritis pain.  Also, has frequent headaches.   Also, severe  gastroesophageal reflux and some irritable bowel.  The remainder review  of systems is negative except for HPI and problem list.   PROBLEM LIST:  1. Diabetes x5 years.  2. Hypertension.  3. Hyperlipidemia.  4. Obesity.  5. Anxiety/depression.  6. Gastroesophageal reflux disease.  7. Osteoarthritis.  8. Carpal tunnel syndrome.   CURRENT MEDICATIONS:  1. Prevacid.  2. Lipitor 80 mg.  3. Lyrica 75 mg.  4. Glyburide 5 mg b.i.d.  5. Cymbalta 60 a day.  6. Actos 45 a day.  7. Zoloft 100 a day.  8. Requip.  9. Lisinopril 10 a day.  10.Over-the-counter pain medicines.   ALLERGIES:  NO KNOWN DRUG ALLERGIES.   SOCIAL HISTORY:  She is from [place]  She is married.  She has 2  children.  She has never smoked, and does not drink.   FAMILY HISTORY:  Mother died at 6 from a stroke.  Father is still  alive.  No history of premature heart disease.   PHYSICAL EXAMINATION:  She is well appearing, in no acute distress.  Ambulates around the clinic without any respiratory difficulty.  She is  quite anxious and frustrated at times.  Blood pressure is 114/80 with a heart rate of 88.  Her weight is 231.  HEENT:  Sclerae anicteric.  EOMI.  There is no xanthelasma.  Mucous  membranes are moist.  Oropharynx is clear.  NECK:  Supple.  There is no JVD.  Carotids are 2+ bilaterally without  any bruits.  There is no lymphadenopathy or thyromegaly.  CARDIAC:  She has a regular rate and rhythm with no murmurs, rubs or  gallops.  LUNGS:  Clear.  ABDOMEN:  Obese.  Non-tender.  Non-distended.  No hepatosplenomegaly.  No bruits.  No masses.  Good bowel sounds.  EXTREMITIES:  Warm with no cyanosis, clubbing or edema.  Left leg, there  is no cords or other deformities.  DP pulses are 1+ bilaterally.  No  rashes.  NEUROLOGIC:  She is alert and oriented x3.  Cranial nerves 2 through 12  are intact.  Moves all 4 extremities without difficulty.  EKG:  Shows normal sinus rhythm at a rate of  88.  There is left axis  deviation, poor R wave progression throughout the anterolateral  precordium.   ASSESSMENT AND PLAN:  Chest pain.  In light of her normal  catheterization several years ago, as well as normal recent stress test  and echocardiogram, I suspect her chest pain is noncardiac.  She does  have very small R waves anterolaterally.  However, her echocardiogram  shows normal wall motion.  I suspect this is just related to her body  habitus.  We did discuss the possibility of repeat catheterization or  stress testing, however, she is convinced that this is unlikely to be  her heart as well.  I suggested that she follow through with perhaps  going to see a psychiatrist.  I have also told her that she needs to  commit herself to losing weight and getting  exercise on a regular basis.  I gave her my card and reassurance, and I  said should she need further evaluation, she is happy to return here on  an as-needed basis.     Donna Buckles. Bensimhon, MD  Electronically Signed    DRB/MedQ  DD: 09/12/2006  DT: 09/12/2006  Job #: [id number]   cc:   Lelon Perla, DO

## 2010-12-02 NOTE — Assessment & Plan Note (Signed)
[facility]                         GASTROENTEROLOGY OFFICE NOTE   NAME:Donna Pham, Donna Pham                     MRN:          [id number]  DATE:10/03/2006                            DOB:          [birth year]    REFERRING PHYSICIAN:  Lelon Perla, DO   REASON FOR REFERRAL:  Diarrhea.   HISTORY OF PRESENT ILLNESS:  Donna Pham is a 55 year old, white  female referred through the courtesy of Dr. Laury Axon. She relates problems  with postprandial and stress related diarrhea for about 2-3 years. She  states she generally has 5-6 bowel movements a day. She describes her  diarrhea as watery and nonbloody, it is urgent and sometimes associated  with very minimal lower abdominal cramping. Stool studies for C  difficile toxin and enteric pathogens done on June 05, 2006 were  both negative. She states she has frequent problems with reflux and  intermittent vomiting. Her reflux and heartburn is generally bothersome  at night and she takes Prevacid regularly. She states the vomiting often  follows significant reflux symptoms. It is not associated with abdominal  pain, dysphagia, or odynophagia. She denies rectal bleeding, change in  bowel habits, change in stool caliber or weight loss. She notes  occasional hemorrhoidal swelling and discomfort. No family history of  colon cancer, colon polyps or inflammatory bowel disease.   PAST MEDICAL HISTORY:  Diabetes mellitus, hyperlipidemia, hypertension,  RLS, depression, obesity, GERD, osteoarthritis, carpal tunnel syndrome.   CURRENT MEDICATIONS:  Listed on the chart, updated and reviewed.   MEDICATION ALLERGIES:  None known.   SOCIAL HISTORY:  Per the handwritten form.   REVIEW OF SYSTEMS:  Per the handwritten form.   PHYSICAL EXAMINATION:  GENERAL:  Obese, white female in no acute  distress.  VITAL SIGNS:  Height 5 feet 6 inches, weight 229 pounds. Blood pressure  is 110/80, pulse 72 and regular.  HEENT:   Anicteric sclera, oropharynx clear.  CHEST:  Clear to auscultation bilaterally.  CARDIAC:  Regular rate and rhythm without murmurs appreciated.  ABDOMEN:  Soft, nontender, nondistended, normal active bowel sounds, no  palpable organomegaly, masses or hernias.  RECTAL:  Deferred at the time of colonoscopy.  EXTREMITIES:  Without clubbing, cyanosis or edema.  NEUROLOGIC:  Alert and oriented x3. Grossly nonfocal.   ASSESSMENT/PLAN:  1. Chronic diarrhea with typical features of irritable bowel syndrome.      Need to exclude colorectal neoplasms, inflammatory bowel disease      and other disorders. Obtain stool hemoccults, TSH, tissue      transglutaminase and IgA today. The risks, benefits, and      alternatives to colonoscopy with possible biopsy and possible      polypectomy discussed with the patient and she consents to proceed.      This will be scheduled electively. Begin Robinul Forte 1 b.i.d.      Avoid dietary triggers such as high fat foods and caffeine.  2. Gastroesophageal reflux disease with inadequate symptom control.      R/O gastroparesis and other disorders. She is to reintensify all      antireflux measures and use Prevacid  30 mg a day on a regular      basis. Consider increasing to b.i.d. therapy if her symptoms do not      come under adequate control. A long-term weight loss program would      likely benefit her reflux symptoms and she will discuss this      further with Dr. Laury Axon. If her symptoms remain active consider      upper endoscopy for further evaluation.     Venita Lick. Russella Dar, MD, [facility]  Electronically Signed    MTS/MedQ  DD: 10/03/2006  DT: 10/03/2006  Job #: [id number]   cc:   Lelon Perla, DO

## 2010-12-02 NOTE — Assessment & Plan Note (Signed)
[facility]                        [facility] OFFICE NOTE   NAME:Donna Pham, Donna Pham                     MRN:          [id number]  DATE:09/13/2006                            DOB:          1956-06-15    The patient is a 55 year old white female who is here to discuss her lab  work and followup with medications. She states that the Requip did not  help her for restless leg syndrome and she would like to try something  different and her anxiety and depression has been worsening. She states  that her husband tried to make an appointment with a psychiatrist, but  they had trouble getting an appointment.   PAST MEDICAL HISTORY:  Significant for:  1. Diabetes, Type 2.  2. Depression.  3. Reflux disease.  4. Hypertension.   PAST SURGICAL HISTORY:  In 2006, carpal tunnel syndrome.   FAMILY HISTORY:  Mother passed away from complications of diabetes. She  has a father who is still alive with arthritis.   SOCIAL HISTORY:  Denies any smoking, alcohol. She is married.   ALLERGIES:  No known drug allergies.   CURRENT MEDICATIONS:  1. Prevacid 30 mg a day.  2. Lipitor 40 mg a day.  3. Zoloft 100 mg a day.  4. Actos 45 mg a day.  5. Lyrica 50 mg at night.  6. Cymbalta 60 mg a day.  7. Lisinopril 10 mg a day.   PHYSICAL EXAMINATION:  Weight is 229, pulse 60, blood pressure 104/72.  She is awake, alert and oriented in no acute distress.  HEENT: Head is normocephalic, atraumatic. Eyes: Pupils are equal,  reactive to light. Tympanic membranes are intact bilaterally. Oropharynx  is clear.  NECK: is supple. No JVD. No bruits.  HEART: Positive S1, S2. No murmurs appreciated.  LUNGS:  Clear bilaterally. No rales, rhonchi or wheezing.  ABDOMEN: Soft and nontender.  Labs were reviewed with the patient.   ASSESSMENT/PLAN:  1. Type 2 diabetes. Will continue medications of Actos 45 mg and will      continue glyburide at the same dosage of 5 mg twice a  day. The      patient does want to try metformin again because this does not seem      to be the thing that was causing her diarrhea in the past so will      start low at 500 mg twice a day and recheck her labs in three      months. She will call us in the meantime if there are any further      problems.  2. Hypertension. Continue lisinopril.  3. Restless leg syndrome. The patient will try Mirapex sample. The      patient was instructed on how to use and side effects.  4. Depression/anxiety. Will refer to psych. There was a language      barrier. The husband apparently tried to make an appointment with      neurology and not psychiatry so our referral coordinator Helmut Muster      helped the patient find the right phone numbers so she can make her  appointment.  5. History of loose stools and diarrhea. The patient had an      appointment with GI several months ago, but cancelled it because      she was afraid. We did stop the Glucophage thinking that was      causing the diarrhea, but she has been off of the Glucophage for      several months now and has been no change. So we will refer her      back to GI. The patient was informed not to cancel her appointment      that was for her benefit that we were sending her there and the      patient understood.     Lelon Perla, DO  Electronically Signed    Shawnie Dapper  DD: 09/13/2006  DT: 09/13/2006  Job #: [id number]

## 2010-12-29 ENCOUNTER — Ambulatory Visit (INDEPENDENT_AMBULATORY_CARE_PROVIDER_SITE_OTHER): Payer: Medicare PPO | Admitting: Family Medicine

## 2010-12-29 ENCOUNTER — Encounter: Payer: Self-pay | Admitting: Family Medicine

## 2010-12-29 ENCOUNTER — Ambulatory Visit (INDEPENDENT_AMBULATORY_CARE_PROVIDER_SITE_OTHER)
Admission: RE | Admit: 2010-12-29 | Discharge: 2010-12-29 | Disposition: A | Discharge status: Home / Self Care | Payer: Medicare PPO | Source: Ambulatory Visit | Attending: Family Medicine | Admitting: Family Medicine

## 2010-12-29 DIAGNOSIS — K819 Cholecystitis, unspecified: Secondary | ICD-10-CM

## 2010-12-29 DIAGNOSIS — R42 Dizziness and giddiness: Secondary | ICD-10-CM

## 2010-12-29 DIAGNOSIS — R1110 Vomiting, unspecified: Secondary | ICD-10-CM

## 2010-12-29 DIAGNOSIS — Z79899 Other long term (current) drug therapy: Secondary | ICD-10-CM

## 2010-12-29 DIAGNOSIS — M25561 Pain in right knee: Secondary | ICD-10-CM

## 2010-12-29 DIAGNOSIS — M25569 Pain in unspecified knee: Secondary | ICD-10-CM

## 2010-12-29 DIAGNOSIS — R5383 Other fatigue: Secondary | ICD-10-CM

## 2010-12-29 DIAGNOSIS — K219 Gastro-esophageal reflux disease without esophagitis: Secondary | ICD-10-CM

## 2010-12-29 DIAGNOSIS — R1011 Right upper quadrant pain: Secondary | ICD-10-CM

## 2010-12-29 DIAGNOSIS — E119 Type 2 diabetes mellitus without complications: Secondary | ICD-10-CM

## 2010-12-29 DIAGNOSIS — I10 Essential (primary) hypertension: Secondary | ICD-10-CM

## 2010-12-29 DIAGNOSIS — E785 Hyperlipidemia, unspecified: Secondary | ICD-10-CM

## 2010-12-29 DIAGNOSIS — R109 Unspecified abdominal pain: Secondary | ICD-10-CM

## 2010-12-29 DIAGNOSIS — R5381 Other malaise: Secondary | ICD-10-CM

## 2010-12-29 LAB — BASIC METABOLIC PANEL
BUN: 19 mg/dL (ref 6–23)
CO2: 28 mEq/L (ref 19–32)
Calcium: 8.8 mg/dL (ref 8.4–10.5)
Chloride: 105 mEq/L (ref 96–112)
Creatinine, Ser: 0.6 mg/dL (ref 0.4–1.2)
GFR: 105.57 mL/min (ref >60.00)
Glucose, Bld: 102 mg/dL — ABNORMAL HIGH (ref 70–99)
Potassium: 4.4 mEq/L (ref 3.5–5.1)
Sodium: 139 mEq/L (ref 135–145)

## 2010-12-29 LAB — CBC WITH DIFFERENTIAL/PLATELET
Basophils Absolute: 0 10*3/uL (ref 0.0–0.1)
Basophils Relative: 0.2 % (ref 0.0–3.0)
Eosinophils Absolute: 0 10*3/uL (ref 0.0–0.7)
Eosinophils Relative: 0.2 % (ref 0.0–5.0)
HCT: 40.7 % (ref 36.0–46.0)
Hemoglobin: 13.8 g/dL (ref 12.0–15.0)
Lymphocytes Relative: 41.2 % (ref 12.0–46.0)
Lymphs Abs: 2.4 10*3/uL (ref 0.7–4.0)
MCHC: 33.9 g/dL (ref 30.0–36.0)
MCV: 96.7 fl (ref 78.0–100.0)
Monocytes Absolute: 0.6 10*3/uL (ref 0.1–1.0)
Monocytes Relative: 9.4 % (ref 3.0–12.0)
Neutro Abs: 2.9 10*3/uL (ref 1.4–7.7)
Neutrophils Relative %: 49 % (ref 43.0–77.0)
Platelets: 183 10*3/uL (ref 150.0–400.0)
RBC: 4.2 Mil/uL (ref 3.87–5.11)
RDW: 13.9 % (ref 11.5–14.6)
WBC: 5.9 10*3/uL (ref 4.5–10.5)

## 2010-12-29 LAB — HEPATIC FUNCTION PANEL
ALT: 17 U/L (ref 0–35)
AST: 17 U/L (ref 0–37)
Albumin: 3.9 g/dL (ref 3.5–5.2)
Alkaline Phosphatase: 56 U/L (ref 39–117)
Bilirubin, Direct: 0.1 mg/dL (ref 0.0–0.3)
Total Bilirubin: 0.7 mg/dL (ref 0.3–1.2)
Total Protein: 6.9 g/dL (ref 6.0–8.3)

## 2010-12-29 LAB — LIPID PANEL
Cholesterol: 137 mg/dL (ref 0–200)
HDL: 51.8 mg/dL (ref >39.00)
LDL Cholesterol: 70 mg/dL (ref 0–99)
Total CHOL/HDL Ratio: 3
Triglycerides: 74 mg/dL (ref 0.0–149.0)
VLDL: 14.8 mg/dL (ref 0.0–40.0)

## 2010-12-29 LAB — POCT URINALYSIS DIPSTICK
Bilirubin, UA: NEGATIVE
Blood, UA: NEGATIVE
Glucose, UA: NEGATIVE
Ketones, UA: NEGATIVE
Leukocytes, UA: NEGATIVE
Nitrite, UA: NEGATIVE
Protein, UA: NEGATIVE
Spec Grav, UA: 1.02
Urobilinogen, UA: 0.2
pH, UA: 6

## 2010-12-29 LAB — MICROALBUMIN / CREATININE URINE RATIO
Creatinine,U: 86.9 mg/dL
Microalb Creat Ratio: 0.3 mg/g (ref 0.0–30.0)
Microalb, Ur: 0.3 mg/dL (ref 0.0–1.9)

## 2010-12-29 LAB — HEMOGLOBIN A1C: Hgb A1c MFr Bld: 8 % — ABNORMAL HIGH (ref 4.6–6.5)

## 2010-12-29 LAB — VITAMIN B12: Vitamin B-12: 253 pg/mL (ref 211–911)

## 2010-12-29 LAB — TSH: TSH: 1.11 u[IU]/mL (ref 0.35–5.50)

## 2010-12-29 MED ORDER — ESOMEPRAZOLE MAGNESIUM 40 MG PO CPDR: 40.0000 mg | DELAYED_RELEASE_CAPSULE | Freq: Every day | ORAL | Status: DC

## 2010-12-29 MED ORDER — ROSUVASTATIN CALCIUM 20 MG PO TABS: 20.0000 mg | ORAL_TABLET | Freq: Every day | ORAL | Status: DC

## 2010-12-29 MED ORDER — SERTRALINE HCL 100 MG PO TABS: 200.0000 mg | ORAL_TABLET | Freq: Every day | ORAL | Status: DC

## 2010-12-29 NOTE — Assessment & Plan Note (Signed)
Cont meds Check labs 

## 2010-12-29 NOTE — Assessment & Plan Note (Signed)
F/u surgeon for GB

## 2010-12-29 NOTE — Assessment & Plan Note (Signed)
Check labs con't meds 

## 2010-12-29 NOTE — Assessment & Plan Note (Signed)
Pt has not been taking nexium Use dexilant samples until nexium comes in

## 2010-12-29 NOTE — Assessment & Plan Note (Signed)
F/u surgeon If pain worsens---go to ER

## 2010-12-29 NOTE — Patient Instructions (Signed)
Abdominal Pain Abdominal pain can be caused by many things. Your caregiver decides the seriousness of your pain by an examination and possibly blood tests and X-rays. Many cases can be observed and treated at home. Most abdominal pain is not caused by a disease and will probably improve without treatment. However, in many cases, more time must pass before a clear cause of the pain can be found. Before that point, it may not be known if you need more testing, or if hospitalization or surgery is needed. HOME CARE INSTRUCTIONS  Do not take laxatives unless directed by your caregiver.   Take pain medicine only as directed by your caregiver.   Only take over-the-counter or prescription medicines for pain, discomfort, or fever as directed by your caregiver.   Try a clear liquid diet (broth, tea, or water) for several days or as ordered by your caregiver. Slowly move to a bland diet as tolerated.  SEEK IMMEDIATE MEDICAL CARE IF:  The pain does not go away.   You or your child has an oral temperature above 100.4, not controlled by medicine.   You keep throwing up (vomiting).   The pain is felt only in portions of the abdomen. Pain in the right side could possibly be appendicitis. In an adult, pain in the left lower portion of the abdomen could be colitis or diverticulitis.   You pass bloody or black tarry stools.  MAKE SURE YOU:  Understand these instructions.   Will watch your condition.   Will get help right away if you are not doing well or get worse.  Document Released: 04/12/2005 Document Re-Released: 09/27/2009 [facility] Patient Information 2011 [place], [place].

## 2010-12-29 NOTE — Assessment & Plan Note (Signed)
Per ENDO Check labs

## 2010-12-29 NOTE — Progress Notes (Signed)
  Subjective:    Patient ID: Donna Pham, female    DOB: 11-14-1955, 55 y.o.   MRN: [id number]  Leg Pain    Pt here c/o legs cramping at night.  No calf pain, no sob, no chest pain.     Review of Systems  All other systems reviewed and are negative.       Objective:   Physical Exam  Constitutional: She is oriented to person, place, and time. She appears well-developed and well-nourished.  Cardiovascular: Normal rate, regular rhythm and normal heart sounds.   No murmur heard. Pulmonary/Chest: Effort normal and breath sounds normal.  Abdominal: Soft. Bowel sounds are normal. There is tenderness.    Musculoskeletal: Normal range of motion. She exhibits no edema and no tenderness.  Neurological: She is alert and oriented to person, place, and time.  Psychiatric: She has a normal mood and affect. Her behavior is normal.          Assessment & Plan:

## 2011-01-05 ENCOUNTER — Telehealth: Payer: Self-pay

## 2011-01-05 NOTE — Telephone Encounter (Signed)
Pt called requesting MD review and advise on medication adjustment based on DM labs ordered by Dr Laury Axon.

## 2011-01-05 NOTE — Telephone Encounter (Signed)
i reviewed Your a1c was 8.0.  The goal is different for each person.   i would be happy to see you back here gain if you wish

## 2011-01-05 NOTE — Telephone Encounter (Signed)
Pt advised to call back and schedule appt per SAE.

## 2011-01-10 ENCOUNTER — Encounter: Payer: Self-pay | Admitting: Endocrinology

## 2011-01-10 ENCOUNTER — Ambulatory Visit (INDEPENDENT_AMBULATORY_CARE_PROVIDER_SITE_OTHER): Payer: Medicare PPO | Admitting: Endocrinology

## 2011-01-10 DIAGNOSIS — E119 Type 2 diabetes mellitus without complications: Secondary | ICD-10-CM

## 2011-01-10 MED ORDER — GLYBURIDE 1.25 MG PO TABS: 1.2500 mg | ORAL_TABLET | Freq: Every day | ORAL | Status: DC

## 2011-01-10 MED ORDER — GABAPENTIN 300 MG PO CAPS: 300.0000 mg | ORAL_CAPSULE | Freq: Three times a day (TID) | ORAL | Status: DC

## 2011-01-10 NOTE — Patient Instructions (Addendum)
Change glyburide to 1.25 mg:  1 tab each morning, and 3 in the evening. Otherwise, you should continue the same medications.   good diet and exercise habits significanly improve the control of your diabetes.  please let me know if you wish to be referred to a dietician.  high blood sugar is very risky to your health.  you should see an eye doctor every year. controlling your blood pressure and cholesterol drastically reduces the damage diabetes does to your body.  this also applies to quitting smoking.  please discuss these with your doctor.  you should take an aspirin every day, unless you have been advised by a doctor not to. Please make a follow-up appointment in 3 months. Start gabapentin 300 mg each evening.

## 2011-01-10 NOTE — Progress Notes (Signed)
  Subjective:    Patient ID: Donna Pham, female    DOB: 01-16-1956, 55 y.o.   MRN: [id number]  HPI Pt states of few years of severe pain of the legs, in the context of sleeping.  No assoc numbness.   no cbg record, but states cbg's vary from 73-250.  It is lowest in the afternoon, and highest in am.   Past Medical History  Diagnosis Date  . Depression   . Diabetes mellitus   . GERD (gastroesophageal reflux disease)   . Hyperlipidemia   . Hypertension   . CTS (carpal tunnel syndrome)     6/15-19/09 RLL PNA with sepsis high LFTs (ALT 61)    No past surgical history on file.  History   Social History  . Marital Status: Married    Spouse Name: N/A    Number of Children: N/A  . Years of Education: N/A   Occupational History  . Retired    Social History Main Topics  . Smoking status: Never Smoker   . Smokeless tobacco: Never Used  . Alcohol Use: No  . Drug Use: No  . Sexually Active: Not on file   Other Topics Concern  . Not on file   Social History Narrative   No diet Regular exercise[place]    Current Outpatient Prescriptions on File Prior to Visit  Medication Sig Dispense Refill  . ALPRAZolam (XANAX) 0.25 MG tablet Take 0.25 mg by mouth 3 (three) times daily as needed.        Marland Kitchen aspirin 81 MG tablet Take 81 mg by mouth daily.        Marland Kitchen esomeprazole (NEXIUM) 40 MG capsule Take 1 capsule (40 mg total) by mouth daily before breakfast.  90 capsule  3  . glucose blood (FREESTYLE LITE) test strip Check blood sugar two times daily       . metFORMIN (GLUCOPHAGE-XR) 500 MG 24 hr tablet Take 500 mg by mouth daily with breakfast.        . pioglitazone (ACTOS) 45 MG tablet Take 45 mg by mouth daily.        . rosuvastatin (CRESTOR) 20 MG tablet Take 1 tablet (20 mg total) by mouth daily.  90 tablet  1  . saxagliptin HCl (ONGLYZA) 5 MG TABS tablet Take 5 mg by mouth daily.        . sertraline (ZOLOFT) 100 MG tablet Take 2 tablets (200 mg total) by mouth  daily.  180 tablet  2    No Known Allergies  Family History  Problem Relation Age of Onset  . Arthritis Father   . Cancer Father   . Diabetes Mother     BP 110/76  Pulse 74  Temp(Src) 97 F (36.1 C) (Oral)  Ht 5\' 7"  (1.702 m)  Wt 228 lb 1.9 oz (103.475 kg)  BMI 35.73 kg/m2  SpO2 93%    Review of Systems denies hypoglycemia and loc    Objective:   Physical Exam Pulses: dorsalis pedis intact bilat.   Feet: no deformity.  no ulcer on the feet.  feet are of normal color and temp.  no edema Neuro: sensation is intact to touch on the feet     Lab Results  Component Value Date   HGBA1C 8.0* 12/29/2010     Assessment & Plan:  Leg pain, new.  ? Neuropathic Dm, needs increased rx

## 2011-01-17 ENCOUNTER — Other Ambulatory Visit: Payer: Self-pay | Admitting: Family Medicine

## 2011-01-17 ENCOUNTER — Ambulatory Visit (INDEPENDENT_AMBULATORY_CARE_PROVIDER_SITE_OTHER): Payer: Medicare PPO | Admitting: General Surgery

## 2011-01-17 DIAGNOSIS — E785 Hyperlipidemia, unspecified: Secondary | ICD-10-CM

## 2011-01-19 ENCOUNTER — Other Ambulatory Visit: Payer: Medicare PPO

## 2011-01-19 DIAGNOSIS — Z0289 Encounter for other administrative examinations: Secondary | ICD-10-CM

## 2011-01-19 NOTE — Progress Notes (Signed)
Labs only

## 2011-01-20 ENCOUNTER — Other Ambulatory Visit: Payer: Self-pay | Admitting: Family Medicine

## 2011-01-23 ENCOUNTER — Ambulatory Visit: Payer: Medicare PPO

## 2011-01-24 ENCOUNTER — Telehealth: Payer: Self-pay | Admitting: Endocrinology

## 2011-01-24 ENCOUNTER — Other Ambulatory Visit: Payer: Self-pay | Admitting: Endocrinology

## 2011-01-24 NOTE — Telephone Encounter (Signed)
Rx sent to pharmacy, pt informed.  

## 2011-01-24 NOTE — Telephone Encounter (Signed)
Patient is requesting a refill on her Actos 45mg . Patient is out completely and has missed a couple of days taking the Actos. Please advise and contact patient.  Thanks

## 2011-02-20 ENCOUNTER — Ambulatory Visit (INDEPENDENT_AMBULATORY_CARE_PROVIDER_SITE_OTHER): Payer: Medicare PPO | Admitting: General Surgery

## 2011-04-13 LAB — BASIC METABOLIC PANEL
BUN: 10
CO2: 28
Calcium: 8.7
Chloride: 100
Creatinine, Ser: 0.88
GFR calc Af Amer: >60
GFR calc non Af Amer: >60
Glucose, Bld: 170 — ABNORMAL HIGH
Potassium: 4.2
Sodium: 137

## 2011-04-13 LAB — URINALYSIS, ROUTINE W REFLEX MICROSCOPIC
Bilirubin Urine: NEGATIVE
Glucose, UA: NEGATIVE
Hgb urine dipstick: NEGATIVE
Ketones, ur: 15 — AB
Nitrite: POSITIVE — AB
Protein, ur: 30 — AB
Specific Gravity, Urine: 1.025
Urobilinogen, UA: 0.2
pH: 5.5

## 2011-04-13 LAB — HEPATIC FUNCTION PANEL
ALT: 81 — ABNORMAL HIGH
ALT: 92 — ABNORMAL HIGH
AST: 45 — ABNORMAL HIGH
AST: 55 — ABNORMAL HIGH
Albumin: 3 — ABNORMAL LOW
Albumin: 3.2 — ABNORMAL LOW
Alkaline Phosphatase: 152 — ABNORMAL HIGH
Alkaline Phosphatase: 154 — ABNORMAL HIGH
Bilirubin, Direct: 0.2
Bilirubin, Direct: 0.4 — ABNORMAL HIGH
Indirect Bilirubin: 0.6
Indirect Bilirubin: 0.7
Total Bilirubin: 0.8
Total Bilirubin: 1.1
Total Protein: 6.2
Total Protein: 7.2

## 2011-04-13 LAB — COMPREHENSIVE METABOLIC PANEL
ALT: 61 — ABNORMAL HIGH
AST: 36
Albumin: 2.6 — ABNORMAL LOW
Alkaline Phosphatase: 128 — ABNORMAL HIGH
BUN: 7
CO2: 26
Calcium: 8.3 — ABNORMAL LOW
Chloride: 100
Creatinine, Ser: 0.86
GFR calc Af Amer: >60
GFR calc non Af Amer: >60
Glucose, Bld: 157 — ABNORMAL HIGH
Potassium: 3.5
Sodium: 135
Total Bilirubin: 0.7
Total Protein: 6.6

## 2011-04-13 LAB — DIFFERENTIAL
Basophils Absolute: 0
Basophils Relative: 0
Eosinophils Absolute: 0
Eosinophils Relative: 0
Lymphocytes Relative: 8 — ABNORMAL LOW
Lymphs Abs: 1
Monocytes Absolute: 0.9
Monocytes Relative: 7
Neutro Abs: 10.5 — ABNORMAL HIGH
Neutrophils Relative %: 85 — ABNORMAL HIGH

## 2011-04-13 LAB — CBC
HCT: 36.1
HCT: 39
Hemoglobin: 12.6
Hemoglobin: 13.4
MCHC: 34.5
MCHC: 35
MCV: 93.7
MCV: 93.8
Platelets: 190
Platelets: 208
RBC: 3.85 — ABNORMAL LOW
RBC: 4.15
RDW: 13.2
RDW: 13.4
WBC: 12.2 — ABNORMAL HIGH
WBC: 12.4 — ABNORMAL HIGH

## 2011-04-13 LAB — URINE CULTURE
Colony Count: >=100000
Special Requests: POSITIVE

## 2011-04-13 LAB — HEMOGLOBIN A1C
Hgb A1c MFr Bld: 7.3 — ABNORMAL HIGH
Mean Plasma Glucose: 183

## 2011-04-13 LAB — URINE MICROSCOPIC-ADD ON

## 2011-04-13 LAB — AMYLASE: Amylase: 46

## 2011-04-13 LAB — STREP PNEUMONIAE URINARY ANTIGEN: Strep Pneumo Urinary Antigen: NEGATIVE

## 2011-04-13 LAB — LIPASE, BLOOD: Lipase: 16

## 2011-04-13 LAB — CULTURE, BLOOD (SINGLE): Culture: NO GROWTH

## 2011-04-15 ENCOUNTER — Other Ambulatory Visit: Payer: Self-pay | Admitting: Endocrinology

## 2011-04-17 ENCOUNTER — Emergency Department (HOSPITAL_COMMUNITY): Payer: Medicare PPO

## 2011-04-17 ENCOUNTER — Emergency Department (HOSPITAL_COMMUNITY)
Admission: EM | Admit: 2011-04-17 | Discharge: 2011-04-17 | Disposition: A | Discharge status: Home / Self Care | Payer: Medicare PPO | Attending: Emergency Medicine | Admitting: Emergency Medicine

## 2011-04-17 DIAGNOSIS — E789 Disorder of lipoprotein metabolism, unspecified: Secondary | ICD-10-CM | POA: Insufficient documentation

## 2011-04-17 DIAGNOSIS — F411 Generalized anxiety disorder: Secondary | ICD-10-CM | POA: Insufficient documentation

## 2011-04-17 DIAGNOSIS — R42 Dizziness and giddiness: Secondary | ICD-10-CM | POA: Insufficient documentation

## 2011-04-17 DIAGNOSIS — R112 Nausea with vomiting, unspecified: Secondary | ICD-10-CM | POA: Insufficient documentation

## 2011-04-17 DIAGNOSIS — R079 Chest pain, unspecified: Secondary | ICD-10-CM | POA: Insufficient documentation

## 2011-04-17 DIAGNOSIS — K2970 Gastritis, unspecified, without bleeding: Secondary | ICD-10-CM | POA: Insufficient documentation

## 2011-04-17 DIAGNOSIS — I10 Essential (primary) hypertension: Secondary | ICD-10-CM | POA: Insufficient documentation

## 2011-04-17 DIAGNOSIS — R1013 Epigastric pain: Secondary | ICD-10-CM | POA: Insufficient documentation

## 2011-04-17 DIAGNOSIS — E119 Type 2 diabetes mellitus without complications: Secondary | ICD-10-CM | POA: Insufficient documentation

## 2011-04-17 DIAGNOSIS — R5381 Other malaise: Secondary | ICD-10-CM | POA: Insufficient documentation

## 2011-04-17 DIAGNOSIS — Z79899 Other long term (current) drug therapy: Secondary | ICD-10-CM | POA: Insufficient documentation

## 2011-04-17 DIAGNOSIS — R5383 Other fatigue: Secondary | ICD-10-CM | POA: Insufficient documentation

## 2011-04-17 LAB — CBC
HCT: 41.3 % (ref 36.0–46.0)
Hemoglobin: 14.4 g/dL (ref 12.0–15.0)
MCH: 32.2 pg (ref 26.0–34.0)
MCHC: 34.9 g/dL (ref 30.0–36.0)
MCV: 92.4 fL (ref 78.0–100.0)
Platelets: 206 10*3/uL (ref 150–400)
RBC: 4.47 MIL/uL (ref 3.87–5.11)
RDW: 13 % (ref 11.5–15.5)
WBC: 10.6 10*3/uL — ABNORMAL HIGH (ref 4.0–10.5)

## 2011-04-17 LAB — COMPREHENSIVE METABOLIC PANEL
ALT: 23 U/L (ref 0–35)
AST: 19 U/L (ref 0–37)
Albumin: 3.9 g/dL (ref 3.5–5.2)
Alkaline Phosphatase: 71 U/L (ref 39–117)
BUN: 12 mg/dL (ref 6–23)
CO2: 31 mEq/L (ref 19–32)
Calcium: 9.4 mg/dL (ref 8.4–10.5)
Chloride: 100 mEq/L (ref 96–112)
Creatinine, Ser: 0.59 mg/dL (ref 0.50–1.10)
GFR calc Af Amer: >90 mL/min (ref >90)
GFR calc non Af Amer: >90 mL/min (ref >90)
Glucose, Bld: 179 mg/dL — ABNORMAL HIGH (ref 70–99)
Potassium: 3.5 mEq/L (ref 3.5–5.1)
Sodium: 138 mEq/L (ref 135–145)
Total Bilirubin: 0.4 mg/dL (ref 0.3–1.2)
Total Protein: 7.2 g/dL (ref 6.0–8.3)

## 2011-04-17 LAB — URINE MICROSCOPIC-ADD ON

## 2011-04-17 LAB — BASIC METABOLIC PANEL
BUN: 14 mg/dL (ref 6–23)
CO2: 28 mEq/L (ref 19–32)
Calcium: 9.4 mg/dL (ref 8.4–10.5)
Chloride: 101 mEq/L (ref 96–112)
Creatinine, Ser: 0.55 mg/dL (ref 0.50–1.10)
GFR calc Af Amer: >90 mL/min (ref >90)
GFR calc non Af Amer: >90 mL/min (ref >90)
Glucose, Bld: 155 mg/dL — ABNORMAL HIGH (ref 70–99)
Potassium: 3.8 mEq/L (ref 3.5–5.1)
Sodium: 137 mEq/L (ref 135–145)

## 2011-04-17 LAB — DIFFERENTIAL
Basophils Absolute: 0 10*3/uL (ref 0.0–0.1)
Basophils Relative: 0 % (ref 0–1)
Eosinophils Absolute: 0 10*3/uL (ref 0.0–0.7)
Eosinophils Relative: 0 % (ref 0–5)
Lymphocytes Relative: 28 % (ref 12–46)
Lymphs Abs: 3 10*3/uL (ref 0.7–4.0)
Monocytes Absolute: 0.8 10*3/uL (ref 0.1–1.0)
Monocytes Relative: 7 % (ref 3–12)
Neutro Abs: 6.9 10*3/uL (ref 1.7–7.7)
Neutrophils Relative %: 65 % (ref 43–77)

## 2011-04-17 LAB — URINALYSIS, ROUTINE W REFLEX MICROSCOPIC
Bilirubin Urine: NEGATIVE
Glucose, UA: NEGATIVE mg/dL
Hgb urine dipstick: NEGATIVE
Ketones, ur: 15 mg/dL — AB
Nitrite: NEGATIVE
Protein, ur: NEGATIVE mg/dL
Specific Gravity, Urine: 1.027 (ref 1.005–1.030)
Urobilinogen, UA: 0.2 mg/dL (ref 0.0–1.0)
pH: 5.5 (ref 5.0–8.0)

## 2011-04-17 LAB — LIPASE, BLOOD: Lipase: 31 U/L (ref 11–59)

## 2011-04-17 LAB — POCT I-STAT TROPONIN I: Troponin i, poc: 0 ng/mL (ref 0.00–0.08)

## 2011-04-17 LAB — GLUCOSE, CAPILLARY: Glucose-Capillary: 193 mg/dL — ABNORMAL HIGH (ref 70–99)

## 2011-04-25 ENCOUNTER — Other Ambulatory Visit (INDEPENDENT_AMBULATORY_CARE_PROVIDER_SITE_OTHER): Payer: Medicare PPO

## 2011-04-25 ENCOUNTER — Encounter: Payer: Self-pay | Admitting: Endocrinology

## 2011-04-25 ENCOUNTER — Ambulatory Visit (INDEPENDENT_AMBULATORY_CARE_PROVIDER_SITE_OTHER): Payer: Medicare PPO | Admitting: Endocrinology

## 2011-04-25 VITALS — BP 112/78 | HR 67 | Temp 98.0°F | Ht 67.0 in | Wt 232.8 lb

## 2011-04-25 DIAGNOSIS — E119 Type 2 diabetes mellitus without complications: Secondary | ICD-10-CM

## 2011-04-25 LAB — HEMOGLOBIN A1C: Hgb A1c MFr Bld: 7.5 % — ABNORMAL HIGH (ref 4.6–6.5)

## 2011-04-25 NOTE — Progress Notes (Signed)
Subjective:    Patient ID: Donna Pham, female    DOB: 10/16/1955, 55 y.o.   MRN: [id number]  HPI Pt went to er last week for cbg of 200.  It was in the mid to high-100's.  She had nausea last week, but it is better now.  She says she could increase metformin if necessary.  Multiple sxs. no cbg record today.   Past Medical History  Diagnosis Date  . Depression   . Diabetes mellitus   . GERD (gastroesophageal reflux disease)   . Hyperlipidemia   . Hypertension   . CTS (carpal tunnel syndrome)     6/15-19/09 RLL PNA with sepsis high LFTs (ALT 61)    No past surgical history on file.  History   Social History  . Marital Status: Married    Spouse Name: N/A    Number of Children: N/A  . Years of Education: N/A   Occupational History  . Retired    Social History Main Topics  . Smoking status: Never Smoker   . Smokeless tobacco: Never Used  . Alcohol Use: No  . Drug Use: No  . Sexually Active: Not on file   Other Topics Concern  . Not on file   Social History Narrative   No diet Regular exercise-no[place]    Current Outpatient Prescriptions on File Prior to Visit  Medication Sig Dispense Refill  . ACTOS 45 MG tablet TAKE 1 TABLET BY MOUTH DAILY  90 tablet  2  . ALPRAZolam (XANAX) 0.25 MG tablet Take 0.25 mg by mouth 3 (three) times daily as needed.        Marland Kitchen aspirin 81 MG tablet Take 81 mg by mouth daily.        Marland Kitchen esomeprazole (NEXIUM) 40 MG capsule Take 1 capsule (40 mg total) by mouth daily before breakfast.  90 capsule  3  . gabapentin (NEURONTIN) 300 MG capsule Take 1 capsule (300 mg total) by mouth 3 (three) times daily.  90 capsule  3  . glucose blood (FREESTYLE LITE) test strip Check blood sugar two times daily       . glyBURIDE (DIABETA) 1.25 MG tablet Take 1 tablet (1.25 mg total) by mouth daily with breakfast. 1 tab each morning, and 3 tabs in the evening  350 tablet  3  . metFORMIN (GLUCOPHAGE-XR) 500 MG 24 hr tablet TAKE 1 TABLET ONE TIME  DAILY AT 2 PM  90 tablet  2  . ONGLYZA 5 MG TABS tablet TAKE 1 TABLET DAILY  90 tablet  2  . rosuvastatin (CRESTOR) 20 MG tablet Take 1 tablet (20 mg total) by mouth daily.  90 tablet  1  . sertraline (ZOLOFT) 100 MG tablet Take 2 tablets (200 mg total) by mouth daily.  180 tablet  2    No Known Allergies  Family History  Problem Relation Age of Onset  . Arthritis Father   . Cancer Father   . Diabetes Mother     BP 112/78  Pulse 67  Temp(Src) 98 F (36.7 C) (Oral)  Ht 5\' 7"  (1.702 m)  Wt 232 lb 12.8 oz (105.597 kg)  BMI 36.46 kg/m2  SpO2 97%  Review of Systems denies hypoglycemia    Objective:   Physical Exam VITAL SIGNS:  See vs page GENERAL: no distress.  obese NECK: There is no palpable thyroid enlargement.  No thyroid nodule is palpable.  No palpable lymphadenopathy at the anterior neck.     Assessment & Plan:  Dm, with uncertain control.  She says she would like to try increasing metformin iff a1c indicates the necessity of this.

## 2011-04-25 NOTE — Patient Instructions (Addendum)
blood tests are being requested for you today.  please call [phone] to hear your test results.  You will be prompted to enter the 9-digit "MRN" number that appears at the top left of this page, followed by #.  Then you will hear the message. check your blood sugar 1 time a day.  vary the time of day when you check, between before the 3 meals, and at bedtime.  also check if you have symptoms of your blood sugar being too high or too low.  please keep a record of the readings and bring it to your next appointment here.  please call us sooner if you are having low blood sugar episodes, or if it stays over 200. You may need to take insulin soon.   Please come back for a follow-up appointment in January.

## 2011-05-19 ENCOUNTER — Encounter (INDEPENDENT_AMBULATORY_CARE_PROVIDER_SITE_OTHER): Payer: Self-pay | Admitting: General Surgery

## 2011-05-19 ENCOUNTER — Ambulatory Visit (INDEPENDENT_AMBULATORY_CARE_PROVIDER_SITE_OTHER): Payer: Medicare PPO | Admitting: General Surgery

## 2011-05-19 DIAGNOSIS — K828 Other specified diseases of gallbladder: Secondary | ICD-10-CM

## 2011-05-19 NOTE — Patient Instructions (Signed)

## 2011-05-19 NOTE — Assessment & Plan Note (Signed)
Pt has epigastric pain, post prandial nausea, and has had occasional episodes of severe pain in the RUQ.   Her HIDA scan was consistent with biliary dyskinesia.  These symptoms would likely resolve with cholecystectomy.   I do not think her left upper quadrant pain is related.  I advised her that this pain will likely not get better.    The surgical procedure was described to the patient in detail.  The patient was given Agricultural engineer. .  I discussed the incision type and location, the location of the gallbladder, the anatomy of the bile ducts and arteries, and the typical progression of surgery.  I discussed the possibility of converting to an open operation.  I advised of the risks of bleeding, infection, damage to other structures (such as the bile duct, intestine or liver), bile leak, need for other procedures or surgeries, and post op diarrhea/constipation.  We discussed the risk of blood clot.  We discussed the recovery period and post operative restrictions.  The patient was advised against taking blood thinners the week before surgery.

## 2011-05-19 NOTE — Progress Notes (Signed)
Chief Complaint  Patient presents with  . Other    LTF-eval  RUQP    HISTORY: Patient is a 55 year old female with around a year postprandial nausea and epigastric abdominal pain. The abdominal pain is tolerable but the nausea is now with almost every meal.  the discomfort occurs most of the time at night.  She denies fevers and chills.  She does occasionally experience reflux.  She went to the ED 3 weeks ago for similar symptoms.    She also has some left upper quadrant abdominal pain, but this does not occur at the same time.  She is very anxious about surgery.  We saw her for biliary dyskinesia in Jan 2011 and attempted to schedule surgery, but she wanted to wait.    Past Medical History  Diagnosis Date  . Depression   . Diabetes mellitus   . GERD (gastroesophageal reflux disease)   . Hyperlipidemia   . Hypertension   . CTS (carpal tunnel syndrome)     6/15-19/09 RLL PNA with sepsis high LFTs (ALT 61)  . Arthritis   . Right upper quadrant abdominal pain     History reviewed. No pertinent past surgical history.  Current Outpatient Prescriptions  Medication Sig Dispense Refill  . ACTOS 45 MG tablet TAKE 1 TABLET BY MOUTH DAILY  90 tablet  2  . ALPRAZolam (XANAX) 0.25 MG tablet Take 0.25 mg by mouth 3 (three) times daily as needed.        Marland Kitchen aspirin 81 MG tablet Take 81 mg by mouth daily.        Marland Kitchen esomeprazole (NEXIUM) 40 MG capsule Take 1 capsule (40 mg total) by mouth daily before breakfast.  90 capsule  3  . gabapentin (NEURONTIN) 300 MG capsule Take 1 capsule (300 mg total) by mouth 3 (three) times daily.  90 capsule  3  . glucose blood (FREESTYLE LITE) test strip Check blood sugar two times daily       . glyBURIDE (DIABETA) 1.25 MG tablet Take 1 tablet (1.25 mg total) by mouth daily with breakfast. 1 tab each morning, and 3 tabs in the evening  350 tablet  3  . metFORMIN (GLUCOPHAGE-XR) 500 MG 24 hr tablet TAKE 1 TABLET ONE TIME DAILY AT 2 PM  90 tablet  2  . ONGLYZA 5 MG TABS  tablet TAKE 1 TABLET DAILY  90 tablet  2  . rosuvastatin (CRESTOR) 20 MG tablet Take 1 tablet (20 mg total) by mouth daily.  90 tablet  1  . sertraline (ZOLOFT) 100 MG tablet Take 2 tablets (200 mg total) by mouth daily.  180 tablet  2     No Known Allergies   Family History  Problem Relation Age of Onset  . Arthritis Father   . Cancer Father   . Diabetes Mother      History   Social History  . Marital Status: Married    Spouse Name: N/A    Number of Children: N/A  . Years of Education: N/A   Occupational History  . Retired    Social History Main Topics  . Smoking status: Never Smoker   . Smokeless tobacco: Never Used  . Alcohol Use: No  . Drug Use: No  . Sexually Active: None   Other Topics Concern  . None   Social History Narrative   No diet Regular [place]     REVIEW OF SYSTEMS - PERTINENT POSITIVES ONLY: 12 point review of systems negative other than  HPI and PMH except for arthritis pain.    EXAM: Filed Vitals:   05/19/11 1119  BP: 122/88  Pulse: 72  Temp: 96.8 F (36 C)    Gen:  No acute distress.  Well nourished and well groomed.  Pt is [place]. Neurological: Alert and oriented to person, place, and time. Coordination normal.  Head: Normocephalic and atraumatic.  Eyes: Conjunctivae are normal. Pupils are equal, round, and reactive to light. No scleral icterus.  Neck: Normal range of motion. Neck supple. No tracheal deviation or thyromegaly present.  Cardiovascular: Normal rate, rhythm. Respiratory: Effort normal.  No respiratory distress.  GI: Soft. Bowel sounds are normal. The abdomen is soft and nontender.  There is no rebound and no guarding. No surgical scars are present.   Musculoskeletal: Normal range of motion. Extremities are nontender.  Lymphadenopathy: No cervical, preauricular, postauricular or axillary adenopathy is present Skin: Skin is warm and dry. No rash noted. No diaphoresis. No erythema. No pallor. No  clubbing, cyanosis, or edema.   Psychiatric: Normal mood and affect. Behavior is normal. Judgment and thought content normal.    LABORATORY RESULTS: Available labs are reviewed from ED visit, LFTs normal, WBCs 10.6k   RADIOLOGY RESULTS: HIDA IMPRESSION:  1. Low gallbladder ejection fraction = 2 %. Common differential  diagnosis includes biliary dyskinesia, chronic cholecystitis, and  sphincter Oddi dysfunction.  2. Patent cystic duct and common bile duct.  3. Delayed biliary to bowel transit.   ASSESSMENT AND PLAN: Biliary dyskinesia Pt has epigastric pain, post prandial nausea, and has had occasional episodes of severe pain in the RUQ.   Her HIDA scan was consistent with biliary dyskinesia.  These symptoms would likely resolve with cholecystectomy.   I do not think her left upper quadrant pain is related.  I advised her that this pain will likely not get better.    The surgical procedure was described to the patient in detail.  The patient was given Agricultural engineer. .  I discussed the incision type and location, the location of the gallbladder, the anatomy of the bile ducts and arteries, and the typical progression of surgery.  I discussed the possibility of converting to an open operation.  I advised of the risks of bleeding, infection, damage to other structures (such as the bile duct, intestine or liver), bile leak, need for other procedures or surgeries, and post op diarrhea/constipation.  We discussed the risk of blood clot.  We discussed the recovery period and post operative restrictions.  The patient was advised against taking blood thinners the week before surgery.           Maudry Diego MD Surgical Oncology, General and Endocrine Surgery [facility]      Visit Diagnoses: 1. Biliary dyskinesia     Primary Care Physician: Loreen Freud, DO, DO

## 2011-05-22 ENCOUNTER — Telehealth: Payer: Self-pay | Admitting: *Deleted

## 2011-05-22 ENCOUNTER — Other Ambulatory Visit (INDEPENDENT_AMBULATORY_CARE_PROVIDER_SITE_OTHER): Payer: Self-pay | Admitting: General Surgery

## 2011-05-22 NOTE — Telephone Encounter (Signed)
Apparently patient received phone call from scheduling this morning telling her that you needed to see her ASAP; pt. became very concerned and scheduled OV, and since then she found out that there was no imminent need for appointment at this time, so she canceled 05/23/11 OV.  Pt asked about lab results and was told that her A1C was elevated; she would know like to know from you personally, does she need OV or medication change at this time or wait until January 2013 appt [unless problem arises before then].?

## 2011-05-22 NOTE — Telephone Encounter (Signed)
LMOM to inform Patient. 

## 2011-05-22 NOTE — Telephone Encounter (Signed)
Last a1c was 7.5%.  Please continue the same medications for dm

## 2011-05-23 ENCOUNTER — Ambulatory Visit (INDEPENDENT_AMBULATORY_CARE_PROVIDER_SITE_OTHER)
Admission: RE | Admit: 2011-05-23 | Discharge: 2011-05-23 | Disposition: A | Discharge status: Home / Self Care | Payer: Medicare PPO | Source: Ambulatory Visit | Attending: Family Medicine | Admitting: Family Medicine

## 2011-05-23 ENCOUNTER — Telehealth: Payer: Self-pay

## 2011-05-23 ENCOUNTER — Other Ambulatory Visit: Payer: Self-pay | Admitting: Family Medicine

## 2011-05-23 ENCOUNTER — Ambulatory Visit (INDEPENDENT_AMBULATORY_CARE_PROVIDER_SITE_OTHER): Payer: Medicare PPO | Admitting: Family Medicine

## 2011-05-23 ENCOUNTER — Encounter: Payer: Self-pay | Admitting: Family Medicine

## 2011-05-23 ENCOUNTER — Ambulatory Visit: Payer: Medicare PPO | Admitting: Endocrinology

## 2011-05-23 DIAGNOSIS — M25511 Pain in right shoulder: Secondary | ICD-10-CM

## 2011-05-23 DIAGNOSIS — M25529 Pain in unspecified elbow: Secondary | ICD-10-CM

## 2011-05-23 DIAGNOSIS — M25521 Pain in right elbow: Secondary | ICD-10-CM

## 2011-05-23 DIAGNOSIS — S46009A Unspecified injury of muscle(s) and tendon(s) of the rotator cuff of unspecified shoulder, initial encounter: Secondary | ICD-10-CM

## 2011-05-23 DIAGNOSIS — M25519 Pain in unspecified shoulder: Secondary | ICD-10-CM

## 2011-05-23 LAB — CBC WITH DIFFERENTIAL/PLATELET
Basophils Absolute: 0 10*3/uL (ref 0.0–0.1)
Basophils Relative: 0.1 % (ref 0.0–3.0)
Eosinophils Absolute: 0 10*3/uL (ref 0.0–0.7)
Eosinophils Relative: 0 % (ref 0.0–5.0)
HCT: 42.4 % (ref 36.0–46.0)
Hemoglobin: 14.4 g/dL (ref 12.0–15.0)
Lymphocytes Relative: 28.2 % (ref 12.0–46.0)
Lymphs Abs: 2.3 10*3/uL (ref 0.7–4.0)
MCHC: 33.9 g/dL (ref 30.0–36.0)
MCV: 95.8 fl (ref 78.0–100.0)
Monocytes Absolute: 0.7 10*3/uL (ref 0.1–1.0)
Monocytes Relative: 8.5 % (ref 3.0–12.0)
Neutro Abs: 5.1 10*3/uL (ref 1.4–7.7)
Neutrophils Relative %: 63.2 % (ref 43.0–77.0)
Platelets: 198 10*3/uL (ref 150.0–400.0)
RBC: 4.43 Mil/uL (ref 3.87–5.11)
RDW: 13.4 % (ref 11.5–14.6)
WBC: 8 10*3/uL (ref 4.5–10.5)

## 2011-05-23 LAB — BASIC METABOLIC PANEL
BUN: 19 mg/dL (ref 6–23)
CO2: 29 mEq/L (ref 19–32)
Calcium: 9.3 mg/dL (ref 8.4–10.5)
Chloride: 102 mEq/L (ref 96–112)
Creatinine, Ser: 0.9 mg/dL (ref 0.4–1.2)
GFR: 71.31 mL/min (ref >60.00)
Glucose, Bld: 180 mg/dL — ABNORMAL HIGH (ref 70–99)
Potassium: 4 mEq/L (ref 3.5–5.1)
Sodium: 138 mEq/L (ref 135–145)

## 2011-05-23 LAB — SEDIMENTATION RATE: Sed Rate: 15 mm/hr (ref 0–22)

## 2011-05-23 LAB — HEPATIC FUNCTION PANEL
ALT: 18 U/L (ref 0–35)
AST: 17 U/L (ref 0–37)
Albumin: 4.2 g/dL (ref 3.5–5.2)
Alkaline Phosphatase: 67 U/L (ref 39–117)
Bilirubin, Direct: 0 mg/dL (ref 0.0–0.3)
Total Bilirubin: 0.6 mg/dL (ref 0.3–1.2)
Total Protein: 7.5 g/dL (ref 6.0–8.3)

## 2011-05-23 MED ORDER — HYDROCODONE-ACETAMINOPHEN 5-500 MG PO TABS: 1.0000 | ORAL_TABLET | ORAL | Status: DC | PRN

## 2011-05-23 NOTE — Patient Instructions (Signed)
   Shoulder Pain The shoulder is a ball and socket joint. The muscles and tendons (rotator cuff) are what keep the shoulder in its joint and stable. This collection of muscles and tendons holds in the head (ball) of the humerus (upper arm bone) in the fossa (cup) of the scapula (shoulder blade). Today no reason was found for your shoulder pain. Often pain in the shoulder may be treated conservatively with temporary immobilization. For example, holding the shoulder in one place using a sling for rest. Physical therapy may be needed if problems continue. HOME CARE INSTRUCTIONS   Apply ice to the sore area for 15 to 20 minutes, 3 to 4 times per day for the first 2 days. Put the ice in a plastic bag. Place a towel between the bag of ice and your skin.   If you have or were given a shoulder sling and straps, do not remove for as long as directed by your caregiver or until you see a caregiver for a follow-up examination. If you need to remove it to shower or bathe, move your arm as little as possible.   Sleep on several pillows at night to lessen swelling and pain.   Only take over-the-counter or prescription medicines for pain, discomfort, or fever as directed by your caregiver.   Keep any follow-up appointments in order to avoid any type of permanent shoulder disability or chronic pain problems.  SEEK MEDICAL CARE IF:   Pain in your shoulder increases or new pain develops in your arm, hand, or fingers.   Your hand or fingers are colder than your other hand.   You do not obtain pain relief with the medications or your pain becomes worse.  SEEK IMMEDIATE MEDICAL CARE IF:   Your arm, hand, or fingers are numb or tingling.   Your arm, hand, or fingers are swollen, painful, or turn white or blue.   You develop chest pain or shortness of breath.  MAKE SURE YOU:   Understand these instructions.   Will watch your condition.   Will get help right away if you are not doing well or get worse.    Document Released: 04/12/2005 Document Revised: 03/15/2011 Document Reviewed: 11/15/2006 [facility] Patient Information 2012 [place], [place].

## 2011-05-23 NOTE — Telephone Encounter (Signed)
Message copied by Arnette Norris on Tue May 23, 2011  5:20 PM ------      Message from: Lelon Perla      Created: Tue May 23, 2011  2:04 PM       Refer to ortho---? Rot cuff injury

## 2011-05-23 NOTE — Telephone Encounter (Signed)
Discussed with patient and she voiced understanding. Referral put in        KP 

## 2011-05-23 NOTE — Progress Notes (Signed)
  Subjective:    Donna Pham is a 55 y.o. female who presents with right shoulder pain. The symptoms began several weeks ago. Aggravating factors: no known event. Pain is located around the acromioclavicular [facility] joint. Discomfort is described as burning, sharp/stabbing and throbbing. Symptoms are exacerbated by repetitive movements and overhead movements. Evaluation to date: none. Therapy to date includes: rest, ice and prescription NSAIDS which are not very effective.  The following portions of the patient's history were reviewed and updated as appropriate: allergies, current medications, past family history, past medical history, past social history, past surgical history and problem list.  Review of Systems Pertinent items are noted in HPI.   Objective:    BP 120/84  Pulse 80  Temp(Src) 97.7 F (36.5 C) (Oral)  Wt 239 lb (108.41 kg)  SpO2 97% Right shoulder: tenderness over the glenohumeral joint, distal neuromuscular exam negative, diminished motor function in shoulder and elbow and radial pulse intact  Left shoulder: normal active ROM, no tenderness, no impingement sign   R elbow--pain with palp over olecranon,  Warm to touch,  Min swelling  Assessment:    Right shoulder pain   R elbow pain Plan:  sling,   Natural history and expected course discussed. Questions answered. Agricultural engineer distributed. Reduction in offending activity. Rest, ice, compression, and elevation (RICE) therapy. Plain film x-rays. Orthopedics referral. f/u prn

## 2011-05-24 LAB — RHEUMATOID FACTOR: Rhuematoid fact SerPl-aCnc: <10 IU/mL (ref <=14)

## 2011-06-07 ENCOUNTER — Other Ambulatory Visit: Payer: Self-pay | Admitting: Family Medicine

## 2011-06-25 ENCOUNTER — Other Ambulatory Visit: Payer: Self-pay | Admitting: Family Medicine

## 2011-07-04 ENCOUNTER — Encounter: Payer: Self-pay | Admitting: Family Medicine

## 2011-07-04 ENCOUNTER — Ambulatory Visit (INDEPENDENT_AMBULATORY_CARE_PROVIDER_SITE_OTHER): Payer: Medicare PPO | Admitting: Family Medicine

## 2011-07-04 VITALS — BP 122/88 | HR 79 | Temp 98.3°F | Wt 237.8 lb

## 2011-07-04 DIAGNOSIS — E785 Hyperlipidemia, unspecified: Secondary | ICD-10-CM

## 2011-07-04 DIAGNOSIS — J40 Bronchitis, not specified as acute or chronic: Secondary | ICD-10-CM

## 2011-07-04 DIAGNOSIS — I10 Essential (primary) hypertension: Secondary | ICD-10-CM

## 2011-07-04 DIAGNOSIS — E119 Type 2 diabetes mellitus without complications: Secondary | ICD-10-CM

## 2011-07-04 DIAGNOSIS — J209 Acute bronchitis, unspecified: Secondary | ICD-10-CM

## 2011-07-04 LAB — CBC WITH DIFFERENTIAL/PLATELET
Basophils Absolute: 0 10*3/uL (ref 0.0–0.1)
Basophils Relative: 0.3 % (ref 0.0–3.0)
Eosinophils Absolute: 0 10*3/uL (ref 0.0–0.7)
Eosinophils Relative: 0.1 % (ref 0.0–5.0)
HCT: 44.1 % (ref 36.0–46.0)
Hemoglobin: 14.9 g/dL (ref 12.0–15.0)
Lymphocytes Relative: 36.3 % (ref 12.0–46.0)
Lymphs Abs: 3.8 10*3/uL (ref 0.7–4.0)
MCHC: 33.7 g/dL (ref 30.0–36.0)
MCV: 96.7 fl (ref 78.0–100.0)
Monocytes Absolute: 1 10*3/uL (ref 0.1–1.0)
Monocytes Relative: 9.7 % (ref 3.0–12.0)
Neutro Abs: 5.6 10*3/uL (ref 1.4–7.7)
Neutrophils Relative %: 53.6 % (ref 43.0–77.0)
Platelets: 185 10*3/uL (ref 150.0–400.0)
RBC: 4.55 Mil/uL (ref 3.87–5.11)
RDW: 13.7 % (ref 11.5–14.6)
WBC: 10.4 10*3/uL (ref 4.5–10.5)

## 2011-07-04 LAB — HEPATIC FUNCTION PANEL
ALT: 22 U/L (ref 0–35)
AST: 21 U/L (ref 0–37)
Albumin: 4.5 g/dL (ref 3.5–5.2)
Alkaline Phosphatase: 66 U/L (ref 39–117)
Bilirubin, Direct: 0 mg/dL (ref 0.0–0.3)
Total Bilirubin: 0.5 mg/dL (ref 0.3–1.2)
Total Protein: 7.6 g/dL (ref 6.0–8.3)

## 2011-07-04 LAB — POCT URINALYSIS DIPSTICK
Bilirubin, UA: NEGATIVE
Blood, UA: NEGATIVE
Glucose, UA: NEGATIVE
Ketones, UA: NEGATIVE
Leukocytes, UA: NEGATIVE
Nitrite, UA: NEGATIVE
Protein, UA: NEGATIVE
Urobilinogen, UA: 0.2
pH, UA: 5

## 2011-07-04 LAB — BASIC METABOLIC PANEL
BUN: 15 mg/dL (ref 6–23)
CO2: 27 mEq/L (ref 19–32)
Calcium: 9.5 mg/dL (ref 8.4–10.5)
Chloride: 106 mEq/L (ref 96–112)
Creatinine, Ser: 0.7 mg/dL (ref 0.4–1.2)
GFR: 87.27 mL/min (ref >60.00)
Glucose, Bld: 169 mg/dL — ABNORMAL HIGH (ref 70–99)
Potassium: 3.9 mEq/L (ref 3.5–5.1)
Sodium: 141 mEq/L (ref 135–145)

## 2011-07-04 LAB — MICROALBUMIN / CREATININE URINE RATIO
Creatinine,U: 213 mg/dL
Microalb Creat Ratio: 0.4 mg/g (ref 0.0–30.0)
Microalb, Ur: 0.8 mg/dL (ref 0.0–1.9)

## 2011-07-04 LAB — LIPID PANEL
Cholesterol: 132 mg/dL (ref 0–200)
HDL: 49 mg/dL (ref >39.00)
LDL Cholesterol: 63 mg/dL (ref 0–99)
Total CHOL/HDL Ratio: 3
Triglycerides: 102 mg/dL (ref 0.0–149.0)
VLDL: 20.4 mg/dL (ref 0.0–40.0)

## 2011-07-04 LAB — HEMOGLOBIN A1C: Hgb A1c MFr Bld: 7.3 % — ABNORMAL HIGH (ref 4.6–6.5)

## 2011-07-04 MED ORDER — ALBUTEROL SULFATE (5 MG/ML) 0.5% IN NEBU
2.5000 mg | INHALATION_SOLUTION | Freq: Once | RESPIRATORY_TRACT | Status: AC
  Administered 2011-07-04: 2.5 mg via RESPIRATORY_TRACT

## 2011-07-04 MED ORDER — AZITHROMYCIN 250 MG PO TABS: ORAL_TABLET | ORAL | Status: AC

## 2011-07-04 NOTE — Progress Notes (Signed)
  Subjective:     Donna Pham is a 54 y.o. female here for evaluation of a cough. Onset of symptoms was 3 weeks ago. Symptoms have been unchanged since that time. The cough is dry and is aggravated by reclining position. Associated symptoms include: wheezing. Patient does not have a history of asthma. Patient does have a history of environmental allergens. Patient has not traveled recently. Patient does not have a history of smoking. Patient has not had a previous chest x-ray. Patient has not had a PPD done.  The following portions of the patient's history were reviewed and updated as appropriate: allergies, current medications, past family history, past medical history, past social history, past surgical history and problem list.  Review of Systems Pertinent items are noted in HPI.    Objective:    Oxygen saturation 94% on room air BP 122/88  Pulse 79  Temp(Src) 98.3 F (36.8 C) (Oral)  Wt 237 lb 12.8 oz (107.865 kg)  SpO2 94% General appearance: alert, cooperative, appears stated age and no distress Eyes: conjunctivae/corneas clear. PERRL, EOM's intact. Fundi benign. Ears: normal TM's and external ear canals both ears Nose: Nares normal. Septum midline. Mucosa normal. No drainage or sinus tenderness. Throat: lips, mucosa, and tongue normal; teeth and gums normal Neck: no adenopathy, no carotid bruit, no JVD, supple, symmetrical, trachea midline and thyroid not enlarged, symmetric, no tenderness/mass/nodules Lungs: clear to auscultation bilaterally Breasts: not done    Assessment:    Acute Bronchitis    Plan:    Antibiotics per medication orders. Antitussives per medication orders. Avoid exposure to tobacco smoke and fumes. B-agonist inhaler. Call if shortness of breath worsens, blood in sputum, change in character of cough, development of fever or chills, inability to maintain nutrition and hydration. Avoid exposure to tobacco smoke and fumes.

## 2011-07-04 NOTE — Patient Instructions (Signed)

## 2011-07-24 ENCOUNTER — Encounter (INDEPENDENT_AMBULATORY_CARE_PROVIDER_SITE_OTHER): Payer: Medicare PPO | Admitting: General Surgery

## 2011-07-24 ENCOUNTER — Telehealth (INDEPENDENT_AMBULATORY_CARE_PROVIDER_SITE_OTHER): Payer: Self-pay | Admitting: General Surgery

## 2011-07-26 ENCOUNTER — Inpatient Hospital Stay (HOSPITAL_COMMUNITY): Admission: RE | Admit: 2011-07-26 | Payer: Medicare PPO | Source: Ambulatory Visit

## 2011-08-02 ENCOUNTER — Ambulatory Visit (HOSPITAL_COMMUNITY): Admission: RE | Admit: 2011-08-02 | Payer: Medicare Other | Source: Ambulatory Visit | Admitting: General Surgery

## 2011-08-02 ENCOUNTER — Encounter (HOSPITAL_COMMUNITY): Admission: RE | Payer: Self-pay | Source: Ambulatory Visit

## 2011-08-02 SURGERY — LAPAROSCOPIC CHOLECYSTECTOMY WITH INTRAOPERATIVE CHOLANGIOGRAM
Anesthesia: General

## 2011-09-14 ENCOUNTER — Telehealth: Payer: Self-pay | Admitting: Family Medicine

## 2011-09-14 NOTE — Telephone Encounter (Signed)
Message copied by Verdene Rio on Thu Sep 14, 2011 11:53 AM ------      Message from: Lelon Perla      Created: Thu Sep 14, 2011 11:17 AM      Regarding: FW: Refused Call a nurse        Someone needs to talk to her about her chest pain      ----- Message -----         From: Theodis Sato McDaniels         Sent: 09/14/2011  10:21 AM           To: Elwin Sleight, Lelon Perla, DO, #      Subject: Refused Call a nurse                                     Patient called was transferred to call a nurse due to her symptoms being chest pain. She hung up & called back & said she did not want to speak to a nurse only wanted to make an appointment as sometimes her meds., cause her to have chest pain. I did make her an appointment but If I need to do something else, please let me know      Thanks      Judeth Cornfield

## 2011-09-14 NOTE — Telephone Encounter (Signed)
Spoke with Pt advise her that she needs to be triage by nurse to determine if she needs to be seen earlier then her appt on 09-20-11. Pt ok will speak with nurse, call transferred to call-a-nurse.

## 2011-09-15 NOTE — Telephone Encounter (Signed)
Called Pt back to f/u on her symptoms Pt indicated that she is feeling much better today. Pt states that she did not speak with nurse on yesterday because she had to leave to go out so she did not get a chance to speak with someone. Pt indicated that she tried to call back on yesterday but she was unable to get anyone. Offer to connect Pt to the nurse now Pt ok call again transfer to call-a-nurse.

## 2011-09-19 NOTE — Telephone Encounter (Signed)
Called Donna Pham back to f/u since no info received for call-a-nurse. Spoke with Donna Pham who indicated that she is doing much better today and did not speak with nurse or go to the ED. Donna Pham indicated that she tried to hold for CAN but it took so long so she hung up again. Donna Pham currently denies any chest pain,SOB, numbness, or tingling in arms or legs, dizziness,nausea or vomiting. Donna Pham does note that she has a slight headache but she does have along on head congestion now. Donna Pham advise if she start to experience any chest pain, increase SOB, numbness or tingling dizziness she needs to be seen in ED prior to her schedule appt for tomorrow Donna Pham ok, verbalized understanding.

## 2011-09-20 ENCOUNTER — Ambulatory Visit (INDEPENDENT_AMBULATORY_CARE_PROVIDER_SITE_OTHER): Payer: Medicare Other | Admitting: Family Medicine

## 2011-09-20 ENCOUNTER — Encounter: Payer: Self-pay | Admitting: Family Medicine

## 2011-09-20 ENCOUNTER — Ambulatory Visit: Payer: Medicare PPO | Admitting: Family Medicine

## 2011-09-20 DIAGNOSIS — I798 Other disorders of arteries, arterioles and capillaries in diseases classified elsewhere: Secondary | ICD-10-CM

## 2011-09-20 DIAGNOSIS — F419 Anxiety disorder, unspecified: Secondary | ICD-10-CM

## 2011-09-20 DIAGNOSIS — I10 Essential (primary) hypertension: Secondary | ICD-10-CM

## 2011-09-20 DIAGNOSIS — E785 Hyperlipidemia, unspecified: Secondary | ICD-10-CM

## 2011-09-20 DIAGNOSIS — K219 Gastro-esophageal reflux disease without esophagitis: Secondary | ICD-10-CM

## 2011-09-20 DIAGNOSIS — E1142 Type 2 diabetes mellitus with diabetic polyneuropathy: Secondary | ICD-10-CM

## 2011-09-20 DIAGNOSIS — K649 Unspecified hemorrhoids: Secondary | ICD-10-CM

## 2011-09-20 DIAGNOSIS — R079 Chest pain, unspecified: Secondary | ICD-10-CM

## 2011-09-20 DIAGNOSIS — E1149 Type 2 diabetes mellitus with other diabetic neurological complication: Secondary | ICD-10-CM

## 2011-09-20 DIAGNOSIS — F411 Generalized anxiety disorder: Secondary | ICD-10-CM

## 2011-09-20 DIAGNOSIS — E1140 Type 2 diabetes mellitus with diabetic neuropathy, unspecified: Secondary | ICD-10-CM

## 2011-09-20 DIAGNOSIS — E1159 Type 2 diabetes mellitus with other circulatory complications: Secondary | ICD-10-CM

## 2011-09-20 DIAGNOSIS — E119 Type 2 diabetes mellitus without complications: Secondary | ICD-10-CM

## 2011-09-20 LAB — LIPID PANEL
Cholesterol: 144 mg/dL (ref 0–200)
HDL: 47.9 mg/dL (ref >39.00)
LDL Cholesterol: 67 mg/dL (ref 0–99)
Total CHOL/HDL Ratio: 3
Triglycerides: 148 mg/dL (ref 0.0–149.0)
VLDL: 29.6 mg/dL (ref 0.0–40.0)

## 2011-09-20 LAB — POCT URINALYSIS DIPSTICK
Bilirubin, UA: NEGATIVE
Blood, UA: NEGATIVE
Glucose, UA: 250
Ketones, UA: NEGATIVE
Leukocytes, UA: NEGATIVE
Nitrite, UA: NEGATIVE
Protein, UA: NEGATIVE
Spec Grav, UA: >=1.03
Urobilinogen, UA: 0.2
pH, UA: 5

## 2011-09-20 LAB — BASIC METABOLIC PANEL
BUN: 15 mg/dL (ref 6–23)
CO2: 27 mEq/L (ref 19–32)
Calcium: 9.6 mg/dL (ref 8.4–10.5)
Chloride: 99 mEq/L (ref 96–112)
Creatinine, Ser: 0.8 mg/dL (ref 0.4–1.2)
GFR: 76.26 mL/min (ref >60.00)
Glucose, Bld: 276 mg/dL — ABNORMAL HIGH (ref 70–99)
Potassium: 4.4 mEq/L (ref 3.5–5.1)
Sodium: 137 mEq/L (ref 135–145)

## 2011-09-20 LAB — MICROALBUMIN / CREATININE URINE RATIO
Creatinine,U: 172 mg/dL
Microalb Creat Ratio: 0.4 mg/g (ref 0.0–30.0)
Microalb, Ur: 0.7 mg/dL (ref 0.0–1.9)

## 2011-09-20 LAB — TROPONIN I: Troponin I: 0.01 ng/mL (ref <0.06)

## 2011-09-20 LAB — HEPATIC FUNCTION PANEL
ALT: 28 U/L (ref 0–35)
AST: 21 U/L (ref 0–37)
Albumin: 4.3 g/dL (ref 3.5–5.2)
Alkaline Phosphatase: 83 U/L (ref 39–117)
Bilirubin, Direct: 0.1 mg/dL (ref 0.0–0.3)
Total Bilirubin: 0.3 mg/dL (ref 0.3–1.2)
Total Protein: 7.4 g/dL (ref 6.0–8.3)

## 2011-09-20 LAB — CARDIAC PANEL
CK-MB: 0.9 ng/mL (ref 0.3–4.0)
Relative Index: 2.4 calc (ref 0.0–2.5)
Total CK: 37 U/L (ref 7–177)

## 2011-09-20 LAB — HEMOGLOBIN A1C: Hgb A1c MFr Bld: 9.2 % — ABNORMAL HIGH (ref 4.6–6.5)

## 2011-09-20 LAB — D-DIMER, QUANTITATIVE: D-Dimer, Quant: 0.31 ug/mL-FEU (ref 0.00–0.48)

## 2011-09-20 MED ORDER — SERTRALINE HCL 100 MG PO TABS: 200.0000 mg | ORAL_TABLET | Freq: Every day | ORAL | Status: DC

## 2011-09-20 MED ORDER — ESOMEPRAZOLE MAGNESIUM 40 MG PO CPDR: 40.0000 mg | DELAYED_RELEASE_CAPSULE | Freq: Every day | ORAL | Status: DC

## 2011-09-20 MED ORDER — PRAMOXINE-HC 1-1 % EX FOAM: 1.0000 "application " | Freq: Three times a day (TID) | CUTANEOUS | Status: DC

## 2011-09-20 MED ORDER — SAXAGLIPTIN HCL 5 MG PO TABS: ORAL_TABLET | ORAL | Status: DC

## 2011-09-20 MED ORDER — ROSUVASTATIN CALCIUM 20 MG PO TABS: 20.0000 mg | ORAL_TABLET | Freq: Every day | ORAL | Status: DC

## 2011-09-20 MED ORDER — GABAPENTIN 300 MG PO CAPS: 300.0000 mg | ORAL_CAPSULE | Freq: Three times a day (TID) | ORAL | Status: DC

## 2011-09-20 MED ORDER — METFORMIN HCL ER 500 MG PO TB24: ORAL_TABLET | ORAL | Status: DC

## 2011-09-20 MED ORDER — GLYBURIDE 1.25 MG PO TABS: 1.2500 mg | ORAL_TABLET | Freq: Every day | ORAL | Status: DC

## 2011-09-20 MED ORDER — GLYBURIDE 1.25 MG PO TABS: ORAL_TABLET | ORAL | Status: DC

## 2011-09-20 NOTE — Telephone Encounter (Signed)
Pt seen in office today.

## 2011-09-20 NOTE — Patient Instructions (Signed)
Chest Pain (Nonspecific) It is often hard to give a specific diagnosis for the cause of chest pain. There is always a chance that your pain could be related to something serious, such as a heart attack or a blood clot in the lungs. You need to follow up with your caregiver for further evaluation. CAUSES   Heartburn.   Pneumonia or bronchitis.   Anxiety or stress.   Inflammation around your heart (pericarditis) or lung (pleuritis or pleurisy).   A blood clot in the lung.   A collapsed lung (pneumothorax). It can develop suddenly on its own (spontaneous pneumothorax) or from injury (trauma) to the chest.   Shingles infection (herpes zoster virus).  The chest wall is composed of bones, muscles, and cartilage. Any of these can be the source of the pain.  The bones can be bruised by injury.   The muscles or cartilage can be strained by coughing or overwork.   The cartilage can be affected by inflammation and become sore (costochondritis).  DIAGNOSIS  Lab tests or other studies, such as X-rays, electrocardiography, stress testing, or cardiac imaging, may be needed to find the cause of your pain.  TREATMENT   Treatment depends on what may be causing your chest pain. Treatment may include:   Acid blockers for heartburn.   Anti-inflammatory medicine.   Pain medicine for inflammatory conditions.   Antibiotics if an infection is present.   You may be advised to change lifestyle habits. This includes stopping smoking and avoiding alcohol, caffeine, and chocolate.   You may be advised to keep your head raised (elevated) when sleeping. This reduces the chance of acid going backward from your stomach into your esophagus.   Most of the time, nonspecific chest pain will improve within 2 to 3 days with rest and mild pain medicine.  HOME CARE INSTRUCTIONS   If antibiotics were prescribed, take your antibiotics as directed. Finish them even if you start to feel better.   For the next few  days, avoid physical activities that bring on chest pain. Continue physical activities as directed.   Do not smoke.   Avoid drinking alcohol.   Only take over-the-counter or prescription medicine for pain, discomfort, or fever as directed by your caregiver.   Follow your caregiver's suggestions for further testing if your chest pain does not go away.   Keep any follow-up appointments you made. If you do not go to an appointment, you could develop lasting (chronic) problems with pain. If there is any problem keeping an appointment, you must call to reschedule.  SEEK MEDICAL CARE IF:   You think you are having problems from the medicine you are taking. Read your medicine instructions carefully.   Your chest pain does not go away, even after treatment.   You develop a rash with blisters on your chest.  SEEK IMMEDIATE MEDICAL CARE IF:   You have increased chest pain or pain that spreads to your arm, neck, jaw, back, or abdomen.   You develop shortness of breath, an increasing cough, or you are coughing up blood.   You have severe back or abdominal pain, feel nauseous, or vomit.   You develop severe weakness, fainting, or chills.   You have a fever.  THIS IS AN EMERGENCY. Do not wait to see if the pain will go away. Get medical help at once. Call your local emergency services (911 in [place] Do not drive yourself to the hospital. MAKE SURE YOU:   Understand these instructions.     Will watch your condition.   Will get help right away if you are not doing well or get worse.  Document Released: 04/12/2005 Document Revised: 06/22/2011 Document Reviewed: 02/06/2008 [employer] Patient Information 2012 [employer]. 

## 2011-09-20 NOTE — Progress Notes (Signed)
  Subjective:    Donna Pham is a 56 y.o. female who presents for evaluation of chest pain. Onset was 7 days ago. Symptoms have improved since that time. The patient describes the pain as sharp and does not radiate. Patient rates pain as a 5/10 in intensity. Associated symptoms are: chest pain and chest pressure/discomfort. Aggravating factors are: emotional stress. Alleviating factors are: NSAIDs. Patient's cardiac risk factors are: diabetes mellitus, dyslipidemia, hypertension, obesity (BMI >= 30 kg/m2) and sedentary lifestyle. Patient's risk factors for DVT/PE: none. Previous cardiac testing: echocardiogram and electrocardiogram (ECG) and stress test (3years ago).   Pt does admit to dry cough, no nasal congestion,  ? Fever-- she felt hot she didn't take her temp.  + chills.    Pt still has some residual cough.     The following portions of the patient's history were reviewed and updated as appropriate: allergies, current medications, past family history, past medical history, past social history, past surgical history and problem list.  Review of Systems Pertinent items are noted in HPI.    Objective:    BP 128/84  Pulse 81  Temp(Src) 98.3 F (36.8 C) (Oral)  Wt 229 lb 12.8 oz (104.237 kg)  SpO2 97% General appearance: alert, cooperative, appears stated age and no distress Ears: normal TM's and external ear canals both ears Nose: Nares normal. Septum midline. Mucosa normal. No drainage or sinus tenderness. Throat: lips, mucosa, and tongue normal; teeth and gums normal Neck: no adenopathy and thyroid not enlarged, symmetric, no tenderness/mass/nodules Lungs: clear to auscultation bilaterally Heart: S1, S2 normal Extremities: extremities normal, atraumatic, no cyanosis or edema  Cardiographics ECG: no change since previous ECG dated 04/2011  Imaging Chest x-ray: not available for review    Assessment:    Chest pain ? Etiology-- -GERD, anxiety --- cardiac but will r/o               Pt stopped nexium a while ago   Plan:    Patient history and exam consistent with non-cardiac cause of chest pain. Conservative measures indicated. Worsening signs and symptoms discussed and patient verbalized understanding. Follow up with me in 3 months.  --- or sooner prn  Pt to go to ER if pain returns

## 2011-09-21 ENCOUNTER — Ambulatory Visit (INDEPENDENT_AMBULATORY_CARE_PROVIDER_SITE_OTHER)
Admission: RE | Admit: 2011-09-21 | Discharge: 2011-09-21 | Disposition: A | Discharge status: Home / Self Care | Payer: Medicare Other | Source: Ambulatory Visit | Attending: Family Medicine | Admitting: Family Medicine

## 2011-09-21 DIAGNOSIS — R079 Chest pain, unspecified: Secondary | ICD-10-CM

## 2011-10-11 ENCOUNTER — Encounter: Payer: Self-pay | Admitting: Family Medicine

## 2011-10-11 ENCOUNTER — Ambulatory Visit (INDEPENDENT_AMBULATORY_CARE_PROVIDER_SITE_OTHER): Payer: Medicare Other | Admitting: Family Medicine

## 2011-10-11 VITALS — BP 120/80 | HR 83 | Temp 98.4°F | Wt 234.0 lb

## 2011-10-11 DIAGNOSIS — J329 Chronic sinusitis, unspecified: Secondary | ICD-10-CM

## 2011-10-11 DIAGNOSIS — E785 Hyperlipidemia, unspecified: Secondary | ICD-10-CM

## 2011-10-11 DIAGNOSIS — E119 Type 2 diabetes mellitus without complications: Secondary | ICD-10-CM

## 2011-10-11 MED ORDER — SAXAGLIPTIN HCL 5 MG PO TABS: ORAL_TABLET | ORAL | Status: DC

## 2011-10-11 MED ORDER — AZELASTINE-FLUTICASONE 137-50 MCG/ACT NA SUSP: 1.0000 | Freq: Two times a day (BID) | NASAL | Status: DC

## 2011-10-11 MED ORDER — AMOXICILLIN-POT CLAVULANATE 875-125 MG PO TABS: 1.0000 | ORAL_TABLET | Freq: Two times a day (BID) | ORAL | Status: AC

## 2011-10-11 MED ORDER — GUAIFENESIN-CODEINE 100-10 MG/5ML PO SYRP: ORAL_SOLUTION | ORAL | Status: DC

## 2011-10-11 MED ORDER — METFORMIN HCL ER 500 MG PO TB24: ORAL_TABLET | ORAL | Status: DC

## 2011-10-11 MED ORDER — ROSUVASTATIN CALCIUM 20 MG PO TABS: 20.0000 mg | ORAL_TABLET | Freq: Every day | ORAL | Status: DC

## 2011-10-11 NOTE — Progress Notes (Signed)
  Subjective:     Donna Pham is a 56 y.o. female who presents for evaluation of symptoms of a URI. Symptoms include congestion, facial pain, fever low grade, nasal congestion, productive cough with  green colored sputum, purulent nasal discharge, sinus pressure and sore throat. Onset of symptoms was 6 days ago, and has been gradually worsening since that time. Treatment to date: none.  The following portions of the patient's history were reviewed and updated as appropriate: allergies, current medications, past family history, past medical history, past social history, past surgical history and problem list.  Review of Systems Pertinent items are noted in HPI.   Objective:    BP 120/80  Pulse 83  Temp(Src) 98.4 F (36.9 C) (Oral)  Wt 234 lb (106.142 kg)  SpO2 97% General appearance: alert, cooperative, appears stated age and no distress Ears: normal TM's and external ear canals both ears Nose: green discharge, moderate congestion, turbinates red, swollen, sinus tenderness bilateral Throat: lips, mucosa, and tongue normal; teeth and gums normal Neck: mild anterior cervical adenopathy, supple, symmetrical, trachea midline and thyroid not enlarged, symmetric, no tenderness/mass/nodules Lungs: clear to auscultation bilaterally Heart: S1, S2 normal   Assessment:    sinusitis   Plan:    Discussed diagnosis and treatment of URI. Nasal saline spray for congestion. Augmentin per orders. Nasal steroids per orders. Follow up as needed.

## 2011-10-29 ENCOUNTER — Encounter (HOSPITAL_COMMUNITY): Payer: Self-pay | Admitting: *Deleted

## 2011-10-29 ENCOUNTER — Emergency Department (HOSPITAL_COMMUNITY)
Admission: EM | Admit: 2011-10-29 | Discharge: 2011-10-29 | Disposition: A | Discharge status: Home / Self Care | Payer: Medicare Other | Attending: Emergency Medicine | Admitting: Emergency Medicine

## 2011-10-29 DIAGNOSIS — Z7982 Long term (current) use of aspirin: Secondary | ICD-10-CM | POA: Insufficient documentation

## 2011-10-29 DIAGNOSIS — T148 Other injury of unspecified body region: Secondary | ICD-10-CM | POA: Insufficient documentation

## 2011-10-29 DIAGNOSIS — E785 Hyperlipidemia, unspecified: Secondary | ICD-10-CM | POA: Insufficient documentation

## 2011-10-29 DIAGNOSIS — W57XXXA Bitten or stung by nonvenomous insect and other nonvenomous arthropods, initial encounter: Secondary | ICD-10-CM | POA: Insufficient documentation

## 2011-10-29 DIAGNOSIS — M129 Arthropathy, unspecified: Secondary | ICD-10-CM | POA: Insufficient documentation

## 2011-10-29 DIAGNOSIS — K219 Gastro-esophageal reflux disease without esophagitis: Secondary | ICD-10-CM | POA: Insufficient documentation

## 2011-10-29 DIAGNOSIS — E119 Type 2 diabetes mellitus without complications: Secondary | ICD-10-CM | POA: Insufficient documentation

## 2011-10-29 DIAGNOSIS — Y93H2 Activity, gardening and landscaping: Secondary | ICD-10-CM | POA: Insufficient documentation

## 2011-10-29 DIAGNOSIS — Y998 Other external cause status: Secondary | ICD-10-CM | POA: Insufficient documentation

## 2011-10-29 DIAGNOSIS — I10 Essential (primary) hypertension: Secondary | ICD-10-CM | POA: Insufficient documentation

## 2011-10-29 MED ORDER — DIPHENHYDRAMINE HCL 25 MG PO CAPS
ORAL_CAPSULE | ORAL | Status: AC
  Administered 2011-10-29: 25 mg
  Filled 2011-10-29: qty 1

## 2011-10-29 MED ORDER — CEPHALEXIN 500 MG PO CAPS: 500.0000 mg | ORAL_CAPSULE | Freq: Four times a day (QID) | ORAL | Status: AC

## 2011-10-29 MED ORDER — SULFAMETHOXAZOLE-TRIMETHOPRIM 800-160 MG PO TABS: 1.0000 | ORAL_TABLET | Freq: Two times a day (BID) | ORAL | Status: AC

## 2011-10-29 NOTE — ED Notes (Signed)
Last night was working in backyard and was grabbing soil and felt something bite/sting her.  Pt reports increased pain and swelling last night and became worse this AM.  Pt has swelling noted to right hand no redness or streaking.  Pt has tried taking tylenol for pain and used neosporin and ice on hand.  Not area with bite/stinger,no area with pus or drainage.

## 2011-10-29 NOTE — Discharge Instructions (Signed)
Insect Bite Mosquitoes, flies, fleas, bedbugs, and many other insects can bite. Insect bites are different from insect stings. A sting is when venom is injected into the skin. Some insect bites can transmit infectious diseases. SYMPTOMS  Insect bites usually turn red, swell, and itch for 2 to 4 days. They often go away on their own. TREATMENT  Your caregiver may prescribe antibiotic medicines if a bacterial infection develops in the bite. HOME CARE INSTRUCTIONS  Do not scratch the bite area.   Keep the bite area clean and dry. Wash the bite area thoroughly with soap and water.   Put ice or cool compresses on the bite area.   Put ice in a plastic bag.   Place a towel between your skin and the bag.   Leave the ice on for 20 minutes, 4 times a day for the first 2 to 3 days, or as directed.   You may apply a baking soda paste, cortisone cream, or calamine lotion to the bite area as directed by your caregiver. This can help reduce itching and swelling.   Only take over-the-counter or prescription medicines as directed by your caregiver.   If you are given antibiotics, take them as directed. Finish them even if you start to feel better.  You may need a tetanus shot if:  You cannot remember when you had your last tetanus shot.   You have never had a tetanus shot.   The injury broke your skin.  If you get a tetanus shot, your arm may swell, get red, and feel warm to the touch. This is common and not a problem. If you need a tetanus shot and you choose not to have one, there is a rare chance of getting tetanus. Sickness from tetanus can be serious. SEEK IMMEDIATE MEDICAL CARE IF:   You have increased pain, redness, or swelling in the bite area.   You see a red line on the skin coming from the bite.   You have a fever.   You have joint pain.   You have a headache or neck pain.   You have unusual weakness.   You have a rash.   You have chest pain or shortness of breath.   You  have abdominal pain, nausea, or vomiting.   You feel unusually tired or sleepy.  MAKE SURE YOU:   Understand these instructions.   Will watch your condition.   Will get help right away if you are not doing well or get worse.  Document Released: 08/10/2004 Document Revised: 06/22/2011 Document Reviewed: 02/01/2011 Exit[employer] Patient Information 2012 [employer]. 

## 2011-10-29 NOTE — ED Provider Notes (Signed)
History     CSN: [id number]  Arrival date & time 10/29/11  1138   First MD Initiated Contact with Patient 10/29/11 1513      No chief complaint on file.   (Consider location/radiation/quality/duration/timing/severity/associated sxs/prior treatment) HPI  Pt presents to the ED with complaints of but bite yesterday while gardening. She states that she took Ibuprofen last night which helped. She also states that the swelling has gone down since yesterday and that it is mildly painful. No fevers, chills, redness, weakness, streaking, LOC.  Past Medical History  Diagnosis Date  . Depression   . Diabetes mellitus   . GERD (gastroesophageal reflux disease)   . Hyperlipidemia   . Hypertension   . CTS (carpal tunnel syndrome)     6/15-19/09 RLL PNA with sepsis high LFTs (ALT 61)  . Arthritis   . Right upper quadrant abdominal pain     History reviewed. No pertinent past surgical history.  Family History  Problem Relation Age of Onset  . Arthritis Father   . Cancer Father   . Diabetes Mother     History  Substance Use Topics  . Smoking status: Never Smoker   . Smokeless tobacco: Never Used  . Alcohol Use: No    OB History    Grav Para Term Preterm Abortions TAB SAB Ect Mult Living                  Review of Systems  All other systems reviewed and are negative.    Allergies  Review of patient's allergies indicates no known allergies.  Home Medications   Current Outpatient Rx  Name Route Sig Dispense Refill  . ALPRAZOLAM 0.25 MG PO TABS Oral Take 0.25 mg by mouth 3 (three) times daily as needed. anxiety    . ASPIRIN 81 MG PO TABS Oral Take 81 mg by mouth daily.      . AZELASTINE-FLUTICASONE 137-50 MCG/ACT NA SUSP Nasal Place 1 spray into the nose 2 (two) times daily.    Marland Kitchen ESOMEPRAZOLE MAGNESIUM 40 MG PO CPDR Oral Take 1 capsule (40 mg total) by mouth daily before breakfast. 90 capsule 3  . GABAPENTIN 300 MG PO CAPS Oral Take 1 capsule (300 mg total) by mouth 3  (three) times daily. 90 capsule 3  . GLYBURIDE 1.25 MG PO TABS Oral Take 2.5 mg by mouth 2 (two) times daily with a meal.    . PIOGLITAZONE HCL 45 MG PO TABS Oral Take 45 mg by mouth daily.    Marland Kitchen PRAMOXINE-HC 1-1 % EX FOAM Topical Apply 1 application topically 3 (three) times daily. 10 g 2  . ROSUVASTATIN CALCIUM 20 MG PO TABS Oral Take 1 tablet (20 mg total) by mouth daily. 90 tablet 1  . SAXAGLIPTIN HCL 5 MG PO TABS  1 po qd 90 tablet 0  . SERTRALINE HCL 100 MG PO TABS Oral Take 2 tablets (200 mg total) by mouth daily. 180 tablet 2  . CEPHALEXIN 500 MG PO CAPS Oral Take 1 capsule (500 mg total) by mouth 4 (four) times daily. 28 capsule 0  . METFORMIN HCL ER 500 MG PO TB24  1 po qd at 2pm 90 tablet 0  . SULFAMETHOXAZOLE-TRIMETHOPRIM 800-160 MG PO TABS Oral Take 1 tablet by mouth every 12 (twelve) hours. 10 tablet 0    BP 122/83  Pulse 71  Resp 18  SpO2 98%  Physical Exam  Nursing note and vitals reviewed. Constitutional: She appears well-developed and well-nourished. No distress.  HENT:  Head: Normocephalic and atraumatic.  Eyes: Pupils are equal, round, and reactive to light.  Neck: Normal range of motion. Neck supple.  Cardiovascular: Normal rate and regular rhythm.   Pulmonary/Chest: Effort normal.  Abdominal: Soft.  Musculoskeletal:       Right hand: She exhibits tenderness and swelling. She exhibits normal range of motion, no bony tenderness, normal two-point discrimination, normal capillary refill, no deformity and no laceration. normal sensation noted. Normal strength noted.       Hands: Neurological: She is alert.  Skin: Skin is warm and dry.    ED Course  Procedures (including critical care time)  Labs Reviewed - No data to display No results found.   1. Insect bite       MDM  Pt has tenderness to palpation but no tenderness during ROM. NO abscess formation, no streaking, no erythema or inudration. Pt given abx, she is diabetic, and told to take Benadryl. She  is to have a wound recheck with her PCP this week or return to the ED sooner if complications.  Pt has been advised of the symptoms that warrant their return to the ED. Patient has voiced understanding and has agreed to follow-up with the PCP or specialist.         Dorthula Matas, PA 10/29/11 1525

## 2011-11-03 NOTE — ED Provider Notes (Signed)
Medical screening examination/treatment/procedure(s) were performed by non-physician practitioner and as supervising physician I was immediately available for consultation/collaboration.  Aeson Sawyers, MD 11/03/11 0939 

## 2011-11-22 ENCOUNTER — Encounter: Payer: Self-pay | Admitting: Endocrinology

## 2011-11-22 ENCOUNTER — Ambulatory Visit (INDEPENDENT_AMBULATORY_CARE_PROVIDER_SITE_OTHER): Payer: Medicare Other | Admitting: Endocrinology

## 2011-11-22 VITALS — BP 110/72 | HR 77 | Temp 97.7°F | Ht 67.0 in | Wt 232.0 lb

## 2011-11-22 DIAGNOSIS — E119 Type 2 diabetes mellitus without complications: Secondary | ICD-10-CM

## 2011-11-22 MED ORDER — BROMOCRIPTINE MESYLATE 2.5 MG PO TABS: 2.5000 mg | ORAL_TABLET | Freq: Every day | ORAL | Status: DC

## 2011-11-22 NOTE — Patient Instructions (Addendum)
i have sent a prescription to your pharmacy, to add-on " bromocriptine."  To avoid side-effects of nausea and dizziness, take at bedtime, and take just 1/2 pill the first week. check your blood sugar 1 time a day.  vary the time of day when you check, between before the 3 meals, and at bedtime.  also check if you have symptoms of your blood sugar being too high or too low.  please keep a record of the readings and bring it to your next appointment here.  please call us sooner if you are having low blood sugar episodes, or if it stays over 200. Reduce glyburide to just in the morning. Please come back for a follow-up appointment for 1 month.

## 2011-11-22 NOTE — Progress Notes (Signed)
Subjective:    Patient ID: Donna Pham, female    DOB: 06/23/1956, 56 y.o.   MRN: [id number]  HPI Pt returns for f/u of type 2 DM (dx'ed 2003).  no cbg record, but states cbg's vary from 63-280.  It is in general higher as the day goes on.   Past Medical History  Diagnosis Date  . Depression   . Diabetes mellitus   . GERD (gastroesophageal reflux disease)   . Hyperlipidemia   . Hypertension   . CTS (carpal tunnel syndrome)     6/15-19/09 RLL PNA with sepsis high LFTs (ALT 61)  . Arthritis   . Right upper quadrant abdominal pain     No past surgical history on file.  History   Social History  . Marital Status: Married    Spouse Name: N/A    Number of Children: N/A  . Years of Education: N/A   Occupational History  . Retired    Social History Main Topics  . Smoking status: Never Smoker   . Smokeless tobacco: Never Used  . Alcohol Use: No  . Drug Use: No  . Sexually Active: Not on file   Other Topics Concern  . Not on file   Social History Narrative   No diet [place]    Current Outpatient Prescriptions on File Prior to Visit  Medication Sig Dispense Refill  . ALPRAZolam (XANAX) 0.25 MG tablet Take 0.25 mg by mouth 3 (three) times daily as needed. anxiety      . aspirin 81 MG tablet Take 81 mg by mouth daily.        . Azelastine-Fluticasone (DYMISTA) 137-50 MCG/ACT SUSP Place 1 spray into the nose 2 (two) times daily.      Marland Kitchen esomeprazole (NEXIUM) 40 MG capsule Take 1 capsule (40 mg total) by mouth daily before breakfast.  90 capsule  3  . gabapentin (NEURONTIN) 300 MG capsule Take 1 capsule (300 mg total) by mouth 3 (three) times daily.  90 capsule  3  . glyBURIDE (DIABETA) 1.25 MG tablet Take 2.5 mg by mouth daily with breakfast.       . metFORMIN (GLUCOPHAGE-XR) 500 MG 24 hr tablet 1 po qd at 2pm  90 tablet  0  . pioglitazone (ACTOS) 45 MG tablet Take 45 mg by mouth daily.      . pramoxine-hydrocortisone 1-1 % foam Apply 1  application topically 3 (three) times daily.  10 g  2  . rosuvastatin (CRESTOR) 20 MG tablet Take 1 tablet (20 mg total) by mouth daily.  90 tablet  1  . saxagliptin HCl (ONGLYZA) 5 MG TABS tablet 1 po qd  90 tablet  0  . sertraline (ZOLOFT) 100 MG tablet Take 2 tablets (200 mg total) by mouth daily.  180 tablet  2  . bromocriptine (PARLODEL) 2.5 MG tablet Take 1 tablet (2.5 mg total) by mouth at bedtime.  30 tablet  11    No Known Allergies  Family History  Problem Relation Age of Onset  . Arthritis Father   . Cancer Father   . Diabetes Mother     BP 110/72  Pulse 77  Temp(Src) 97.7 F (36.5 C) (Oral)  Ht 5\' 7"  (1.702 m)  Wt 232 lb (105.235 kg)  BMI 36.34 kg/m2  SpO2 95%  Review of Systems Denies loc    Objective:   Physical Exam Pulses: dorsalis pedis intact bilat.   Feet: no deformity.  no ulcer on the feet.  feet  are of normal color and temp.  no edema Neuro: sensation is intact to touch on the feet    Lab Results  Component Value Date   HGBA1C 9.2* 09/20/2011      Assessment & Plan:  DM.  needs increased rx

## 2011-12-18 ENCOUNTER — Ambulatory Visit (INDEPENDENT_AMBULATORY_CARE_PROVIDER_SITE_OTHER)
Admission: RE | Admit: 2011-12-18 | Discharge: 2011-12-18 | Disposition: A | Discharge status: Home / Self Care | Payer: Medicare Other | Source: Ambulatory Visit | Attending: Family Medicine | Admitting: Family Medicine

## 2011-12-18 ENCOUNTER — Encounter: Payer: Self-pay | Admitting: Family Medicine

## 2011-12-18 ENCOUNTER — Ambulatory Visit (INDEPENDENT_AMBULATORY_CARE_PROVIDER_SITE_OTHER): Payer: Medicare Other | Admitting: Family Medicine

## 2011-12-18 VITALS — BP 112/70 | HR 83 | Temp 98.3°F | Wt 232.6 lb

## 2011-12-18 DIAGNOSIS — IMO0001 Reserved for inherently not codable concepts without codable children: Secondary | ICD-10-CM

## 2011-12-18 DIAGNOSIS — M25572 Pain in left ankle and joints of left foot: Secondary | ICD-10-CM

## 2011-12-18 DIAGNOSIS — E1165 Type 2 diabetes mellitus with hyperglycemia: Secondary | ICD-10-CM

## 2011-12-18 DIAGNOSIS — M25579 Pain in unspecified ankle and joints of unspecified foot: Secondary | ICD-10-CM

## 2011-12-18 DIAGNOSIS — E119 Type 2 diabetes mellitus without complications: Secondary | ICD-10-CM

## 2011-12-18 NOTE — Progress Notes (Signed)
  Subjective:    Donna Pham is a 56 y.o. female who presents with left ankle pain. Onset of the symptoms was 10 days ago. Inciting event: injured while in [employer]. Current symptoms include: ability to bear weight, but with some pain, pain at the lateral aspect of the ankle and swelling. Aggravating factors: direct pressure and walking . Symptoms have gradually improved. Patient has had no prior ankle problems. Evaluation to date: none. Treatment to date: none. The following portions of the patient's history were reviewed and updated as appropriate: allergies, current medications, past family history, past medical history, past social history, past surgical history and problem list.    Objective:    BP 112/70  Pulse 83  Temp(Src) 98.3 F (36.8 C) (Oral)  Wt 232 lb 9.6 oz (105.507 kg)  SpO2 97% Right ankle:   normal  Left ankle:   -+ effusion noted laterally -+ tenderness over the medial malleolus   Imaging: X-ray of the left ankle(s): not available    Assessment:    Ankle sprain    Plan:    Natural history and expected course discussed. Questions answered. Rest, ice, compression, elevation (RICE) therapy. Transport planner distributed. f/u prn

## 2011-12-18 NOTE — Patient Instructions (Signed)
Ankle Sprain An ankle sprain is an injury to the strong, fibrous tissues (ligaments) that hold the bones of your ankle joint together.  CAUSES Ankle sprain usually is caused by a fall or by twisting your ankle. People who participate in sports are more prone to these types of injuries.  SYMPTOMS  Symptoms of ankle sprain include:  Pain in your ankle. The pain may be present at rest or only when you are trying to stand or walk.   Swelling.   Bruising. Bruising may develop immediately or within 1 to 2 days after your injury.   Difficulty standing or walking.  DIAGNOSIS  Your caregiver will ask you details about your injury and perform a physical exam of your ankle to determine if you have an ankle sprain. During the physical exam, your caregiver will press and squeeze specific areas of your foot and ankle. Your caregiver will try to move your ankle in certain ways. An X-ray exam may be done to be sure a bone was not broken or a ligament did not separate from one of the bones in your ankle (avulsion).  TREATMENT  Certain types of braces can help stabilize your ankle. Your caregiver can make a recommendation for this. Your caregiver may recommend the use of medication for pain. If your sprain is severe, your caregiver may refer you to a surgeon who helps to restore function to parts of your skeletal system (orthopedist) or a physical therapist. HOME CARE INSTRUCTIONS  Apply ice to your injury for 1 to 2 days or as directed by your caregiver. Applying ice helps to reduce inflammation and pain.  Put ice in a plastic bag.   Place a towel between your skin and the bag.   Leave the ice on for 15 to 20 minutes at a time, every 2 hours while you are awake.   Take over-the-counter or prescription medicines for pain, discomfort, or fever only as directed by your caregiver.   Keep your injured leg elevated, when possible, to lessen swelling.   If your caregiver recommends crutches, use them as  instructed. Gradually, put weight on the affected ankle. Continue to use crutches or a cane until you can walk without feeling pain in your ankle.   If you have a plaster splint, wear the splint as directed by your caregiver. Do not rest it on anything harder than a pillow the first 24 hours. Do not put weight on it. Do not get it wet. You may take it off to take a shower or bath.   You may have been given an elastic bandage to wear around your ankle to provide support. If the elastic bandage is too tight (you have numbness or tingling in your foot or your foot becomes cold and blue), adjust the bandage to make it comfortable.   If you have an air splint, you may blow more air into it or let air out to make it more comfortable. You may take your splint off at night and before taking a shower or bath.   Wiggle your toes in the splint several times per day if you are able.  SEEK MEDICAL CARE IF:   You have an increase in bruising, swelling, or pain.   Your toes feel cold.   Pain relief is not achieved with medication.  SEEK IMMEDIATE MEDICAL CARE IF: Your toes are numb or blue or you have severe pain. MAKE SURE YOU:   Understand these instructions.   Will watch your condition.     Will get help right away if you are not doing well or get worse.  Document Released: 07/03/2005 Document Revised: 06/22/2011 Document Reviewed: 02/05/2008 ExitCare Patient Information 2012 [employer]. 

## 2011-12-18 NOTE — Assessment & Plan Note (Signed)
con't meds  Pt requesting new endo Check labs

## 2011-12-20 ENCOUNTER — Telehealth: Payer: Self-pay | Admitting: Family Medicine

## 2011-12-20 NOTE — Telephone Encounter (Signed)
Will see when labs come back

## 2011-12-20 NOTE — Telephone Encounter (Signed)
In reference to Endocrinology referral, patient is scheduled to see Dr Everardo All Monday June 10 @ 8:30.  Patient is having labs Friday, patient's husband is questioning if she will need to keep this endo appt is labs are not elevated.  Please advise.

## 2011-12-22 ENCOUNTER — Other Ambulatory Visit (INDEPENDENT_AMBULATORY_CARE_PROVIDER_SITE_OTHER): Payer: Medicare Other

## 2011-12-22 DIAGNOSIS — IMO0001 Reserved for inherently not codable concepts without codable children: Secondary | ICD-10-CM

## 2011-12-22 DIAGNOSIS — E785 Hyperlipidemia, unspecified: Secondary | ICD-10-CM

## 2011-12-22 DIAGNOSIS — E1165 Type 2 diabetes mellitus with hyperglycemia: Secondary | ICD-10-CM

## 2011-12-22 LAB — HEMOGLOBIN A1C: Hgb A1c MFr Bld: 7.3 % — ABNORMAL HIGH (ref 4.6–6.5)

## 2011-12-22 LAB — HEPATIC FUNCTION PANEL
ALT: 14 U/L (ref 0–35)
AST: 14 U/L (ref 0–37)
Albumin: 3.8 g/dL (ref 3.5–5.2)
Alkaline Phosphatase: 59 U/L (ref 39–117)
Bilirubin, Direct: 0 mg/dL (ref 0.0–0.3)
Total Bilirubin: 0.4 mg/dL (ref 0.3–1.2)
Total Protein: 6.8 g/dL (ref 6.0–8.3)

## 2011-12-22 LAB — MICROALBUMIN / CREATININE URINE RATIO
Creatinine,U: 186.7 mg/dL
Microalb Creat Ratio: 0.5 mg/g (ref 0.0–30.0)
Microalb, Ur: 0.9 mg/dL (ref 0.0–1.9)

## 2011-12-22 LAB — BASIC METABOLIC PANEL
BUN: 19 mg/dL (ref 6–23)
CO2: 30 mEq/L (ref 19–32)
Calcium: 9.1 mg/dL (ref 8.4–10.5)
Chloride: 106 mEq/L (ref 96–112)
Creatinine, Ser: 0.7 mg/dL (ref 0.4–1.2)
GFR: 94.56 mL/min (ref >60.00)
Glucose, Bld: 132 mg/dL — ABNORMAL HIGH (ref 70–99)
Potassium: 4.3 mEq/L (ref 3.5–5.1)
Sodium: 139 mEq/L (ref 135–145)

## 2011-12-22 LAB — LIPID PANEL
Cholesterol: 113 mg/dL (ref 0–200)
HDL: 47.1 mg/dL (ref >39.00)
LDL Cholesterol: 49 mg/dL (ref 0–99)
Total CHOL/HDL Ratio: 2
Triglycerides: 86 mg/dL (ref 0.0–149.0)
VLDL: 17.2 mg/dL (ref 0.0–40.0)

## 2011-12-25 ENCOUNTER — Ambulatory Visit: Payer: Medicare Other | Admitting: Endocrinology

## 2011-12-29 MED ORDER — METFORMIN HCL ER 500 MG PO TB24: ORAL_TABLET | ORAL | Status: DC

## 2011-12-29 NOTE — Telephone Encounter (Signed)
I see that patient cancelled her appointment with Dr. Everardo All for endocrinology, was she told this was ok to do in reference to below?

## 2011-12-29 NOTE — Telephone Encounter (Signed)
We will hold off on endo --- we will see how labs are in 3 months

## 2012-01-19 ENCOUNTER — Other Ambulatory Visit: Payer: Self-pay | Admitting: Family Medicine

## 2012-01-26 ENCOUNTER — Ambulatory Visit (INDEPENDENT_AMBULATORY_CARE_PROVIDER_SITE_OTHER): Payer: Medicare Other | Admitting: Family Medicine

## 2012-01-26 ENCOUNTER — Encounter: Payer: Self-pay | Admitting: Family Medicine

## 2012-01-26 VITALS — BP 116/64 | HR 84 | Temp 98.2°F | Wt 234.6 lb

## 2012-01-26 DIAGNOSIS — R350 Frequency of micturition: Secondary | ICD-10-CM

## 2012-01-26 LAB — POCT URINALYSIS DIPSTICK
Bilirubin, UA: NEGATIVE
Glucose, UA: NEGATIVE
Ketones, UA: NEGATIVE
Nitrite, UA: POSITIVE
Protein, UA: NEGATIVE
Spec Grav, UA: >=1.03
Urobilinogen, UA: 0.2
pH, UA: 5

## 2012-01-26 MED ORDER — CIPROFLOXACIN HCL 500 MG PO TABS: 500.0000 mg | ORAL_TABLET | Freq: Two times a day (BID) | ORAL | Status: AC

## 2012-01-26 NOTE — Patient Instructions (Signed)

## 2012-01-26 NOTE — Progress Notes (Signed)
  Subjective:    Donna Pham is a 56 y.o. female who complains of burning with urination, frequency and suprapubic pressure. She has had symptoms for 4 days. Patient also complains of stomach ache. Patient denies back pain, congestion, cough, fever, headache, rhinitis, sorethroat and vaginal discharge. Patient does not have a history of recurrent UTI. Patient does not have a history of pyelonephritis.   The following portions of the patient's history were reviewed and updated as appropriate: allergies, current medications, past family history, past medical history, past social history, past surgical history and problem list.  Review of Systems Pertinent items are noted in HPI.    Objective:    BP 116/64  Pulse 84  Temp 98.2 F (36.8 C) (Oral)  Wt 234 lb 9.6 oz (106.414 kg)  SpO2 97% General appearance: AAOx3   Abdomen: soft , nt  Laboratory:  Urine dipstick: + for leukocyte esterase and pos for nitrites.   Micro exam: not done.    Assessment:    Acute cystitis and UTI     Plan:    Medications: ciprofloxacin. Maintain adequate hydration. Follow up if symptoms not improving, and as needed.

## 2012-01-28 ENCOUNTER — Encounter: Payer: Self-pay | Admitting: Family Medicine

## 2012-01-29 LAB — URINE CULTURE: Colony Count: >=100000

## 2012-03-19 ENCOUNTER — Telehealth: Payer: Self-pay | Admitting: Family Medicine

## 2012-03-19 ENCOUNTER — Ambulatory Visit (INDEPENDENT_AMBULATORY_CARE_PROVIDER_SITE_OTHER): Payer: Medicare Other | Admitting: Family Medicine

## 2012-03-19 ENCOUNTER — Encounter: Payer: Self-pay | Admitting: Family Medicine

## 2012-03-19 VITALS — BP 118/74 | HR 74 | Temp 98.4°F | Wt 239.2 lb

## 2012-03-19 DIAGNOSIS — L255 Unspecified contact dermatitis due to plants, except food: Secondary | ICD-10-CM

## 2012-03-19 DIAGNOSIS — R300 Dysuria: Secondary | ICD-10-CM

## 2012-03-19 DIAGNOSIS — L247 Irritant contact dermatitis due to plants, except food: Secondary | ICD-10-CM

## 2012-03-19 LAB — POCT URINALYSIS DIPSTICK
Bilirubin, UA: NEGATIVE
Blood, UA: NEGATIVE
Glucose, UA: NEGATIVE
Ketones, UA: NEGATIVE
Leukocytes, UA: NEGATIVE
Nitrite, UA: NEGATIVE
Protein, UA: NEGATIVE
Spec Grav, UA: 1.01
Urobilinogen, UA: 0.2
pH, UA: 7

## 2012-03-19 MED ORDER — MOMETASONE FUROATE 0.1 % EX CREA: TOPICAL_CREAM | Freq: Every day | CUTANEOUS | Status: AC

## 2012-03-19 MED ORDER — MOMETASONE FUROATE 0.1 % EX CREA: TOPICAL_CREAM | Freq: Every day | CUTANEOUS | Status: DC

## 2012-03-19 NOTE — Telephone Encounter (Signed)
Rx has been re-faxed.   KP 

## 2012-03-19 NOTE — Progress Notes (Signed)
  Subjective:     Donna Pham is a 56 y.o. female who complains of a rash. Symptoms began 4 days ago. Patient describes the rash as erythematous, vesicles. Characteristics of rash and associated history: Similar rash in the past? no, Is rash pruritic?  yes, Is rash painful?  no, Alleviating factors? no, Aggravating factors? no, Any associated:no, Similar problem in household members? no, Skin exposure to potential irritants at home or work or from [employer], Personal hx of allergies, asthma or hay fever? no, Recent hiking?no, Recent travel? no, Recent exposure to animals? no, Related to temperature changes? no, History of previous sunburn? no. Patient's previous dermatologic history includes none. Family history of derm problems: na. Medications currently using: antifungal. Environmental exposures or allergies: poison ivy  The following portions of the patient's history were reviewed and updated as appropriate: allergies, current medications, past family history, past medical history, past social history, past surgical history and problem list.   Review of Systems Pertinent items are noted in HPI.    Objective:    BP 118/74  Pulse 74  Temp 98.4 F (36.9 C) (Oral)  Wt 239 lb 3.2 oz (108.5 kg)  SpO2 96% Physical Exam  General:  alert, cooperative and appears stated age  HEENT:  extra ocular movement intact  Lymph Nodes:  Cervical, supraclavicular, and axillary nodes normal.  Lungs:  clear to auscultation bilaterally  Heart:  S1, S2 normal  Extremities:  extremities normal, atraumatic, no cyanosis or edema  Skin:   Rash located on the abdomen, arms.   Shape:   raised   Consistency:   nontender  Color:   red  Type: vesicle(s)- arm(s) right, abdomen  Size: na    The remainder of the skin exam:  normal  Laboratory: na      Assessment:    contact dermatitis: plants poison ivy   dysuria-- UA normal , culture pending Plan:    1.  steroids: elocon cream 2.  Written patient  instruction given. 3. Follow up as needed for acute illness

## 2012-03-19 NOTE — Telephone Encounter (Signed)
The pt called and is hoping to get an update on her rash cream.  She states the pharmacy does not have the rx from [facility].  Thanks!

## 2012-03-19 NOTE — Patient Instructions (Signed)
Poison Ivy Poison ivy is a inflammation of the skin (contact dermatitis) caused by touching the allergens on the leaves of the ivy plant following previous exposure to the plant. The rash usually appears 48 hours after exposure. The rash is usually bumps (papules) or blisters (vesicles) in a linear pattern. Depending on your own sensitivity, the rash may simply cause redness and itching, or it may also progress to blisters which may break open. These must be well cared for to prevent secondary bacterial (germ) infection, followed by scarring. Keep any open areas dry, clean, dressed, and covered with an antibacterial ointment if needed. The eyes may also get puffy. The puffiness is worst in the morning and gets better as the day progresses. This dermatitis usually heals without scarring, within 2 to 3 weeks without treatment. HOME CARE INSTRUCTIONS  Thoroughly wash with soap and water as soon as you have been exposed to poison ivy. You have about one half hour to remove the plant resin before it will cause the rash. This washing will destroy the oil or antigen on the skin that is causing, or will cause, the rash. Be sure to wash under your fingernails as any plant resin there will continue to spread the rash. Do not rub skin vigorously when washing affected area. Poison ivy cannot spread if no oil from the plant remains on your body. A rash that has progressed to weeping sores will not spread the rash unless you have not washed thoroughly. It is also important to wash any clothes you have been wearing as these may carry active allergens. The rash will return if you wear the unwashed clothing, even several days later. Avoidance of the plant in the future is the best measure. Poison ivy plant can be recognized by the number of leaves. Generally, poison ivy has three leaves with flowering branches on a single stem. Diphenhydramine may be purchased over the counter and used as needed for itching. Do not drive with  this medication if it makes you drowsy.Ask your caregiver about medication for children. SEEK MEDICAL CARE IF:  Open sores develop.   Redness spreads beyond area of rash.   You notice purulent (pus-like) discharge.   You have increased pain.   Other signs of infection develop (such as fever).  Document Released: 06/30/2000 Document Revised: 06/22/2011 Document Reviewed: 05/19/2009 ExitCare Patient Information 2012 [employer]. 

## 2012-03-20 ENCOUNTER — Telehealth: Payer: Self-pay | Admitting: *Deleted

## 2012-03-20 NOTE — Telephone Encounter (Signed)
It can be nurse visit

## 2012-03-20 NOTE — Telephone Encounter (Signed)
Will she need a nurse visit or will she need to see you?

## 2012-03-20 NOTE — Telephone Encounter (Signed)
We can do depo medrol and pred taper but she will need to be instructed on how to take regular insulin --sliding scale

## 2012-03-20 NOTE — Telephone Encounter (Signed)
Pt seen on yesterday for poison ivy. Pt has tried the cream but states that it has not help with the itching or redness. Pt feels that it has gotten worse since using the cream.Please advise

## 2012-03-21 ENCOUNTER — Ambulatory Visit (INDEPENDENT_AMBULATORY_CARE_PROVIDER_SITE_OTHER): Payer: Medicare Other

## 2012-03-21 DIAGNOSIS — R21 Rash and other nonspecific skin eruption: Secondary | ICD-10-CM

## 2012-03-21 LAB — URINE CULTURE
Colony Count: NO GROWTH
Organism ID, Bacteria: NO GROWTH

## 2012-03-21 MED ORDER — METHYLPREDNISOLONE ACETATE 80 MG/ML IJ SUSP: 80.0000 mg | Freq: Once | INTRAMUSCULAR | Status: DC

## 2012-03-21 NOTE — Telephone Encounter (Signed)
Copy and pasted sliding scale to the AVS and demonstrated Insulin injection.     KP

## 2012-03-21 NOTE — Telephone Encounter (Signed)
Please advise on sliding scale directions--    KP

## 2012-03-21 NOTE — Telephone Encounter (Signed)
Pt seen in office today.

## 2012-03-21 NOTE — Telephone Encounter (Signed)
Reg insulin  200-250  2 u                      251-300  4 u                    301-350    6 u                       351-400  8 u                        >400  10 u call dr on call

## 2012-03-21 NOTE — Patient Instructions (Addendum)
Use insulin only if your blood sugars are elevated  Directions: Reg insulin 200-250--- 2 u of insulin                    251-300--- 4 u of insulin         301-350--- 6 u of insulin         351-400--- 8 u of insulin               >   400 10 u call dr on call  Reviewed, demonstrated and discussed the sliding scale insulin with the patient and her husband and they both voiced understanding.      KP

## 2012-04-14 ENCOUNTER — Other Ambulatory Visit: Payer: Self-pay | Admitting: Endocrinology

## 2012-04-26 ENCOUNTER — Other Ambulatory Visit: Payer: Self-pay | Admitting: Family Medicine

## 2012-04-26 ENCOUNTER — Other Ambulatory Visit: Payer: Self-pay | Admitting: Endocrinology

## 2012-04-29 ENCOUNTER — Other Ambulatory Visit: Payer: Self-pay | Admitting: General Practice

## 2012-04-29 NOTE — Telephone Encounter (Signed)
Please refill x 1 Ov is due  

## 2012-04-29 NOTE — Telephone Encounter (Signed)
Refill request for DIABETA. Please advise as to what sig should be? Snapshot states two different things.

## 2012-05-24 ENCOUNTER — Other Ambulatory Visit: Payer: Self-pay | Admitting: Family Medicine

## 2012-06-04 ENCOUNTER — Other Ambulatory Visit (INDEPENDENT_AMBULATORY_CARE_PROVIDER_SITE_OTHER): Payer: Medicare Other

## 2012-06-04 DIAGNOSIS — E785 Hyperlipidemia, unspecified: Secondary | ICD-10-CM

## 2012-06-04 DIAGNOSIS — E119 Type 2 diabetes mellitus without complications: Secondary | ICD-10-CM

## 2012-06-04 LAB — LIPID PANEL
Cholesterol: 141 mg/dL (ref 0–200)
HDL: 48.6 mg/dL (ref >39.00)
LDL Cholesterol: 77 mg/dL (ref 0–99)
Total CHOL/HDL Ratio: 3
Triglycerides: 79 mg/dL (ref 0.0–149.0)
VLDL: 15.8 mg/dL (ref 0.0–40.0)

## 2012-06-04 LAB — BASIC METABOLIC PANEL
BUN: 16 mg/dL (ref 6–23)
CO2: 29 mEq/L (ref 19–32)
Calcium: 8.9 mg/dL (ref 8.4–10.5)
Chloride: 101 mEq/L (ref 96–112)
Creatinine, Ser: 0.7 mg/dL (ref 0.4–1.2)
GFR: 86.99 mL/min (ref >60.00)
Glucose, Bld: 124 mg/dL — ABNORMAL HIGH (ref 70–99)
Potassium: 4.4 mEq/L (ref 3.5–5.1)
Sodium: 136 mEq/L (ref 135–145)

## 2012-06-04 LAB — HEPATIC FUNCTION PANEL
ALT: 16 U/L (ref 0–35)
AST: 16 U/L (ref 0–37)
Albumin: 3.8 g/dL (ref 3.5–5.2)
Alkaline Phosphatase: 58 U/L (ref 39–117)
Bilirubin, Direct: 0.1 mg/dL (ref 0.0–0.3)
Total Bilirubin: 0.6 mg/dL (ref 0.3–1.2)
Total Protein: 7 g/dL (ref 6.0–8.3)

## 2012-06-04 LAB — HEMOGLOBIN A1C: Hgb A1c MFr Bld: 6.9 % — ABNORMAL HIGH (ref 4.6–6.5)

## 2012-06-06 ENCOUNTER — Other Ambulatory Visit: Payer: Medicare Other

## 2012-06-28 ENCOUNTER — Encounter: Payer: Self-pay | Admitting: Family Medicine

## 2012-06-28 ENCOUNTER — Ambulatory Visit (INDEPENDENT_AMBULATORY_CARE_PROVIDER_SITE_OTHER): Payer: Medicare Other | Admitting: Family Medicine

## 2012-06-28 VITALS — BP 118/70 | HR 82 | Temp 98.2°F | Wt 243.6 lb

## 2012-06-28 DIAGNOSIS — J020 Streptococcal pharyngitis: Secondary | ICD-10-CM

## 2012-06-28 DIAGNOSIS — J029 Acute pharyngitis, unspecified: Secondary | ICD-10-CM

## 2012-06-28 LAB — POCT RAPID STREP A (OFFICE): Rapid Strep A Screen: POSITIVE — AB

## 2012-06-28 MED ORDER — PENICILLIN G BENZATHINE 1200000 UNIT/2ML IM SUSP
1.2000 10*6.[IU] | Freq: Once | INTRAMUSCULAR | Status: AC
  Administered 2012-06-28: 1.2 10*6.[IU] via INTRAMUSCULAR

## 2012-06-28 MED ORDER — PENICILLIN G BENZATHINE & PROC 1200000 UNIT/2ML IM SUSP: 1.2000 10*6.[IU] | Freq: Once | INTRAMUSCULAR | Status: DC

## 2012-06-28 NOTE — Progress Notes (Signed)
  Subjective:     Donna Pham is a 56 y.o. female who presents for evaluation of sore throat. Associated symptoms include sore throat and swollen glands. Onset of symptoms was a few days ago, and have been gradually worsening since that time. She is drinking plenty of fluids. She has had a recent close exposure to someone with proven streptococcal pharyngitis.  The following portions of the patient's history were reviewed and updated as appropriate: allergies, current medications, past family history, past medical history, past social history, past surgical history and problem list.  Review of Systems Pertinent items are noted in HPI.    Objective:    BP 118/70  Pulse 82  Temp 98.2 F (36.8 C) (Oral)  Wt 243 lb 9.6 oz (110.496 kg)  SpO2 95% General appearance: alert, cooperative, appears stated age and no distress Ears: normal TM's and external ear canals both ears Nose: Nares normal. Septum midline. Mucosa normal. No drainage or sinus tenderness. Throat: abnormal findings: exudates present and marked oropharyngeal erythema Neck: mild anterior cervical adenopathy, no JVD, supple, symmetrical, trachea midline and thyroid not enlarged, symmetric, no tenderness/mass/nodules Lungs: clear to auscultation bilaterally Heart: S1, S2 normal  Laboratory Strep test done. Results:positive.    Assessment:    Acute pharyngitis, likely  Strep throat.    Plan:    Patient placed on antibiotics. Use of OTC analgesics recommended as well as salt water gargles. Follow up as needed.

## 2012-06-28 NOTE — Patient Instructions (Signed)

## 2012-07-22 ENCOUNTER — Other Ambulatory Visit: Payer: Self-pay | Admitting: Family Medicine

## 2012-07-23 ENCOUNTER — Ambulatory Visit (INDEPENDENT_AMBULATORY_CARE_PROVIDER_SITE_OTHER): Payer: Medicare Other | Admitting: Family Medicine

## 2012-07-23 ENCOUNTER — Ambulatory Visit: Payer: Medicare Other | Admitting: Family Medicine

## 2012-07-23 ENCOUNTER — Encounter: Payer: Self-pay | Admitting: Family Medicine

## 2012-07-23 VITALS — BP 118/70 | HR 82 | Temp 98.4°F | Wt 244.4 lb

## 2012-07-23 DIAGNOSIS — J329 Chronic sinusitis, unspecified: Secondary | ICD-10-CM

## 2012-07-23 DIAGNOSIS — R509 Fever, unspecified: Secondary | ICD-10-CM

## 2012-07-23 LAB — POCT INFLUENZA A/B
Influenza A, POC: NEGATIVE
Influenza B, POC: NEGATIVE

## 2012-07-23 MED ORDER — CEFUROXIME AXETIL 500 MG PO TABS: 500.0000 mg | ORAL_TABLET | Freq: Two times a day (BID) | ORAL | Status: AC

## 2012-07-23 NOTE — Progress Notes (Signed)
  Subjective:     Donna Pham is a 57 y.o. female who presents for evaluation of sinus pain. Symptoms include: congestion, cough, facial pain, headaches, nasal congestion, purulent rhinorrhea and sinus pressure. Onset of symptoms was 4 days ago. Symptoms have been gradually worsening since that time. Past history is significant for pneumonia. Patient is a non-smoker.  The following portions of the patient's history were reviewed and updated as appropriate: allergies, current medications, past family history, past medical history, past social history, past surgical history and problem list.  Review of Systems Pertinent items are noted in HPI.   Objective:    BP 118/70  Pulse 82  Temp 98.4 F (36.9 C) (Oral)  Wt 244 lb 6.4 oz (110.859 kg)  SpO2 97% General appearance: alert, cooperative, appears stated age and mild distress Ears: normal TM's and external ear canals both ears Nose: green discharge, moderate congestion, turbinates red, swollen, sinus tenderness bilateral Throat: abnormal findings: mild oropharyngeal erythema Neck: mild anterior cervical adenopathy, supple, symmetrical, trachea midline and thyroid not enlarged, symmetric, no tenderness/mass/nodules Lungs: clear to auscultation bilaterally Heart: S1, S2 normal    Assessment:    Acute bacterial sinusitis.    Plan:    Nasal steroids per medication orders. Antihistamines per medication orders. Ceftin per medication orders. f/u prn

## 2012-07-23 NOTE — Addendum Note (Signed)
Addended by: Arnette Norris on: 07/23/2012 02:10 PM   Modules accepted: Orders

## 2012-07-23 NOTE — Patient Instructions (Signed)

## 2012-08-12 ENCOUNTER — Encounter: Payer: Self-pay | Admitting: Family Medicine

## 2012-08-12 ENCOUNTER — Ambulatory Visit (INDEPENDENT_AMBULATORY_CARE_PROVIDER_SITE_OTHER): Payer: Medicare Other | Admitting: Family Medicine

## 2012-08-12 VITALS — BP 114/76 | HR 101 | Temp 98.3°F | Wt 243.2 lb

## 2012-08-12 DIAGNOSIS — R509 Fever, unspecified: Secondary | ICD-10-CM

## 2012-08-12 DIAGNOSIS — J111 Influenza due to unidentified influenza virus with other respiratory manifestations: Secondary | ICD-10-CM

## 2012-08-12 DIAGNOSIS — J101 Influenza due to other identified influenza virus with other respiratory manifestations: Secondary | ICD-10-CM

## 2012-08-12 LAB — POCT INFLUENZA A/B
Influenza A, POC: POSITIVE
Influenza B, POC: NEGATIVE

## 2012-08-12 MED ORDER — OSELTAMIVIR PHOSPHATE 75 MG PO CAPS: 75.0000 mg | ORAL_CAPSULE | Freq: Two times a day (BID) | ORAL | Status: DC

## 2012-08-12 MED ORDER — AZITHROMYCIN 250 MG PO TABS: ORAL_TABLET | ORAL | Status: DC

## 2012-08-12 NOTE — Patient Instructions (Addendum)

## 2012-08-12 NOTE — Progress Notes (Signed)
  Subjective:     Donna Pham is a 57 y.o. female who presents for evaluation of symptoms of a URI. Symptoms include achiness, congestion, fever --felt hot and had chills, headache described as - and non productive cough. Onset of symptoms was 4 days ago, and has been gradually worsening since that time. Treatment to date: tylenol, nsaids.  The following portions of the patient's history were reviewed and updated as appropriate: allergies, current medications, past family history, past medical history, past social history, past surgical history and problem list.  Review of Systems Pertinent items are noted in HPI.   Objective:    BP 114/76  Pulse 101  Temp 98.3 F (36.8 C) (Oral)  Wt 243 lb 3.2 oz (110.315 kg)  SpO2 95% General appearance: alert, cooperative, appears stated age and mild distress Ears: normal TM's and external ear canals both ears Nose: Nares normal. Septum midline. Mucosa normal. No drainage or sinus tenderness. Throat: lips, mucosa, and tongue normal; teeth and gums normal Neck: no adenopathy, supple, symmetrical, trachea midline and thyroid not enlarged, symmetric, no tenderness/mass/nodules Lungs: wheezes bilaterally Heart: S1, S2 normal   Assessment:    influenza and viral upper respiratory illness   Plan:    Suggested symptomatic OTC remedies. Nasal saline spray for congestion. Follow up as needed. tamiflu

## 2012-08-15 ENCOUNTER — Encounter: Payer: Self-pay | Admitting: Lab

## 2012-08-16 ENCOUNTER — Ambulatory Visit (INDEPENDENT_AMBULATORY_CARE_PROVIDER_SITE_OTHER): Payer: Medicare Other | Admitting: Family Medicine

## 2012-08-16 ENCOUNTER — Ambulatory Visit (INDEPENDENT_AMBULATORY_CARE_PROVIDER_SITE_OTHER)
Admission: RE | Admit: 2012-08-16 | Discharge: 2012-08-16 | Disposition: A | Discharge status: Home / Self Care | Payer: Medicare Other | Source: Ambulatory Visit | Attending: Family Medicine | Admitting: Family Medicine

## 2012-08-16 ENCOUNTER — Encounter: Payer: Self-pay | Admitting: Family Medicine

## 2012-08-16 VITALS — BP 124/82 | HR 87 | Temp 98.2°F | Wt 242.2 lb

## 2012-08-16 DIAGNOSIS — J40 Bronchitis, not specified as acute or chronic: Secondary | ICD-10-CM

## 2012-08-16 DIAGNOSIS — J209 Acute bronchitis, unspecified: Secondary | ICD-10-CM

## 2012-08-16 DIAGNOSIS — R079 Chest pain, unspecified: Secondary | ICD-10-CM

## 2012-08-16 MED ORDER — BUDESONIDE-FORMOTEROL FUMARATE 80-4.5 MCG/ACT IN AERO: 2.0000 | INHALATION_SPRAY | Freq: Two times a day (BID) | RESPIRATORY_TRACT | Status: DC

## 2012-08-16 MED ORDER — CLARITHROMYCIN ER 500 MG PO TB24: 1000.0000 mg | ORAL_TABLET | Freq: Every day | ORAL | Status: AC

## 2012-08-16 MED ORDER — GUAIFENESIN-CODEINE 100-10 MG/5ML PO SYRP: ORAL_SOLUTION | ORAL | Status: DC

## 2012-08-16 MED ORDER — CEFTRIAXONE SODIUM 1 G IJ SOLR
2.0000 g | Freq: Once | INTRAMUSCULAR | Status: AC
  Administered 2012-08-16: 2 g via INTRAMUSCULAR

## 2012-08-16 NOTE — Patient Instructions (Signed)

## 2012-08-17 NOTE — Progress Notes (Signed)
  Subjective:     Donna Pham is a 57 y.o. female here for evaluation of a cough. Onset of symptoms was several days ago---see last visit. Symptoms have been gradually worsening since that time. The cough is productive and is aggravated by infection and reclining position. Associated symptoms include: shortness of breath, sputum production and wheezing. Patient does not have a history of asthma. Patient does not have a history of environmental allergens. Patient has not traveled recently. Patient does not have a history of smoking. Patient has not had a previous chest x-ray. Patient has not had a PPD done.  The following portions of the patient's history were reviewed and updated as appropriate: allergies, current medications, past family history, past medical history, past social history, past surgical history and problem list.  Review of Systems Pertinent items are noted in HPI.    Objective:    Oxygen saturation 97% on room air BP 124/82  Pulse 87  Temp 98.2 F (36.8 C) (Oral)  Wt 242 lb 3.2 oz (109.861 kg)  SpO2 97% General appearance: alert, cooperative, appears stated age and no distress Ears: normal TM's and external ear canals both ears Nose: Nares normal. Septum midline. Mucosa normal. No drainage or sinus tenderness. Throat: lips, mucosa, and tongue normal; teeth and gums normal Neck: no adenopathy, supple, symmetrical, trachea midline and thyroid not enlarged, symmetric, no tenderness/mass/nodules Lungs: rales bilaterally, rhonchi bilaterally and wheezes bilaterally Heart: S1, S2 normal    Assessment:    Acute Bronchitis and Influenza    Plan:    Antibiotics per medication orders. Antitussives per medication orders. Avoid exposure to tobacco smoke and fumes. B-agonist inhaler. Call if shortness of breath worsens, blood in sputum, change in character of cough, development of fever or chills, inability to maintain nutrition and hydration. Avoid exposure to tobacco  smoke and fumes. Chest x-ray. Steroid inhaler as ordered.

## 2012-09-06 ENCOUNTER — Other Ambulatory Visit: Payer: Self-pay

## 2012-09-06 MED ORDER — ALBUTEROL SULFATE (2.5 MG/3ML) 0.083% IN NEBU: 2.5000 mg | INHALATION_SOLUTION | Freq: Four times a day (QID) | RESPIRATORY_TRACT | Status: DC | PRN

## 2012-09-09 ENCOUNTER — Telehealth: Payer: Self-pay | Admitting: Family Medicine

## 2012-09-09 NOTE — Telephone Encounter (Signed)
[name] from [facility] states the patient does not have her albuterol yet, so they are pushing out the overnight oximetry a week or so. Call back if any questions.

## 2012-09-09 NOTE — Telephone Encounter (Signed)
The albuterol was re-sent the albuterol on Friday. I spoke with [name] and made her aware and I also called the patient and made her aware and she voiced understanding. She said she did get a call from the pharmacy but she did not have the care to go pick it up.  She will make [facility] aware when she has picked up the medication.      KP

## 2012-09-30 ENCOUNTER — Other Ambulatory Visit: Payer: Self-pay | Admitting: Family Medicine

## 2012-09-30 ENCOUNTER — Other Ambulatory Visit: Payer: Self-pay | Admitting: *Deleted

## 2012-09-30 ENCOUNTER — Other Ambulatory Visit: Payer: Self-pay | Admitting: Endocrinology

## 2012-09-30 MED ORDER — GLYBURIDE 1.25 MG PO TABS: 2.5000 mg | ORAL_TABLET | Freq: Every day | ORAL | Status: DC

## 2012-09-30 NOTE — Telephone Encounter (Signed)
Please refill x 1 Ov is due  

## 2012-09-30 NOTE — Telephone Encounter (Signed)
Please call pt and schedule OV. Thank you! 

## 2012-10-03 ENCOUNTER — Telehealth: Payer: Self-pay | Admitting: Endocrinology

## 2012-10-03 NOTE — Telephone Encounter (Signed)
Please read note below and be advised.  

## 2012-10-03 NOTE — Telephone Encounter (Signed)
Called patient to schedule an appointment per Dr. George Hugh request.  Patient has chosen another doctor that is closer to her home, and will continue her care with that provider. FYI message.

## 2012-10-14 ENCOUNTER — Ambulatory Visit (INDEPENDENT_AMBULATORY_CARE_PROVIDER_SITE_OTHER): Payer: Medicare Other | Admitting: Family Medicine

## 2012-10-14 ENCOUNTER — Encounter: Payer: Self-pay | Admitting: Family Medicine

## 2012-10-14 VITALS — BP 112/70 | HR 120 | Temp 99.9°F | Wt 235.6 lb

## 2012-10-14 DIAGNOSIS — I10 Essential (primary) hypertension: Secondary | ICD-10-CM

## 2012-10-14 DIAGNOSIS — IMO0002 Reserved for concepts with insufficient information to code with codable children: Secondary | ICD-10-CM

## 2012-10-14 DIAGNOSIS — A0811 Acute gastroenteropathy due to Norwalk agent: Secondary | ICD-10-CM

## 2012-10-14 DIAGNOSIS — N390 Urinary tract infection, site not specified: Secondary | ICD-10-CM

## 2012-10-14 DIAGNOSIS — K219 Gastro-esophageal reflux disease without esophagitis: Secondary | ICD-10-CM

## 2012-10-14 DIAGNOSIS — E1165 Type 2 diabetes mellitus with hyperglycemia: Secondary | ICD-10-CM

## 2012-10-14 DIAGNOSIS — E118 Type 2 diabetes mellitus with unspecified complications: Secondary | ICD-10-CM

## 2012-10-14 LAB — MICROALBUMIN / CREATININE URINE RATIO
Creatinine,U: 296.2 mg/dL
Microalb Creat Ratio: 0.8 mg/g (ref 0.0–30.0)
Microalb, Ur: 2.3 mg/dL — ABNORMAL HIGH (ref 0.0–1.9)

## 2012-10-14 LAB — HEMOGLOBIN A1C: Hgb A1c MFr Bld: 6.9 % — ABNORMAL HIGH (ref 4.6–6.5)

## 2012-10-14 LAB — BASIC METABOLIC PANEL
BUN: 18 mg/dL (ref 6–23)
CO2: 25 mEq/L (ref 19–32)
Calcium: 8.7 mg/dL (ref 8.4–10.5)
Chloride: 95 mEq/L — ABNORMAL LOW (ref 96–112)
Creatinine, Ser: 0.9 mg/dL (ref 0.4–1.2)
GFR: 69.12 mL/min (ref >60.00)
Glucose, Bld: 200 mg/dL — ABNORMAL HIGH (ref 70–99)
Potassium: 3.8 mEq/L (ref 3.5–5.1)
Sodium: 132 mEq/L — ABNORMAL LOW (ref 135–145)

## 2012-10-14 LAB — POCT URINALYSIS DIPSTICK
Blood, UA: NEGATIVE
Glucose, UA: NEGATIVE
Ketones, UA: NEGATIVE
Nitrite, UA: NEGATIVE
Spec Grav, UA: 1.025
Urobilinogen, UA: 0.2
pH, UA: 5

## 2012-10-14 LAB — HEPATIC FUNCTION PANEL
ALT: 25 U/L (ref 0–35)
AST: 22 U/L (ref 0–37)
Albumin: 4.1 g/dL (ref 3.5–5.2)
Alkaline Phosphatase: 77 U/L (ref 39–117)
Bilirubin, Direct: 0 mg/dL (ref 0.0–0.3)
Total Bilirubin: 0.5 mg/dL (ref 0.3–1.2)
Total Protein: 7.6 g/dL (ref 6.0–8.3)

## 2012-10-14 LAB — LIPID PANEL
Cholesterol: 190 mg/dL (ref 0–200)
HDL: 44.1 mg/dL (ref >39.00)
LDL Cholesterol: 116 mg/dL — ABNORMAL HIGH (ref 0–99)
Total CHOL/HDL Ratio: 4
Triglycerides: 150 mg/dL — ABNORMAL HIGH (ref 0.0–149.0)
VLDL: 30 mg/dL (ref 0.0–40.0)

## 2012-10-14 MED ORDER — ONDANSETRON 8 MG PO TBDP: 8.0000 mg | ORAL_TABLET | Freq: Three times a day (TID) | ORAL | Status: DC | PRN

## 2012-10-14 MED ORDER — PROMETHAZINE HCL 25 MG/ML IJ SOLN
25.0000 mg | Freq: Once | INTRAMUSCULAR | Status: AC
  Administered 2012-10-14: 25 mg via INTRAMUSCULAR

## 2012-10-14 MED ORDER — ESOMEPRAZOLE MAGNESIUM 40 MG PO CPDR: 40.0000 mg | DELAYED_RELEASE_CAPSULE | Freq: Every day | ORAL | Status: DC

## 2012-10-14 NOTE — Patient Instructions (Addendum)

## 2012-10-14 NOTE — Progress Notes (Signed)
  Subjective:     Donna Pham is a 57 y.o. female who presents for evaluation of nonbilious vomiting 10 times per day, diarrhea 10 times per day, dysuria, fever and nausea. Symptoms have been present for 1 day. Patient denies blood in stool, constipation, dark urine, heartburn, hematemesis, hematuria and melena. Patient's oral intake has been decreased for liquids and decreased for solids. Patient's urine output has been adequate. Other contacts with similar symptoms include: husband. Patient denies recent travel history. Patient has had recent ingestion of possible contaminated food, toxic plants, or inappropriate medications/poisons.   The following portions of the patient's history were reviewed and updated as appropriate: allergies, current medications, past family history, past medical history, past social history, past surgical history and problem list.  Review of Systems Pertinent items are noted in HPI.    Objective:     BP 112/70  Pulse 120  Temp(Src) 99.9 F (37.7 C) (Oral)  Wt 235 lb 9.6 oz (106.867 kg)  BMI 36.89 kg/m2  SpO2 96% General appearance: alert, cooperative, appears stated age and no distress Abdomen: soft, non-tender; bowel sounds normal; no masses,  no organomegaly    Assessment:    Acute Gastroenteritis    Plan:    1. Discussed oral rehydration, reintroduction of solid foods, signs of dehydration. 2. Return or go to emergency department if worsening symptoms, blood or bile, signs of dehydration, diarrhea lasting longer than 5 days or any new concerns. 3. Follow up in a few days or sooner as needed.  4 rx zofran,  And phenergan IM given in office

## 2012-10-15 LAB — CBC WITH DIFFERENTIAL/PLATELET
Basophils Absolute: 0 10*3/uL (ref 0.0–0.1)
Basophils Relative: 0.1 % (ref 0.0–3.0)
Eosinophils Absolute: 0 10*3/uL (ref 0.0–0.7)
Eosinophils Relative: 0.1 % (ref 0.0–5.0)
HCT: 44.3 % (ref 36.0–46.0)
Hemoglobin: 14.9 g/dL (ref 12.0–15.0)
Lymphocytes Relative: 6.3 % — ABNORMAL LOW (ref 12.0–46.0)
Lymphs Abs: 0.8 10*3/uL (ref 0.7–4.0)
MCHC: 33.6 g/dL (ref 30.0–36.0)
MCV: 95.4 fl (ref 78.0–100.0)
Monocytes Absolute: 0.6 10*3/uL (ref 0.1–1.0)
Monocytes Relative: 5 % (ref 3.0–12.0)
Neutro Abs: 11.1 10*3/uL — ABNORMAL HIGH (ref 1.4–7.7)
Neutrophils Relative %: 88.5 % — ABNORMAL HIGH (ref 43.0–77.0)
Platelets: 201 10*3/uL (ref 150.0–400.0)
RBC: 4.65 Mil/uL (ref 3.87–5.11)
RDW: 13.2 % (ref 11.5–14.6)
WBC: 12.6 10*3/uL — ABNORMAL HIGH (ref 4.5–10.5)

## 2012-10-18 LAB — URINE CULTURE: Colony Count: >=100000

## 2012-10-23 MED ORDER — CIPROFLOXACIN HCL 500 MG PO TABS: 500.0000 mg | ORAL_TABLET | Freq: Two times a day (BID) | ORAL | Status: DC

## 2012-10-23 MED ORDER — FENOFIBRATE 160 MG PO TABS: 160.0000 mg | ORAL_TABLET | Freq: Every day | ORAL | Status: DC

## 2012-10-24 ENCOUNTER — Telehealth: Payer: Self-pay | Admitting: Family Medicine

## 2012-10-24 DIAGNOSIS — D7289 Other specified disorders of white blood cells: Secondary | ICD-10-CM

## 2012-10-24 DIAGNOSIS — N390 Urinary tract infection, site not specified: Secondary | ICD-10-CM

## 2012-10-24 NOTE — Telephone Encounter (Signed)
+   uti-- cipro 500 mg bid for 5 days---- recheck urine and cbcd in 2 weeks Dx uti and abnormal cbcd DM controlled Cholesterol--- LDL goal < 70, HDL >40, TG < 150. Diet and exercise will increase HDL and decrease LDL and TG. Fish, Fish Oil, Flaxseed oil will also help increase the HDL and decrease Triglycerides. Recheck labs in 3 months------ Is she taking crestor? Cholesterol has increased.. If yes-- add fenofibrate 160 mg #30 1 po qd , 2 refills  Spoke with patient and she voiced understanding. She did not complain of dizziness      KP

## 2012-10-24 NOTE — Telephone Encounter (Signed)
Patient has been dizzy. Patient is requesting to speak with Selena Batten.

## 2012-11-05 ENCOUNTER — Ambulatory Visit (INDEPENDENT_AMBULATORY_CARE_PROVIDER_SITE_OTHER): Payer: Medicare Other | Admitting: Family Medicine

## 2012-11-05 ENCOUNTER — Encounter: Payer: Self-pay | Admitting: Family Medicine

## 2012-11-05 ENCOUNTER — Other Ambulatory Visit (INDEPENDENT_AMBULATORY_CARE_PROVIDER_SITE_OTHER): Payer: Medicare Other

## 2012-11-05 VITALS — BP 134/92 | HR 80 | Temp 98.3°F | Wt 236.8 lb

## 2012-11-05 DIAGNOSIS — M542 Cervicalgia: Secondary | ICD-10-CM

## 2012-11-05 DIAGNOSIS — IMO0001 Reserved for inherently not codable concepts without codable children: Secondary | ICD-10-CM

## 2012-11-05 DIAGNOSIS — D7289 Other specified disorders of white blood cells: Secondary | ICD-10-CM

## 2012-11-05 DIAGNOSIS — N390 Urinary tract infection, site not specified: Secondary | ICD-10-CM

## 2012-11-05 DIAGNOSIS — E785 Hyperlipidemia, unspecified: Secondary | ICD-10-CM

## 2012-11-05 DIAGNOSIS — G8929 Other chronic pain: Secondary | ICD-10-CM

## 2012-11-05 DIAGNOSIS — R300 Dysuria: Secondary | ICD-10-CM

## 2012-11-05 LAB — CBC WITH DIFFERENTIAL/PLATELET
Basophils Absolute: 0 10*3/uL (ref 0.0–0.1)
Basophils Relative: 0.3 % (ref 0.0–3.0)
Eosinophils Absolute: 0.1 10*3/uL (ref 0.0–0.7)
Eosinophils Relative: 0.8 % (ref 0.0–5.0)
HCT: 40.4 % (ref 36.0–46.0)
Hemoglobin: 13.6 g/dL (ref 12.0–15.0)
Lymphocytes Relative: 40.1 % (ref 12.0–46.0)
Lymphs Abs: 3.2 10*3/uL (ref 0.7–4.0)
MCHC: 33.6 g/dL (ref 30.0–36.0)
MCV: 95.1 fl (ref 78.0–100.0)
Monocytes Absolute: 0.7 10*3/uL (ref 0.1–1.0)
Monocytes Relative: 9 % (ref 3.0–12.0)
Neutro Abs: 4 10*3/uL (ref 1.4–7.7)
Neutrophils Relative %: 49.8 % (ref 43.0–77.0)
Platelets: 200 10*3/uL (ref 150.0–400.0)
RBC: 4.25 Mil/uL (ref 3.87–5.11)
RDW: 13.1 % (ref 11.5–14.6)
WBC: 8.1 10*3/uL (ref 4.5–10.5)

## 2012-11-05 LAB — POCT URINALYSIS DIPSTICK
Glucose, UA: NEGATIVE
Ketones, UA: NEGATIVE
Nitrite, UA: NEGATIVE
Spec Grav, UA: 1.02
Urobilinogen, UA: 0.2
pH, UA: 6

## 2012-11-05 MED ORDER — LEVOFLOXACIN 500 MG PO TABS: 500.0000 mg | ORAL_TABLET | Freq: Every day | ORAL | Status: DC

## 2012-11-05 NOTE — Progress Notes (Signed)
  Subjective:    Donna Pham is a 57 y.o. female who complains of burning with urination, right flank pain, frequency and suprapubic pressure. She has had symptoms for 5 days. Patient also complains of back pain. Patient denies congestion, cough, fever, headache, rhinitis, sorethroat and vaginal discharge. Patient does have a history of recurrent UTI. Patient does not have a history of pyelonephritis.   The following portions of the patient's history were reviewed and updated as appropriate: allergies, current medications, past family history, past medical history, past social history, past surgical history and problem list.  Review of Systems Pertinent items are noted in HPI.    Objective:    BP 134/92  Pulse 80  Temp(Src) 98.3 F (36.8 C) (Oral)  Wt 236 lb 12.8 oz (107.412 kg)  BMI 37.08 kg/m2  SpO2 97% General appearance: alert, cooperative, appears stated age and no distress Back: no tenderness to percussion or palpation Abdomen: soft, non-tender; bowel sounds normal; no masses,  no organomegaly  Laboratory:  Urine dipstick: trace for hemoglobin, 2+ for leukocyte esterase, trace for protein and 1+ for urobilinogen.   Micro exam: not done.    Assessment:    dysuria with recurrent uti    neck pain--- probably strain from sleeping on 2 pillows--pt requesting thyroid test Plan:    Medications: levaquin. Maintain adequate hydration. Follow up if symptoms not improving, and as needed. refer to urology

## 2012-11-05 NOTE — Patient Instructions (Addendum)
Asymptomatic Bacteriuria, Female  Your urine study shows bacteria in your urine. You do not have the usual symptoms of burning or frequent urination. This is why it is called asymptomatic. You may need treatment with antibiotics. Treatment is especially important if you are pregnant. Sometimes this condition can progress to a more severe bladder or kidney infection. Symptoms include burning when urinating, back pain, fever, nausea, or vomiting.  Take your antibiotics as directed. Finish them even if you start to feel better. Drink enough water and fluids to keep your urine clear or pale yellow. Go to the bathroom more frequently to keep your bladder empty. Keep the area around the vagina and rectum clean. Wipe yourself from front to back after urinating. Call your caregiver to arrange for follow-up care.   SEEK IMMEDIATE MEDICAL CARE IF:   You develop repeated vomiting.   You develop severe back or abdominal pain.   You have abnormal vaginal discharge or bleeding.   You have blood in the urine.   You develop cramping or abdominal pain.   You have a fever.  If you are pregnant and develop any of the above problems see your caregiver or seek care immediately.  Document Released: 07/03/2005 Document Revised: 09/25/2011 Document Reviewed: 05/19/2009  ExitCare Patient Information 2013 [employer].

## 2012-11-06 LAB — URINE CULTURE
Colony Count: NO GROWTH
Organism ID, Bacteria: NO GROWTH

## 2012-11-12 ENCOUNTER — Other Ambulatory Visit: Payer: Self-pay | Admitting: Family Medicine

## 2013-01-13 ENCOUNTER — Ambulatory Visit (INDEPENDENT_AMBULATORY_CARE_PROVIDER_SITE_OTHER): Payer: Medicare Other | Admitting: Family Medicine

## 2013-01-13 ENCOUNTER — Encounter: Payer: Self-pay | Admitting: Family Medicine

## 2013-01-13 VITALS — BP 120/76 | HR 82 | Temp 98.7°F | Wt 226.8 lb

## 2013-01-13 DIAGNOSIS — R300 Dysuria: Secondary | ICD-10-CM

## 2013-01-13 DIAGNOSIS — N390 Urinary tract infection, site not specified: Secondary | ICD-10-CM

## 2013-01-13 DIAGNOSIS — N760 Acute vaginitis: Secondary | ICD-10-CM

## 2013-01-13 DIAGNOSIS — B373 Candidiasis of vulva and vagina: Secondary | ICD-10-CM

## 2013-01-13 LAB — POCT URINALYSIS DIPSTICK
Bilirubin, UA: NEGATIVE
Blood, UA: NEGATIVE
Glucose, UA: NEGATIVE
Ketones, UA: NEGATIVE
Nitrite, UA: NEGATIVE
Protein, UA: NEGATIVE
Spec Grav, UA: 1.02
Urobilinogen, UA: 0.2
pH, UA: 5

## 2013-01-13 MED ORDER — FENOFIBRATE 160 MG PO TABS: 160.0000 mg | ORAL_TABLET | Freq: Every day | ORAL | Status: DC

## 2013-01-13 MED ORDER — TERCONAZOLE 0.4 % VA CREA: TOPICAL_CREAM | VAGINAL | Status: DC

## 2013-01-13 MED ORDER — ROSUVASTATIN CALCIUM 20 MG PO TABS: 20.0000 mg | ORAL_TABLET | Freq: Every day | ORAL | Status: DC

## 2013-01-13 MED ORDER — FLUCONAZOLE 150 MG PO TABS: ORAL_TABLET | ORAL | Status: DC

## 2013-01-13 NOTE — Progress Notes (Signed)
  Subjective:    Patient ID: Donna Pham, female    DOB: Apr 09, 1956, 57 y.o.   MRN: [id number]  HPI Pt here c/o vaginal itching --- no d/x.  No dysuria.   ? D/c   Review of Systems As above    Objective:   Physical Exam  BP 120/76  Pulse 82  Temp(Src) 98.7 F (37.1 C) (Oral)  Wt 226 lb 12.8 oz (102.876 kg)  BMI 35.51 kg/m2  SpO2 97% General appearance: alert, cooperative, appears stated age and no distress Lungs: clear to auscultation bilaterally Heart: S1, S2 normal Pelvic: cervix normal in appearance, no adnexal masses or tenderness, positive findings: vaginal discharge:  white and odorless, rectovaginal septum normal and uterus normal size, shape, and consistency Ext genitalia-- + red and escoriations      Assessment & Plan:

## 2013-01-13 NOTE — Patient Instructions (Addendum)
Vaginitis Vaginitis is an inflammation of the vagina. It is most often caused by a change in the normal balance of the bacteria and yeast that live in the vagina. This change in balance causes an overgrowth of certain bacteria or yeast, which causes the inflammation. There are different types of vaginitis, but the most common types are:  Bacterial vaginosis.  Yeast infection (candidiasis).  Trichomoniasis vaginitis. This is a sexually transmitted infection (STI).  Viral vaginitis.  Atropic vaginitis.  Allergic vaginitis. CAUSES  The cause depends on the type of vaginitis. Vaginitis can be caused by:  Bacteria (bacterial vaginosis).  Yeast (yeast infection).  A parasite (trichomoniasis vaginitis)  A virus (viral vaginitis).  Low hormone levels (atrophic vaginitis). Low hormone levels can occur during pregnancy, breastfeeding, or after menopause.  Irritants, such as bubble baths, scented tampons, and feminine sprays (allergic vaginitis). Other factors can change the normal balance of the yeast and bacteria that live in the vagina. These include:  Antibiotic medicines.  Poor hygiene.  Diaphragms, vaginal sponges, spermicides, birth control pills, and intrauterine devices (IUD).  Sexual intercourse.  Infection.  Uncontrolled diabetes.  A weakened immune system. SYMPTOMS  Symptoms can vary depending on the cause of the vaginitis. Common symptoms include:  Abnormal vaginal discharge.  The discharge is white, gray, or yellow with bacterial vaginosis.  The discharge is thick, white, and cheesy with a yeast infection.  The discharge is frothy and yellow or greenish with trichomoniasis.  A bad vaginal odor.  The odor is fishy with bacterial vaginosis.  Vaginal itching, pain, or swelling.  Painful intercourse.  Pain or burning when urinating. Sometimes, there are no symptoms. TREATMENT  Treatment will vary depending on the type of infection.   Bacterial  vaginosis and trichomoniasis are often treated with antibiotic creams or pills.  Yeast infections are often treated with antifungal medicines, such as vaginal creams or suppositories.  Viral vaginitis has no cure, but symptoms can be treated with medicines that relieve discomfort. Your sexual partner should be treated as well.  Atrophic vaginitis may be treated with an estrogen cream, pill, suppository, or vaginal ring. If vaginal dryness occurs, lubricants and moisturizing creams may help. You may be told to avoid scented soaps, sprays, or douches.  Allergic vaginitis treatment involves quitting the use of the product that is causing the problem. Vaginal creams can be used to treat the symptoms. HOME CARE INSTRUCTIONS   Take all medicines as directed by your caregiver.  Keep your genital area clean and dry. Avoid soap and only rinse the area with water.  Avoid douching. It can remove the healthy bacteria in the vagina.  Do not use tampons or have sexual intercourse until your vaginitis has been treated. Use sanitary pads while you have vaginitis.  Wipe from front to back. This avoids the spread of bacteria from the rectum to the vagina.  Let air reach your genital area.  Wear cotton underwear to decrease moisture buildup.  Avoid wearing underwear while you sleep until your vaginitis is gone.  Avoid tight pants and underwear or nylons without a cotton panel.  Take off wet clothing (especially bathing suits) as soon as possible.  Use mild, non-scented products. Avoid using irritants, such as:  Scented feminine sprays.  Fabric softeners.  Scented detergents.  Scented tampons.  Scented soaps or bubble baths.  Practice safe sex and use condoms. Condoms may prevent the spread of trichomoniasis and viral vaginitis. SEEK MEDICAL CARE IF:   You have abdominal pain.  You   have a fever or persistent symptoms for more than 2 3 days.  You have a fever and your symptoms suddenly  get worse. Document Released: 04/30/2007 Document Revised: 03/27/2012 Document Reviewed: 12/14/2011 ExitCare Patient Information 2014 [employer].  

## 2013-01-13 NOTE — Assessment & Plan Note (Signed)
Diflucan and terazol 7 rto prn

## 2013-01-13 NOTE — Assessment & Plan Note (Signed)
Check culture   

## 2013-01-14 LAB — WET PREP BY MOLECULAR PROBE
Candida species: NEGATIVE
Gardnerella vaginalis: NEGATIVE
Trichomonas vaginosis: NEGATIVE

## 2013-01-15 LAB — URINE CULTURE: Colony Count: 7000

## 2013-01-16 ENCOUNTER — Encounter: Payer: Self-pay | Admitting: *Deleted

## 2013-01-20 ENCOUNTER — Telehealth: Payer: Self-pay | Admitting: Family Medicine

## 2013-01-20 MED ORDER — ALPRAZOLAM 0.25 MG PO TABS: 0.2500 mg | ORAL_TABLET | Freq: Three times a day (TID) | ORAL | Status: DC | PRN

## 2013-01-20 NOTE — Telephone Encounter (Signed)
Patient called stating her dog died over the weekend and she and her husband are very stressed out and upset. Patient is requesting a prescription for xanax. Pt uses [facility] [address]

## 2013-01-20 NOTE — Telephone Encounter (Signed)
Rx faxed.    KP 

## 2013-01-20 NOTE — Telephone Encounter (Signed)
Ok to refill x 1  

## 2013-01-20 NOTE — Telephone Encounter (Signed)
Patient last filled medication 10/17/2012, Tid/PRN Last OV 01/13/2013 Please advise. GF/RN

## 2013-01-28 ENCOUNTER — Other Ambulatory Visit: Payer: Self-pay | Admitting: *Deleted

## 2013-01-28 ENCOUNTER — Other Ambulatory Visit: Payer: Medicare Other

## 2013-01-28 DIAGNOSIS — F419 Anxiety disorder, unspecified: Secondary | ICD-10-CM

## 2013-01-28 LAB — BASIC METABOLIC PANEL
BUN: 11 mg/dL (ref 6–23)
CO2: 26 mEq/L (ref 19–32)
Calcium: 9.4 mg/dL (ref 8.4–10.5)
Chloride: 104 mEq/L (ref 96–112)
Creatinine, Ser: 0.9 mg/dL (ref 0.4–1.2)
GFR: 69.96 mL/min (ref >60.00)
Glucose, Bld: 159 mg/dL — ABNORMAL HIGH (ref 70–99)
Potassium: 4.6 mEq/L (ref 3.5–5.1)
Sodium: 139 mEq/L (ref 135–145)

## 2013-01-28 LAB — CBC WITH DIFFERENTIAL/PLATELET
Basophils Absolute: 0 10*3/uL (ref 0.0–0.1)
Basophils Relative: 0.3 % (ref 0.0–3.0)
Eosinophils Absolute: 0 10*3/uL (ref 0.0–0.7)
Eosinophils Relative: 0.4 % (ref 0.0–5.0)
HCT: 42.6 % (ref 36.0–46.0)
Hemoglobin: 14.3 g/dL (ref 12.0–15.0)
Lymphocytes Relative: 30.1 % (ref 12.0–46.0)
Lymphs Abs: 2.6 10*3/uL (ref 0.7–4.0)
MCHC: 33.5 g/dL (ref 30.0–36.0)
MCV: 96.5 fl (ref 78.0–100.0)
Monocytes Absolute: 0.8 10*3/uL (ref 0.1–1.0)
Monocytes Relative: 8.8 % (ref 3.0–12.0)
Neutro Abs: 5.3 10*3/uL (ref 1.4–7.7)
Neutrophils Relative %: 60.4 % (ref 43.0–77.0)
Platelets: 213 10*3/uL (ref 150.0–400.0)
RBC: 4.42 Mil/uL (ref 3.87–5.11)
RDW: 13.3 % (ref 11.5–14.6)
WBC: 8.8 10*3/uL (ref 4.5–10.5)

## 2013-01-28 LAB — HEMOGLOBIN A1C: Hgb A1c MFr Bld: 8.3 % — ABNORMAL HIGH (ref 4.6–6.5)

## 2013-01-28 LAB — HEPATIC FUNCTION PANEL
ALT: 16 U/L (ref 0–35)
AST: 16 U/L (ref 0–37)
Albumin: 4.1 g/dL (ref 3.5–5.2)
Alkaline Phosphatase: 59 U/L (ref 39–117)
Bilirubin, Direct: 0 mg/dL (ref 0.0–0.3)
Total Bilirubin: 0.5 mg/dL (ref 0.3–1.2)
Total Protein: 7.4 g/dL (ref 6.0–8.3)

## 2013-01-28 LAB — LIPID PANEL
Cholesterol: 120 mg/dL (ref 0–200)
HDL: 45.4 mg/dL (ref >39.00)
LDL Cholesterol: 50 mg/dL (ref 0–99)
Total CHOL/HDL Ratio: 3
Triglycerides: 121 mg/dL (ref 0.0–149.0)
VLDL: 24.2 mg/dL (ref 0.0–40.0)

## 2013-01-28 LAB — MICROALBUMIN / CREATININE URINE RATIO
Creatinine,U: 124.6 mg/dL
Microalb Creat Ratio: 0.2 mg/g (ref 0.0–30.0)
Microalb, Ur: 0.3 mg/dL (ref 0.0–1.9)

## 2013-01-28 LAB — TSH: TSH: 0.63 u[IU]/mL (ref 0.35–5.50)

## 2013-01-28 MED ORDER — SERTRALINE HCL 100 MG PO TABS: 200.0000 mg | ORAL_TABLET | Freq: Every day | ORAL | Status: DC

## 2013-01-28 NOTE — Telephone Encounter (Signed)
Rx sent, Pt aware. 

## 2013-02-03 ENCOUNTER — Telehealth: Payer: Self-pay

## 2013-02-03 MED ORDER — METFORMIN HCL ER 750 MG PO TB24: 1500.0000 mg | ORAL_TABLET | Freq: Every evening | ORAL | Status: DC

## 2013-02-03 NOTE — Telephone Encounter (Signed)
Message copied by Arnette Norris on Mon Feb 03, 2013 10:10 AM ------      Message from: Lelon Perla      Created: Tue Jan 28, 2013 10:11 PM       Change metformin to 750 mg xr  2 po qpm  #60  2 refills,---  Dm not controlled      Rest of labs are great!!      rechekc 3 month-----272.4  250.02  Lipid , hep, bmp, hgba1c, , microalbumin ------

## 2013-02-03 NOTE — Telephone Encounter (Signed)
Patient has been made aware and voiced understanding. She agreed to start new med's.       KP

## 2013-02-12 ENCOUNTER — Encounter: Payer: Self-pay | Admitting: Family Medicine

## 2013-02-12 ENCOUNTER — Ambulatory Visit (INDEPENDENT_AMBULATORY_CARE_PROVIDER_SITE_OTHER): Payer: Medicare Other | Admitting: Family Medicine

## 2013-02-12 VITALS — BP 110/78 | HR 99 | Temp 98.6°F | Wt 222.2 lb

## 2013-02-12 DIAGNOSIS — J020 Streptococcal pharyngitis: Secondary | ICD-10-CM | POA: Insufficient documentation

## 2013-02-12 MED ORDER — AMOXICILLIN 875 MG PO TABS: 875.0000 mg | ORAL_TABLET | Freq: Two times a day (BID) | ORAL | Status: DC

## 2013-02-12 NOTE — Progress Notes (Signed)
  Subjective:    Patient ID: Donna Pham, female    DOB: [birth year], 57 y.o.   MRN: [id number]  HPI Sore throat- sxs started yesterday.  Very painful to swallow.  R sided.  No fevers.  No cough.  No facial pain/pressure.  No known sick contacts.  No nausea.   Review of Systems For ROS see HPI     Objective:   Physical Exam  Vitals reviewed. Constitutional: She appears well-developed and well-nourished. No distress.  HENT:  Head: Normocephalic and atraumatic.  Nose: Nose normal.  TMs normal bilaterally No TTP over sinuses R tonsillar enlargement w/ erythema and exudate  Neck: Normal range of motion. Neck supple.  Cardiovascular: Normal rate, regular rhythm and normal heart sounds.   Pulmonary/Chest: Effort normal and breath sounds normal. No respiratory distress. She has no wheezes. She has no rales.  Lymphadenopathy:    She has cervical adenopathy.          Assessment & Plan:

## 2013-02-12 NOTE — Patient Instructions (Addendum)
This is strep Start the Amoxicillin twice daily- take w/ food Drink plenty of fluids Alternate tylenol and ibuprofen for pain relief REST! Hang in there!!

## 2013-02-12 NOTE — Assessment & Plan Note (Signed)
New.  Start abx.  Tylenol/ibuprofen for pain relief.  Reviewed supportive care and red flags that should prompt return.  Pt expressed understanding and is in agreement w/ plan.

## 2013-02-21 ENCOUNTER — Encounter: Payer: Self-pay | Admitting: Family Medicine

## 2013-02-27 ENCOUNTER — Other Ambulatory Visit: Payer: Self-pay | Admitting: Family Medicine

## 2013-04-28 ENCOUNTER — Encounter: Payer: Self-pay | Admitting: Family Medicine

## 2013-04-28 ENCOUNTER — Ambulatory Visit (INDEPENDENT_AMBULATORY_CARE_PROVIDER_SITE_OTHER): Payer: Medicare Other | Admitting: Family Medicine

## 2013-04-28 VITALS — BP 130/70 | HR 90 | Temp 98.5°F | Wt 212.8 lb

## 2013-04-28 DIAGNOSIS — R079 Chest pain, unspecified: Secondary | ICD-10-CM

## 2013-04-28 DIAGNOSIS — F419 Anxiety disorder, unspecified: Secondary | ICD-10-CM

## 2013-04-28 DIAGNOSIS — Z23 Encounter for immunization: Secondary | ICD-10-CM

## 2013-04-28 DIAGNOSIS — H547 Unspecified visual loss: Secondary | ICD-10-CM

## 2013-04-28 DIAGNOSIS — N390 Urinary tract infection, site not specified: Secondary | ICD-10-CM

## 2013-04-28 DIAGNOSIS — E1159 Type 2 diabetes mellitus with other circulatory complications: Secondary | ICD-10-CM

## 2013-04-28 DIAGNOSIS — R319 Hematuria, unspecified: Secondary | ICD-10-CM

## 2013-04-28 DIAGNOSIS — I10 Essential (primary) hypertension: Secondary | ICD-10-CM

## 2013-04-28 DIAGNOSIS — F411 Generalized anxiety disorder: Secondary | ICD-10-CM

## 2013-04-28 DIAGNOSIS — E785 Hyperlipidemia, unspecified: Secondary | ICD-10-CM

## 2013-04-28 DIAGNOSIS — E119 Type 2 diabetes mellitus without complications: Secondary | ICD-10-CM

## 2013-04-28 DIAGNOSIS — N644 Mastodynia: Secondary | ICD-10-CM

## 2013-04-28 DIAGNOSIS — R109 Unspecified abdominal pain: Secondary | ICD-10-CM

## 2013-04-28 LAB — CBC WITH DIFFERENTIAL/PLATELET
Basophils Absolute: 0 10*3/uL (ref 0.0–0.1)
Basophils Relative: 0.3 % (ref 0.0–3.0)
Eosinophils Absolute: 0.1 10*3/uL (ref 0.0–0.7)
Eosinophils Relative: 0.5 % (ref 0.0–5.0)
HCT: 42.5 % (ref 36.0–46.0)
Hemoglobin: 14.4 g/dL (ref 12.0–15.0)
Lymphocytes Relative: 31.4 % (ref 12.0–46.0)
Lymphs Abs: 3.2 10*3/uL (ref 0.7–4.0)
MCHC: 34 g/dL (ref 30.0–36.0)
MCV: 93.4 fl (ref 78.0–100.0)
Monocytes Absolute: 0.8 10*3/uL (ref 0.1–1.0)
Monocytes Relative: 7.5 % (ref 3.0–12.0)
Neutro Abs: 6.2 10*3/uL (ref 1.4–7.7)
Neutrophils Relative %: 60.3 % (ref 43.0–77.0)
Platelets: 229 10*3/uL (ref 150.0–400.0)
RBC: 4.55 Mil/uL (ref 3.87–5.11)
RDW: 13.3 % (ref 11.5–14.6)
WBC: 10.3 10*3/uL (ref 4.5–10.5)

## 2013-04-28 LAB — POCT URINALYSIS DIPSTICK
Bilirubin, UA: NEGATIVE
Blood, UA: NEGATIVE
Glucose, UA: NEGATIVE
Ketones, UA: NEGATIVE
Nitrite, UA: NEGATIVE
Protein, UA: NEGATIVE
Spec Grav, UA: >=1.03
Urobilinogen, UA: 0.2
pH, UA: 6

## 2013-04-28 LAB — HEMOGLOBIN A1C: Hgb A1c MFr Bld: 7.6 % — ABNORMAL HIGH (ref 4.6–6.5)

## 2013-04-28 LAB — BASIC METABOLIC PANEL
BUN: 15 mg/dL (ref 6–23)
CO2: 27 mEq/L (ref 19–32)
Calcium: 9.4 mg/dL (ref 8.4–10.5)
Chloride: 101 mEq/L (ref 96–112)
Creatinine, Ser: 0.7 mg/dL (ref 0.4–1.2)
GFR: 85.37 mL/min (ref >60.00)
Glucose, Bld: 163 mg/dL — ABNORMAL HIGH (ref 70–99)
Potassium: 4.1 mEq/L (ref 3.5–5.1)
Sodium: 140 mEq/L (ref 135–145)

## 2013-04-28 LAB — LIPID PANEL
Cholesterol: 191 mg/dL (ref 0–200)
HDL: 51.5 mg/dL (ref >39.00)
LDL Cholesterol: 111 mg/dL — ABNORMAL HIGH (ref 0–99)
Total CHOL/HDL Ratio: 4
Triglycerides: 144 mg/dL (ref 0.0–149.0)
VLDL: 28.8 mg/dL (ref 0.0–40.0)

## 2013-04-28 LAB — MICROALBUMIN / CREATININE URINE RATIO
Creatinine,U: 202.6 mg/dL
Microalb Creat Ratio: 0.5 mg/g (ref 0.0–30.0)
Microalb, Ur: 1 mg/dL (ref 0.0–1.9)

## 2013-04-28 LAB — HEPATIC FUNCTION PANEL
ALT: 16 U/L (ref 0–35)
AST: 16 U/L (ref 0–37)
Albumin: 4.4 g/dL (ref 3.5–5.2)
Alkaline Phosphatase: 69 U/L (ref 39–117)
Bilirubin, Direct: 0 mg/dL (ref 0.0–0.3)
Total Bilirubin: 0.6 mg/dL (ref 0.3–1.2)
Total Protein: 8 g/dL (ref 6.0–8.3)

## 2013-04-28 MED ORDER — CLONAZEPAM 0.5 MG PO TABS: 0.5000 mg | ORAL_TABLET | Freq: Two times a day (BID) | ORAL | Status: DC | PRN

## 2013-04-28 NOTE — Patient Instructions (Signed)
Chest Pain (Nonspecific) °It is often hard to give a specific diagnosis for the cause of chest pain. There is always a chance that your pain could be related to something serious, such as a heart attack or a blood clot in the lungs. You need to follow up with your caregiver for further evaluation. °CAUSES  °· Heartburn. °· Pneumonia or bronchitis. °· Anxiety or stress. °· Inflammation around your heart (pericarditis) or lung (pleuritis or pleurisy). °· A blood clot in the lung. °· A collapsed lung (pneumothorax). It can develop suddenly on its own (spontaneous pneumothorax) or from injury (trauma) to the chest. °· Shingles infection (herpes zoster virus). °The chest wall is composed of bones, muscles, and cartilage. Any of these can be the source of the pain. °· The bones can be bruised by injury. °· The muscles or cartilage can be strained by coughing or overwork. °· The cartilage can be affected by inflammation and become sore (costochondritis). °DIAGNOSIS  °Lab tests or other studies, such as X-rays, electrocardiography, stress testing, or cardiac imaging, may be needed to find the cause of your pain.  °TREATMENT  °· Treatment depends on what may be causing your chest pain. Treatment may include: °· Acid blockers for heartburn. °· Anti-inflammatory medicine. °· Pain medicine for inflammatory conditions. °· Antibiotics if an infection is present. °· You may be advised to change lifestyle habits. This includes stopping smoking and avoiding alcohol, caffeine, and chocolate. °· You may be advised to keep your head raised (elevated) when sleeping. This reduces the chance of acid going backward from your stomach into your esophagus. °· Most of the time, nonspecific chest pain will improve within 2 to 3 days with rest and mild pain medicine. °HOME CARE INSTRUCTIONS  °· If antibiotics were prescribed, take your antibiotics as directed. Finish them even if you start to feel better. °· For the next few days, avoid physical  activities that bring on chest pain. Continue physical activities as directed. °· Do not smoke. °· Avoid drinking alcohol. °· Only take over-the-counter or prescription medicine for pain, discomfort, or fever as directed by your caregiver. °· Follow your caregiver's suggestions for further testing if your chest pain does not go away. °· Keep any follow-up appointments you made. If you do not go to an appointment, you could develop lasting (chronic) problems with pain. If there is any problem keeping an appointment, you must call to reschedule. °SEEK MEDICAL CARE IF:  °· You think you are having problems from the medicine you are taking. Read your medicine instructions carefully. °· Your chest pain does not go away, even after treatment. °· You develop a rash with blisters on your chest. °SEEK IMMEDIATE MEDICAL CARE IF:  °· You have increased chest pain or pain that spreads to your arm, neck, jaw, back, or abdomen. °· You develop shortness of breath, an increasing cough, or you are coughing up blood. °· You have severe back or abdominal pain, feel nauseous, or vomit. °· You develop severe weakness, fainting, or chills. °· You have a fever. °THIS IS AN EMERGENCY. Do not wait to see if the pain will go away. Get medical help at once. Call your local emergency services (911 in [place] Do not drive yourself to the hospital. °MAKE SURE YOU:  °· Understand these instructions. °· Will watch your condition. °· Will get help right away if you are not doing well or get worse. °Document Released: 04/12/2005 Document Revised: 09/25/2011 Document Reviewed: 02/06/2008 °ExitCare® Patient Information ©2014 [employer]   [employer]. ° °

## 2013-04-28 NOTE — Assessment & Plan Note (Signed)
Check labs 

## 2013-04-28 NOTE — Assessment & Plan Note (Signed)
stable °

## 2013-04-28 NOTE — Addendum Note (Signed)
Addended by: Silvio Pate D on: 04/28/2013 05:14 PM   Modules accepted: Orders

## 2013-04-28 NOTE — Assessment & Plan Note (Signed)
F/u cardiology If pain returns --go to ER

## 2013-04-28 NOTE — Progress Notes (Signed)
  Subjective:    Patient ID: Donna Pham, female    DOB: [birth year], 57 y.o.   MRN: [id number]  HPI Pt here f/u urology.  Hematuria and L flank pain w/u was neg for bladder or kidney.  Pt states the urologist felt it may be his spleen. Pt also under extreme stress---their dog died in her husbands arms not long ago and pt states the stress was so much she thought about taking her entire bottle of zoloft. Pt also c/o chest pain and pain L breast.  No sob or palpitations.      Review of Systems As above    Objective:   Physical Exam BP 130/70  Pulse 90  Temp(Src) 98.5 F (36.9 C) (Oral)  Wt 212 lb 12.8 oz (96.525 kg)  BMI 33.32 kg/m2  SpO2 97% General appearance: alert, cooperative, appears stated age and no distress Ears: normal TM's and external ear canals both ears Nose: Nares normal. Septum midline. Mucosa normal. No drainage or sinus tenderness. Throat: lips, mucosa, and tongue normal; teeth and gums normal Neck: no adenopathy, supple, symmetrical, trachea midline and thyroid not enlarged, symmetric, no tenderness/mass/nodules Lungs: clear to auscultation bilaterally Breasts: normal appearance, no masses or tenderness Heart: regular rate and rhythm, S1, S2 normal, no murmur, click, rub or gallop Abdomen: soft, non-tender; bowel sounds normal; no masses,  no organomegaly Extremities: extremities normal, atraumatic, no cyanosis or edema Psych-- teary eyed,  Very anxious,  Not suicidal EKG -- no change from previous       Assessment & Plan:

## 2013-04-28 NOTE — Assessment & Plan Note (Signed)
con't zoloft Add klonopin bid Referral to counselor

## 2013-04-28 NOTE — Assessment & Plan Note (Signed)
Diagnostic mammogram ordered.  

## 2013-04-30 ENCOUNTER — Other Ambulatory Visit: Payer: Self-pay | Admitting: Family Medicine

## 2013-04-30 LAB — URINE CULTURE: Colony Count: 40000

## 2013-05-05 ENCOUNTER — Telehealth: Payer: Self-pay

## 2013-05-05 DIAGNOSIS — E119 Type 2 diabetes mellitus without complications: Secondary | ICD-10-CM

## 2013-05-05 MED ORDER — ROSUVASTATIN CALCIUM 20 MG PO TABS: 30.0000 mg | ORAL_TABLET | Freq: Every day | ORAL | Status: DC

## 2013-05-05 NOTE — Telephone Encounter (Signed)
Message copied by Arnette Norris on Mon May 05, 2013 11:54 AM ------      Message from: Lelon Perla      Created: Sun May 04, 2013 11:06 AM       Cholesterol--- LDL goal < 70,  HDL >40,  TG < 150.  Diet and exercise will increase HDL and decrease LDL and TG.  Fish,  Fish Oil, Flaxseed oil will also help increase the HDL and decrease Triglycerides.   Recheck labs in 3 months      Increase crestor to 20 mg  1 1/2 tab po qhs,  #45  2 refills      DM better but still not controlled---  Se was supposed to see endo--I see a note where she told DR Ellison's office she was seeing someone else.  Who is she seeing?            272.4 250.02  Lipid, hep, bmp, hgba1c.             ------

## 2013-05-05 NOTE — Telephone Encounter (Signed)
Patient has been made aware and a new endo referral has been put in and the Rx sent to the pharmacy.     KP

## 2013-05-06 ENCOUNTER — Ambulatory Visit (HOSPITAL_BASED_OUTPATIENT_CLINIC_OR_DEPARTMENT_OTHER)
Admission: RE | Admit: 2013-05-06 | Discharge: 2013-05-06 | Disposition: A | Discharge status: Home / Self Care | Payer: Medicare Other | Source: Ambulatory Visit | Attending: Family Medicine | Admitting: Family Medicine

## 2013-05-06 DIAGNOSIS — R319 Hematuria, unspecified: Secondary | ICD-10-CM

## 2013-05-06 DIAGNOSIS — R109 Unspecified abdominal pain: Secondary | ICD-10-CM | POA: Insufficient documentation

## 2013-05-09 ENCOUNTER — Ambulatory Visit
Admission: RE | Admit: 2013-05-09 | Discharge: 2013-05-09 | Disposition: A | Discharge status: Home / Self Care | Payer: Medicare Other | Source: Ambulatory Visit | Attending: Family Medicine | Admitting: Family Medicine

## 2013-05-09 DIAGNOSIS — N644 Mastodynia: Secondary | ICD-10-CM

## 2013-05-13 ENCOUNTER — Telehealth: Payer: Self-pay | Admitting: *Deleted

## 2013-05-13 NOTE — Telephone Encounter (Signed)
Please advise      KP 

## 2013-05-13 NOTE — Telephone Encounter (Signed)
Patient just wanted the number to Urologist. Number given to the patient     KP

## 2013-05-13 NOTE — Telephone Encounter (Signed)
05/13/2013  Pt came by office today.  She filled out the walk-in form, requesting an "appointment for a doctor specializing with bladder issues as soon as possible".  Please advise. Thank you!  bw

## 2013-05-13 NOTE — Telephone Encounter (Signed)
She is already a pt of [facility]

## 2013-06-02 ENCOUNTER — Other Ambulatory Visit: Payer: Self-pay | Admitting: Family Medicine

## 2013-06-06 ENCOUNTER — Ambulatory Visit: Payer: Medicare Other | Admitting: Internal Medicine

## 2013-06-24 LAB — HM DIABETES EYE EXAM

## 2013-07-08 ENCOUNTER — Ambulatory Visit: Payer: Medicare Other | Admitting: Internal Medicine

## 2013-07-21 ENCOUNTER — Other Ambulatory Visit: Payer: Self-pay | Admitting: Family Medicine

## 2013-07-22 ENCOUNTER — Other Ambulatory Visit: Payer: Self-pay | Admitting: *Deleted

## 2013-07-24 ENCOUNTER — Telehealth: Payer: Self-pay | Admitting: Family Medicine

## 2013-07-24 NOTE — Telephone Encounter (Signed)
[facility] is calling to request a verbal order for lancets and testing strips for the patient. They faxed request on 07/21/13. They are asking that we send signed request back to them or call [phone] with verbal order. Please advise.

## 2013-07-24 NOTE — Telephone Encounter (Signed)
Form given to MD to sign.   Faxed      KP

## 2013-07-28 ENCOUNTER — Telehealth: Payer: Self-pay | Admitting: Family Medicine

## 2013-07-28 NOTE — Telephone Encounter (Signed)
[facility] is calling because they received an rx for diabetic testing supplies but they need the test and frequency in order to fill. Please call to verify.

## 2013-07-28 NOTE — Telephone Encounter (Signed)
Form re-faxed     KP

## 2013-08-18 ENCOUNTER — Ambulatory Visit: Payer: Medicare Other | Admitting: Internal Medicine

## 2013-08-26 ENCOUNTER — Ambulatory Visit: Payer: Medicare Other | Admitting: Internal Medicine

## 2013-08-28 ENCOUNTER — Ambulatory Visit (INDEPENDENT_AMBULATORY_CARE_PROVIDER_SITE_OTHER): Payer: Medicare Other | Admitting: Family Medicine

## 2013-08-28 ENCOUNTER — Encounter: Payer: Self-pay | Admitting: Family Medicine

## 2013-08-28 VITALS — BP 118/84 | HR 88 | Temp 98.3°F | Resp 20 | Wt 212.0 lb

## 2013-08-28 DIAGNOSIS — J329 Chronic sinusitis, unspecified: Secondary | ICD-10-CM

## 2013-08-28 DIAGNOSIS — E785 Hyperlipidemia, unspecified: Secondary | ICD-10-CM

## 2013-08-28 DIAGNOSIS — E669 Obesity, unspecified: Secondary | ICD-10-CM | POA: Insufficient documentation

## 2013-08-28 DIAGNOSIS — R059 Cough, unspecified: Secondary | ICD-10-CM

## 2013-08-28 DIAGNOSIS — R1011 Right upper quadrant pain: Secondary | ICD-10-CM

## 2013-08-28 DIAGNOSIS — R05 Cough: Secondary | ICD-10-CM

## 2013-08-28 DIAGNOSIS — E1159 Type 2 diabetes mellitus with other circulatory complications: Secondary | ICD-10-CM

## 2013-08-28 MED ORDER — GUAIFENESIN-CODEINE 100-10 MG/5ML PO SYRP: 5.0000 mL | ORAL_SOLUTION | Freq: Three times a day (TID) | ORAL | Status: DC | PRN

## 2013-08-28 NOTE — Patient Instructions (Signed)

## 2013-08-28 NOTE — Progress Notes (Signed)
Pre-visit discussion using our clinic review tool, as applicable. No additional management support is needed unless otherwise documented below in the visit note.  

## 2013-08-28 NOTE — Progress Notes (Signed)
  Subjective:     Donna Pham is a 58 y.o. female who presents for evaluation of sinus pain. Symptoms include: congestion, cough, facial pain, nasal congestion and sinus pressure. Onset of symptoms was 5 days ago. Symptoms have been gradually worsening since that time. Past history is significant for no history of pneumonia or bronchitis. Patient is a non-smoker.  The following portions of the patient's history were reviewed and updated as appropriate: allergies, current medications, past family history, past medical history, past social history, past surgical history and problem list.  Review of Systems Pertinent items are noted in HPI.   Objective:    BP 118/84  Pulse 88  Temp(Src) 98.3 F (36.8 C) (Oral)  Resp 20  Wt 212 lb (96.163 kg)  SpO2 98% General appearance: alert, cooperative, appears stated age and no distress Ears: normal TM's and external ear canals both ears Nose: green discharge, moderate congestion, turbinates red, swollen, sinus tenderness bilateral Throat: abnormal findings: mild oropharyngeal erythema Neck: mild anterior cervical adenopathy, supple, symmetrical, trachea midline and thyroid not enlarged, symmetric, no tenderness/mass/nodules Lungs: clear to auscultation bilaterally Heart: S1, S2 normal    Assessment:    Acute bacterial sinusitis.    Plan:    Nasal steroids per medication orders. Antihistamines per medication orders. Ceftin per medication orders.

## 2013-08-29 ENCOUNTER — Telehealth: Payer: Self-pay | Admitting: *Deleted

## 2013-08-29 ENCOUNTER — Telehealth: Payer: Self-pay

## 2013-08-29 ENCOUNTER — Other Ambulatory Visit: Payer: Self-pay | Admitting: Family Medicine

## 2013-08-29 DIAGNOSIS — J019 Acute sinusitis, unspecified: Secondary | ICD-10-CM

## 2013-08-29 MED ORDER — CEFUROXIME AXETIL 500 MG PO TABS: 500.0000 mg | ORAL_TABLET | Freq: Two times a day (BID) | ORAL | Status: AC

## 2013-08-29 NOTE — Telephone Encounter (Signed)
Pharmacist called and stated that patient thought that an antibiotic was going to be called in for her. Please advise

## 2013-08-29 NOTE — Telephone Encounter (Signed)
Relevant patient education mailed to patient.  

## 2013-08-29 NOTE — Telephone Encounter (Signed)
i sent it in

## 2013-08-29 NOTE — Telephone Encounter (Signed)
Please advise      KP 

## 2013-09-02 ENCOUNTER — Ambulatory Visit: Payer: Medicare Other | Admitting: Internal Medicine

## 2013-09-06 ENCOUNTER — Other Ambulatory Visit: Payer: Self-pay | Admitting: Family Medicine

## 2013-09-12 ENCOUNTER — Ambulatory Visit (INDEPENDENT_AMBULATORY_CARE_PROVIDER_SITE_OTHER): Payer: Medicare Other | Admitting: General Surgery

## 2013-09-23 ENCOUNTER — Other Ambulatory Visit (INDEPENDENT_AMBULATORY_CARE_PROVIDER_SITE_OTHER): Payer: Medicare Other

## 2013-09-23 DIAGNOSIS — E1159 Type 2 diabetes mellitus with other circulatory complications: Secondary | ICD-10-CM

## 2013-09-23 DIAGNOSIS — E785 Hyperlipidemia, unspecified: Secondary | ICD-10-CM

## 2013-09-23 LAB — BASIC METABOLIC PANEL
BUN: 16 mg/dL (ref 6–23)
CO2: 29 mEq/L (ref 19–32)
Calcium: 9.2 mg/dL (ref 8.4–10.5)
Chloride: 104 mEq/L (ref 96–112)
Creatinine, Ser: 0.7 mg/dL (ref 0.4–1.2)
GFR: 97.28 mL/min (ref >60.00)
Glucose, Bld: 191 mg/dL — ABNORMAL HIGH (ref 70–99)
Potassium: 4.3 mEq/L (ref 3.5–5.1)
Sodium: 140 mEq/L (ref 135–145)

## 2013-09-23 LAB — HEPATIC FUNCTION PANEL
ALT: 16 U/L (ref 0–35)
AST: 17 U/L (ref 0–37)
Albumin: 4.1 g/dL (ref 3.5–5.2)
Alkaline Phosphatase: 68 U/L (ref 39–117)
Bilirubin, Direct: 0.1 mg/dL (ref 0.0–0.3)
Total Bilirubin: 0.6 mg/dL (ref 0.3–1.2)
Total Protein: 7.3 g/dL (ref 6.0–8.3)

## 2013-09-23 LAB — POCT URINALYSIS DIPSTICK
Bilirubin, UA: NEGATIVE
Blood, UA: NEGATIVE
Glucose, UA: NEGATIVE
Ketones, UA: NEGATIVE
Leukocytes, UA: NEGATIVE
Nitrite, UA: NEGATIVE
Protein, UA: NEGATIVE
Spec Grav, UA: >=1.03
Urobilinogen, UA: 0.2
pH, UA: 6

## 2013-09-23 LAB — LIPID PANEL
Cholesterol: 137 mg/dL (ref 0–200)
HDL: 48.1 mg/dL (ref >39.00)
LDL Cholesterol: 61 mg/dL (ref 0–99)
Total CHOL/HDL Ratio: 3
Triglycerides: 139 mg/dL (ref 0.0–149.0)
VLDL: 27.8 mg/dL (ref 0.0–40.0)

## 2013-09-23 LAB — MICROALBUMIN / CREATININE URINE RATIO
Creatinine,U: 212.1 mg/dL
Microalb Creat Ratio: 0.3 mg/g (ref 0.0–30.0)
Microalb, Ur: 0.7 mg/dL (ref 0.0–1.9)

## 2013-09-23 LAB — HEMOGLOBIN A1C: Hgb A1c MFr Bld: 8.1 % — ABNORMAL HIGH (ref 4.6–6.5)

## 2013-09-29 ENCOUNTER — Telehealth: Payer: Self-pay | Admitting: *Deleted

## 2013-09-29 DIAGNOSIS — E119 Type 2 diabetes mellitus without complications: Secondary | ICD-10-CM

## 2013-09-29 NOTE — Telephone Encounter (Signed)
Message copied by Chilton Greathouse on Mon Sep 29, 2013  8:59 AM ------      Message from: Rosalita Chessman      Created: Tue Sep 23, 2013  8:49 PM       DM ;not controlled--- refer to Endo--- Dr Renne Crigler      Rest of labs are good----272.4  Lipid, hep , bmp ------

## 2013-09-29 NOTE — Telephone Encounter (Signed)
Spoke with patient and made aware of labs. Patient also educated on makinog better diet choices as well as incorporating exercise into her daily routine. Patient advised that she will need to see endo and that they would contact her with appt.    Referral placed to endo

## 2013-10-01 ENCOUNTER — Encounter: Payer: Self-pay | Admitting: *Deleted

## 2013-10-03 ENCOUNTER — Ambulatory Visit (INDEPENDENT_AMBULATORY_CARE_PROVIDER_SITE_OTHER): Payer: Medicare Other | Admitting: General Surgery

## 2013-10-03 VITALS — BP 126/80 | HR 75 | Temp 97.8°F | Ht 65.0 in | Wt 216.0 lb

## 2013-10-03 DIAGNOSIS — K828 Other specified diseases of gallbladder: Secondary | ICD-10-CM

## 2013-10-03 NOTE — Progress Notes (Signed)
Chief Complaint  Patient presents with  . Follow-up    HISTORY: Pt is a 58 yo F who I saw 3 years ago for biliary dyskinesia.  She is referred back by Dr. Etter Sjogren for consultation regarding her gallbladder.  She is having some more nausea/vomiting.  She denies any significant pain.  She has occasional epigastric pain, but only has this "after throwing up."  She correlates the nausea and vomiting with greasy foods and eating late at night.  She is still quite scared to have surgery.  She had HIDA in 2011 with EF 2%.  More recent ultrasound showed some sludge, which she did not have previously.  She denies fever/ chills.    Past Medical History  Diagnosis Date  . Depression   . Diabetes mellitus   . GERD (gastroesophageal reflux disease)   . Hyperlipidemia   . Hypertension   . CTS (carpal tunnel syndrome)     6/15-19/09 RLL PNA with sepsis high LFTs (ALT 61)  . Arthritis   . Right upper quadrant abdominal pain     No past surgical history on file.  Current Outpatient Prescriptions  Medication Sig Dispense Refill  . albuterol (PROVENTIL) (2.5 MG/3ML) 0.083% nebulizer solution Take 3 mLs (2.5 mg total) by nebulization 4 (four) times daily as needed for wheezing.  150 mL  1  . ALPRAZolam (XANAX) 0.25 MG tablet Take 1 tablet (0.25 mg total) by mouth 3 (three) times daily as needed. anxiety  30 tablet  0  . aspirin 81 MG tablet Take 81 mg by mouth daily.        . budesonide-formoterol (SYMBICORT) 80-4.5 MCG/ACT inhaler Inhale 2 puffs into the lungs 2 (two) times daily.  1 Inhaler  3  . clonazePAM (KLONOPIN) 0.5 MG tablet Take 1 tablet (0.5 mg total) by mouth 2 (two) times daily as needed for anxiety.  60 tablet  1  . CRESTOR 20 MG tablet TAKE 1 TABLET BY MOUTH DAILY  90 tablet  0  . esomeprazole (NEXIUM) 40 MG capsule Take 1 capsule (40 mg total) by mouth daily before breakfast.  90 capsule  3  . fenofibrate 160 MG tablet Take 1 tablet (160 mg total) by mouth daily.  30 tablet  2  . glyBURIDE  (DIABETA) 1.25 MG tablet Take 2 tablets (2.5 mg total) by mouth daily with breakfast.  350 tablet  1  . guaiFENesin-codeine (ROBITUSSIN AC) 100-10 MG/5ML syrup Take 5 mLs by mouth 3 (three) times daily as needed for cough.  120 mL  0  . metFORMIN (GLUCOPHAGE-XR) 750 MG 24 hr tablet Take 2 tabs by mouth in the evening--labs are due now  60 tablet  0  . ondansetron (ZOFRAN ODT) 8 MG disintegrating tablet Take 1 tablet (8 mg total) by mouth every 8 (eight) hours as needed for nausea.  20 tablet  0  . ONGLYZA 5 MG TABS tablet TAKE 1 TABLET BY MOUTH EVERY DAY  90 tablet  0  . pramoxine-hydrocortisone 1-1 % foam Apply 1 application topically 3 (three) times daily.  10 g  2  . sertraline (ZOLOFT) 100 MG tablet TAKE 2 TABLETS BY MOUTH DAILY  180 tablet  1  . terconazole (TERAZOL 7) 0.4 % vaginal cream Use externally for up to 7 days  45 g  0  . bromocriptine (PARLODEL) 2.5 MG tablet Take 1 tablet (2.5 mg total) by mouth at bedtime.  30 tablet  11  . gabapentin (NEURONTIN) 300 MG capsule Take 1 capsule (300  mg total) by mouth 3 (three) times daily.  90 capsule  3   No current facility-administered medications for this visit.     No Known Allergies   Family History  Problem Relation Age of Onset  . Arthritis Father   . Cancer Father   . Diabetes Mother      History   Social History  . Marital Status: Married    Spouse Name: N/A    Number of Children: N/A  . Years of Education: N/A   Occupational History  . Retired    Social History Main Topics  . Smoking status: Never Smoker   . Smokeless tobacco: Never Used  . Alcohol Use: No  . Drug Use: No  . Sexual Activity: Not on file   Other Topics Concern  . Not on file   Social History Narrative   No diet    Regular exercise-no   Originally from [place]     REVIEW OF SYSTEMS - PERTINENT POSITIVES ONLY: 12 point review of systems negative other than HPI and PMH.  EXAM: Filed Vitals:   10/03/13 1120  BP: 126/80  Pulse: 75   Temp: 97.8 F (36.6 C)    Wt Readings from Last 3 Encounters:  10/03/13 216 lb (97.977 kg)  08/28/13 212 lb (96.163 kg)  04/28/13 212 lb 12.8 oz (96.525 kg)     Gen:  No acute distress.  Well nourished and well groomed.   Neurological: Alert and oriented to person, place, and time. Coordination normal.  Head: Normocephalic and atraumatic.  Eyes: Conjunctivae are normal. Pupils are equal, round, and reactive to light. No scleral icterus.  Neck: Normal range of motion. Neck supple. No tracheal deviation or thyromegaly present.  Cardiovascular: Normal rate, regular rhythm Respiratory: Effort normal.  No respiratory distress. No chest wall tenderness.  GI: Soft. The abdomen is soft and nontender, non distended.  There is no rebound and no guarding.  Musculoskeletal: Normal range of motion. Extremities are nontender.  Skin: Skin is warm and dry. No rash noted. No diaphoresis. No erythema. No pallor. No clubbing, cyanosis, or edema.   Psychiatric: Normal mood and affect. Behavior is normal. Judgment and thought content normal.    LABORATORY RESULTS: Available labs are reviewed   Recent Results (from the past 2160 hour(s))  BASIC METABOLIC PANEL     Status: Abnormal   Collection Time    09/23/13 10:47 AM      Result Value Ref Range   Sodium 140  135 - 145 mEq/L   Potassium 4.3  3.5 - 5.1 mEq/L   Chloride 104  96 - 112 mEq/L   CO2 29  19 - 32 mEq/L   Glucose, Bld 191 (*) 70 - 99 mg/dL   BUN 16  6 - 23 mg/dL   Creatinine, Ser 0.7  0.4 - 1.2 mg/dL   Calcium 9.2  8.4 - 10.5 mg/dL   GFR 97.28  >60.00 mL/min  HEMOGLOBIN A1C     Status: Abnormal   Collection Time    09/23/13 10:47 AM      Result Value Ref Range   Hemoglobin A1C 8.1 (*) 4.6 - 6.5 %   Comment: Glycemic Control Guidelines for People with Diabetes:Non Diabetic:  <6%Goal of Therapy: <7%Additional Action Suggested:  >8%   HEPATIC FUNCTION PANEL     Status: None   Collection Time    09/23/13 10:47 AM      Result Value  Ref Range   Total Bilirubin 0.6  0.3 -  1.2 mg/dL   Bilirubin, Direct 0.1  0.0 - 0.3 mg/dL   Alkaline Phosphatase 68  39 - 117 U/L   AST 17  0 - 37 U/L   ALT 16  0 - 35 U/L   Total Protein 7.3  6.0 - 8.3 g/dL   Albumin 4.1  3.5 - 5.2 g/dL  LIPID PANEL     Status: None   Collection Time    09/23/13 10:47 AM      Result Value Ref Range   Cholesterol 137  0 - 200 mg/dL   Comment: ATP III Classification       Desirable:  < 200 mg/dL               Borderline High:  200 - 239 mg/dL          High:  > = 240 mg/dL   Triglycerides 139.0  0.0 - 149.0 mg/dL   Comment: Normal:  <150 mg/dLBorderline High:  150 - 199 mg/dL   HDL 48.10  >39.00 mg/dL   VLDL 27.8  0.0 - 40.0 mg/dL   LDL Cholesterol 61  0 - 99 mg/dL   Total CHOL/HDL Ratio 3     Comment:                Men          Women1/2 Average Risk     3.4          3.3Average Risk          5.0          4.42X Average Risk          9.6          7.13X Average Risk          15.0          11.0                      MICROALBUMIN / CREATININE URINE RATIO     Status: None   Collection Time    09/23/13 10:47 AM      Result Value Ref Range   Microalb, Ur 0.7  0.0 - 1.9 mg/dL   Creatinine,U 212.1     Microalb Creat Ratio 0.3  0.0 - 30.0 mg/g  POCT URINALYSIS DIPSTICK     Status: None   Collection Time    09/23/13  4:45 PM      Result Value Ref Range   Color, UA yellow     Clarity, UA clear     Glucose, UA Neg     Bilirubin, UA Neg     Ketones, UA Neg     Spec Grav, UA >=1.030     Blood, UA Neg     pH, UA 6.0     Protein, UA Neg     Urobilinogen, UA 0.2     Nitrite, UA Neg     Leukocytes, UA Negative       RADIOLOGY RESULTS: See E-Chart or I-Site for most recent results.  Images and reports are reviewed.  Abdominal u/s 04/2013  IMPRESSION:  1. No gallstones. No evidence of acute cholecystitis.  2. Evidence of cholesterolosis. Probable sludge in the gallbladder  fundus. This represents a change in the appearance of the  gallbladder from the  prior ultrasound.  3. Evidence of hepatic steatosis.  4. No other abnormalities   ASSESSMENT AND PLAN: Biliary dyskinesia Pt does appear to still have some symptoms of biliary  dyskinesia, but does know what seems to trigger symptoms.    She does not want to have surgery, and does not appear to have significant component of chronic cholecystitis.  I recommended avoiding eating late at night and to avoid greasy foods.   I will see her back in 4-5 months.  She is advised to call earlier if she develops chronic pain in the RUQ.         Milus Height MD Surgical Oncology, [facility]      Visit Diagnoses: 1. Biliary dyskinesia     Primary Care Physician: Garnet Koyanagi, DO

## 2013-10-03 NOTE — Patient Instructions (Signed)
Avoid eating late at night.  Would eat at least 2 hours before going to bed.  Also avoid greasy foods.  Follow up in 4-5 months.

## 2013-10-03 NOTE — Assessment & Plan Note (Signed)
Pt does appear to still have some symptoms of biliary dyskinesia, but does know what seems to trigger symptoms.    She does not want to have surgery, and does not appear to have significant component of chronic cholecystitis.  I recommended avoiding eating late at night and to avoid greasy foods.   I will see her back in 4-5 months.  She is advised to call earlier if she develops chronic pain in the RUQ.

## 2013-10-14 ENCOUNTER — Other Ambulatory Visit: Payer: Self-pay | Admitting: Family Medicine

## 2013-10-20 ENCOUNTER — Other Ambulatory Visit: Payer: Self-pay | Admitting: Endocrinology

## 2013-10-21 ENCOUNTER — Other Ambulatory Visit: Payer: Self-pay | Admitting: Internal Medicine

## 2013-10-21 ENCOUNTER — Encounter: Payer: Self-pay | Admitting: Internal Medicine

## 2013-10-21 ENCOUNTER — Ambulatory Visit (INDEPENDENT_AMBULATORY_CARE_PROVIDER_SITE_OTHER): Payer: Medicare Other | Admitting: Internal Medicine

## 2013-10-21 VITALS — BP 108/70 | HR 74 | Temp 98.4°F | Ht 65.0 in | Wt 215.0 lb

## 2013-10-21 DIAGNOSIS — E119 Type 2 diabetes mellitus without complications: Secondary | ICD-10-CM

## 2013-10-21 MED ORDER — GLIPIZIDE ER 10 MG PO TB24
10.0000 mg | ORAL_TABLET | Freq: Every day | ORAL | Status: DC
Start: 2013-10-21 — End: 2014-01-18

## 2013-10-21 NOTE — Patient Instructions (Signed)
Please stop Glyburide and start Glipizide XL 10 mg daily in am. Continue Metformin XR 750 mg 2 tablets at dinnertime. Continue Onglyza 5 mg daily  - move this to before breakfast.  Check sugars and write them down - check 3x a day.  Please return in 1 month with your sugar log.   PATIENT INSTRUCTIONS FOR TYPE 2 DIABETES:  **Please join MyChart!** - see attached instructions about how to join   DIET AND EXERCISE Diet and exercise is an important part of diabetic treatment.  We recommended aerobic exercise in the form of brisk walking (working between 40-60% of maximal aerobic capacity, similar to brisk walking) for 150 minutes per week (such as 30 minutes five days per week) along with 3 times per week performing 'resistance' training (using various gauge rubber tubes with handles) 5-10 exercises involving the major muscle groups (upper body, lower body and core) performing 10-15 repetitions (or near fatigue) each exercise. Start at half the above goal but build slowly to reach the above goals. If limited by weight, joint pain, or disability, we recommend daily walking in a swimming pool with water up to waist to reduce pressure from joints while allow for adequate exercise.    BLOOD GLUCOSES Monitoring your blood glucoses is important for continued management of your diabetes. Please check your blood glucoses 2-4 times a day: fasting, before meals and at bedtime (you can rotate these measurements - e.g. one day check before the 3 meals, the next day check before 2 of the meals and before bedtime, etc.   HYPOGLYCEMIA (low blood sugar) Hypoglycemia is usually a reaction to not eating, exercising, or taking too much insulin/ other diabetes drugs.  Symptoms include tremors, sweating, hunger, confusion, headache, etc. Treat IMMEDIATELY with 15 grams of Carbs:   4 glucose tablets    cup regular juice/soda   2 tablespoons raisins   4 teaspoons sugar   1 tablespoon honey Recheck blood glucose in  15 mins and repeat above if still symptomatic/blood glucose <100. Please contact our office at [phone] if you have questions about how to next handle your insulin.  RECOMMENDATIONS TO REDUCE YOUR RISK OF DIABETIC COMPLICATIONS: * Take your prescribed MEDICATION(S). * Follow a DIABETIC diet: Complex carbs, fiber rich foods, heart healthy fish twice weekly, (monounsaturated and polyunsaturated) fats * AVOID saturated/trans fats, high fat foods, >2,300 mg salt per day. * EXERCISE at least 5 times a week for 30 minutes or preferably daily.  * DO NOT SMOKE OR DRINK more than 1 drink a day. * Check your FEET every day. Do not wear tightfitting shoes. Contact us if you develop an ulcer * See your EYE doctor once a year or more if needed * Get a FLU shot once a year * Get a PNEUMONIA vaccine once before and once after age 67 years  GOALS:  * Your Hemoglobin A1c of <7%  * fasting sugars need to be <130 * after meals sugars need to be <180 (2h after you start eating) * Your Systolic BP should be 664 or lower  * Your Diastolic BP should be 80 or lower  * Your HDL (Good Cholesterol) should be 40 or higher  * Your LDL (Bad Cholesterol) should be 100 or lower  * Your Triglycerides should be 150 or lower  * Your Urine microalbumin (kidney function) should be <30 * Your Body Mass Index should be 25 or lower   We will be glad to help you achieve these goals. Our telephone  number is: [phone].

## 2013-10-21 NOTE — Progress Notes (Signed)
Patient ID: Donna Pham, female   DOB: 09-16-1955, 58 y.o.   MRN: [id number]  HPI: Donna Pham is a 58 y.o.-year-old [place] female, referred by her PCP, Dr. Etter Sjogren, for management of DM2, non-insulin-dependent, uncontrolled, without complications. She saw Dr Loanne Drilling in the past.   Patient has been diagnosed with diabetes in 2000; she has not been on insulin before. Last hemoglobin A1c was: Lab Results  Component Value Date   HGBA1C 8.1* 09/23/2013   HGBA1C 7.6* 04/28/2013   HGBA1C 8.3* 01/28/2013   Pt is on a regimen of: - Metformin XR 1500 mg po at night - Glyburide 2.5 mg with b'fast - Onglyza 5 mg daily at night! She tied Avandia in the past. She tried Actos in the past.   Pt checks her sugars 2-3x a day and they are: - am: 120-170 - 2h after b'fast: n/c - before lunch: n/c - 2h after lunch: 170-240 - before dinner: n/c - 2h after dinner: [phone])160-260 (299) - nighttime: n/c No lows. Lowest sugar was 70s; she has hypoglycemia awareness at 70.  Highest sugar was 299 x 1.  Pt's meals are: - Breakfast: coffee + waffle + blueberry - Lunch: chicken noodle soup, stews - Dinner: fried chicken - Snack: 1  She walks the dog daily.   - no CKD, last BUN/creatinine:  Lab Results  Component Value Date   BUN 16 09/23/2013   CREATININE 0.7 09/23/2013   - last set of lipids: Lab Results  Component Value Date   CHOL 137 09/23/2013   HDL 48.10 09/23/2013   LDLCALC 61 09/23/2013   LDLDIRECT 167.2 08/22/2010   TRIG 139.0 09/23/2013   CHOLHDL 3 09/23/2013  On Crestor and fenofibrate. - last eye exam was in 08/2013 >> cataracts >> will have Sx. No DR.  - no numbness and tingling in her feet.  I reviewed her chart and she also has a history of HTN, HL, h/o cholecystitis, fatty liver.  Pt has FH of DM in mother, uncle.  ROS: Constitutional: no weight gain/loss, no fatigue, no subjective hyperthermia/hypothermia, + poor sleep Eyes: + blurry vision, no xerophthalmia ENT: no sore  throat, no nodules palpated in throat, no dysphagia/odynophagia, no hoarseness, + hypoacusis Cardiovascular: no CP/SOB/palpitations/leg swelling Respiratory: no cough/SOB Gastrointestinal: no N/V/D/C, + heartburn Musculoskeletal: no muscle/joint aches Skin: no rashes Neurological: no tremors/numbness/tingling/dizziness, + HA Psychiatric: no depression/anxiety Low libido  Past Medical History  Diagnosis Date  . Depression   . Diabetes mellitus   . GERD (gastroesophageal reflux disease)   . Hyperlipidemia   . Hypertension   . CTS (carpal tunnel syndrome)     6/15-19/09 RLL PNA with sepsis high LFTs (ALT 61)  . Arthritis   . Right upper quadrant abdominal pain    No past surgical history on file. History   Social History  . Marital Status: Married    Spouse Name: N/A    Number of Children: 2   Occupational History  . homemaker    Social History Main Topics  . Smoking status: Never Smoker   . Smokeless tobacco: Never Used  . Alcohol Use: No  . Drug Use: No   Social History Narrative   No diet    Regular exercise-no   Originally from [place]   Current Outpatient Prescriptions on File Prior to Visit  Medication Sig Dispense Refill  . albuterol (PROVENTIL) (2.5 MG/3ML) 0.083% nebulizer solution Take 3 mLs (2.5 mg total) by nebulization 4 (four) times daily as needed for wheezing.  150 mL  1  . ALPRAZolam (XANAX) 0.25 MG tablet Take 1 tablet (0.25 mg total) by mouth 3 (three) times daily as needed. anxiety  30 tablet  0  . aspirin 81 MG tablet Take 81 mg by mouth daily.        . budesonide-formoterol (SYMBICORT) 80-4.5 MCG/ACT inhaler Inhale 2 puffs into the lungs 2 (two) times daily.  1 Inhaler  3  . clonazePAM (KLONOPIN) 0.5 MG tablet Take 1 tablet (0.5 mg total) by mouth 2 (two) times daily as needed for anxiety.  60 tablet  1  . CRESTOR 20 MG tablet TAKE 1 TABLET BY MOUTH DAILY  90 tablet  0  . esomeprazole (NEXIUM) 40 MG capsule Take 1 capsule (40 mg total) by mouth  daily before breakfast.  90 capsule  3  . fenofibrate 160 MG tablet Take 1 tablet (160 mg total) by mouth daily.  30 tablet  2  . guaiFENesin-codeine (ROBITUSSIN AC) 100-10 MG/5ML syrup Take 5 mLs by mouth 3 (three) times daily as needed for cough.  120 mL  0  . metFORMIN (GLUCOPHAGE-XR) 750 MG 24 hr tablet TAKE 2 TABLETS BY MOUTH EVERY EVENING  60 tablet  2  . ondansetron (ZOFRAN ODT) 8 MG disintegrating tablet Take 1 tablet (8 mg total) by mouth every 8 (eight) hours as needed for nausea.  20 tablet  0  . ONGLYZA 5 MG TABS tablet TAKE 1 TABLET BY MOUTH EVERY DAY  90 tablet  0  . pramoxine-hydrocortisone 1-1 % foam Apply 1 application topically 3 (three) times daily.  10 g  2  . sertraline (ZOLOFT) 100 MG tablet TAKE 2 TABLETS BY MOUTH DAILY  180 tablet  1  . terconazole (TERAZOL 7) 0.4 % vaginal cream Use externally for up to 7 days  45 g  0   No current facility-administered medications on file prior to visit.   No Known Allergies Family History  Problem Relation Age of Onset  . Arthritis Father   . Cancer Father   . Diabetes Mother    PE: BP 108/70  Pulse 74  Temp(Src) 98.4 F (36.9 C) (Oral)  Ht 5\' 5"  (1.651 m)  Wt 215 lb (97.523 kg)  BMI 35.78 kg/m2  SpO2 94% Wt Readings from Last 3 Encounters:  10/21/13 215 lb (97.523 kg)  10/03/13 216 lb (97.977 kg)  08/28/13 212 lb (96.163 kg)   Constitutional: obese, in NAD Eyes: PERRLA, EOMI, no exophthalmos ENT: moist mucous membranes, no thyromegaly, no cervical lymphadenopathy Cardiovascular: RRR, No MRG Respiratory: CTA B Gastrointestinal: abdomen soft, NT, ND, BS+ Musculoskeletal: no deformities, strength intact in all 4 Skin: moist, warm, no rashes Neurological: no tremor with outstretched hands, DTR normal in all 4  ASSESSMENT: 1. DM2, non-insulin-dependent, uncontrolled, without complications  PLAN:  1. Patient with long-standing, uncontrolled diabetes, on oral antidiabetic regimen, which became insufficient - We  discussed about options for treatment, and I suggested to:  Patient Instructions  Please stop Glyburide and start Glipizide XL 10 mg daily in am. Continue Metformin XR 750 mg 2 tablets at dinnertime. Continue Onglyza 5 mg daily  - move this to before breakfast. Check sugars and write them down - check 3x a day. Please return in 1 month with your sugar log.  - she has a h/o frequent UTIs, so Invokana is not a great option for her, but may consider 100 mg if really needed - Strongly advised her to start checking sugars at different times of the day - check 2-3  times a day, rotating checks - given sugar log and advised how to fill it and to bring it at next appt  - given foot care handout and explained the principles  - given instructions for hypoglycemia management "15-15 rule"  - advised for yearly eye exams - Return to clinic in 1 mo with sugar log

## 2013-10-28 ENCOUNTER — Encounter: Payer: Self-pay | Admitting: Family Medicine

## 2013-11-17 ENCOUNTER — Other Ambulatory Visit: Payer: Self-pay | Admitting: Family Medicine

## 2013-11-17 NOTE — Telephone Encounter (Signed)
Last seen 08/28/13 and filled 04/28/13 #60 with 1.  Please advise    KP

## 2013-11-20 ENCOUNTER — Ambulatory Visit: Payer: Medicare Other | Admitting: Internal Medicine

## 2013-11-21 ENCOUNTER — Ambulatory Visit (INDEPENDENT_AMBULATORY_CARE_PROVIDER_SITE_OTHER): Payer: Medicare Other | Admitting: Internal Medicine

## 2013-11-21 ENCOUNTER — Encounter: Payer: Self-pay | Admitting: Internal Medicine

## 2013-11-21 VITALS — BP 108/70 | HR 73 | Temp 98.2°F | Resp 12 | Wt 212.0 lb

## 2013-11-21 DIAGNOSIS — E119 Type 2 diabetes mellitus without complications: Secondary | ICD-10-CM

## 2013-11-21 MED ORDER — METFORMIN HCL ER 500 MG PO TB24: 2000.0000 mg | ORAL_TABLET | Freq: Every day | ORAL | Status: DC

## 2013-11-21 MED ORDER — CANAGLIFLOZIN 100 MG PO TABS: ORAL_TABLET | ORAL | Status: DC

## 2013-11-21 NOTE — Patient Instructions (Addendum)
Please increase Metformin XR to 2000 mg daily at dinnertime. Continue Glipizide XL 10 mg in am. Continue Onglyza 5 mg in am. Add Invokana 100 mg in am.  Please return in 1 month with your sugar log.

## 2013-11-21 NOTE — Progress Notes (Signed)
Patient ID: Donna Pham, female   DOB: 07-Dec-1955, 58 y.o.   MRN: [id number]  HPI: Donna Pham is a 58 y.o.-year-old [place] female, returning for f/u for DM2, dx 2000, non-insulin-dependent, uncontrolled, without complications. Last visit was 1 mo ago.  Last hemoglobin A1c was: Lab Results  Component Value Date   HGBA1C 8.1* 09/23/2013   HGBA1C 7.6* 04/28/2013   HGBA1C 8.3* 01/28/2013   Pt is on a regimen of: - Metformin XR 1500 mg po at night - Glipizide XL 10 mg - started 10/21/2013 - Onglyza 5 mg daily She tied Avandia in the past.  She tried Actos in the past.   Pt checks her sugars 2-3x a day and they are still high especially with Easter: - am: 120-170 >> 113-172 (140-160) - 2h after b'fast: n/c >> 124-270 (140-190) - before lunch: n/c >> 194, 303 - 2h after lunch: 170-240 >> 156-250 - before dinner: n/c >> 164, 178, 220 - 2h after dinner: (139)160-260 (299) >> 160-254 - nighttime: n/c >> 124-230 No lows. Lowest sugar was 113; she has hypoglycemia awareness at 70.  Highest sugar was 303 x 1.  Pt's meals are: - Breakfast: coffee + waffle + blueberry - Lunch: chicken noodle soup, stews - Dinner: fried chicken - Snack: 1 She walks the dog daily.   - no CKD, last BUN/creatinine:  Lab Results  Component Value Date   BUN 16 09/23/2013   CREATININE 0.7 09/23/2013   - last set of lipids: Lab Results  Component Value Date   CHOL 137 09/23/2013   HDL 48.10 09/23/2013   LDLCALC 61 09/23/2013   LDLDIRECT 167.2 08/22/2010   TRIG 139.0 09/23/2013   CHOLHDL 3 09/23/2013  On Crestor and fenofibrate. - last eye exam was in 08/2013 >> cataracts >> will have Sx. No DR.  - no numbness and tingling in her feet.  She also has a history of HTN, HL, h/o cholecystitis, fatty liver.  I reviewed pt's medications, allergies, PMH, social hx, family hx and no changes required, except as mentioned above.  ROS: Constitutional: no weight gain/loss, no fatigue, no subjective  hyperthermia/hypothermia Eyes: no blurry vision, no xerophthalmia ENT: no sore throat, no nodules palpated in throat, no dysphagia/odynophagia, no hoarseness Cardiovascular: no CP/SOB/palpitations/leg swelling Respiratory: no cough/SOB Gastrointestinal: no N/V/D/C Musculoskeletal: no muscle/joint aches Skin: no rashes Neurological: no tremors/numbness/tingling/dizziness  PE: BP 108/70  Pulse 73  Temp(Src) 98.2 F (36.8 C) (Oral)  Resp 12  Wt 212 lb (96.163 kg)  SpO2 96% Wt Readings from Last 3 Encounters:  11/21/13 212 lb (96.163 kg)  10/21/13 215 lb (97.523 kg)  10/03/13 216 lb (97.977 kg)   Constitutional: obese, in NAD Eyes: PERRLA, EOMI, no exophthalmos ENT: moist mucous membranes, no thyromegaly, no cervical lymphadenopathy Cardiovascular: RRR, No MRG Respiratory: CTA B Gastrointestinal: abdomen soft, NT, ND, BS+ Musculoskeletal: no deformities, strength intact in all 4 Skin: moist, warm, no rashes Neurological: no tremor with outstretched hands, DTR normal in all 4  ASSESSMENT: 1. DM2, non-insulin-dependent, uncontrolled, without complications  PLAN:  1. Patient with long-standing, uncontrolled diabetes, on oral antidiabetic regimen, which is still insufficient - We discussed about options for treatment, and I suggested to:  Patient Instructions  Please increase Metformin XR to 2000 mg daily at dinnertime. Continue Glipizide XL 10 mg in am. Continue Onglyza 5 mg in am. Add Invokana 100 mg in am. Please return in 1 month with your sugar log.  - she needs to work on her diet >> agrees to  try to reduce bread and sweets - she has a h/o frequent UTIs >> we will risk using Invokana at a low dose we discussed about SEs of Invokana, which are: dizziness (advised to be careful when stands from sitting position), decreased BP - usually not < normal (BP today is not low), and fungal UTIs (advised to let me know if develops one).  - given Invokana discount card - if she  cannot tolerate this, we can try a GLP1 R agonist - continue checking sugars at different times of the day - check 2-3 times a day, rotating checks - up to date with eye exams - Return to clinic in 1 mo with sugar log

## 2013-12-04 ENCOUNTER — Ambulatory Visit (INDEPENDENT_AMBULATORY_CARE_PROVIDER_SITE_OTHER): Payer: Medicare Other | Admitting: Internal Medicine

## 2013-12-04 ENCOUNTER — Encounter: Payer: Self-pay | Admitting: Internal Medicine

## 2013-12-04 VITALS — BP 106/69 | HR 75 | Temp 98.7°F | Wt 212.0 lb

## 2013-12-04 DIAGNOSIS — L259 Unspecified contact dermatitis, unspecified cause: Secondary | ICD-10-CM

## 2013-12-04 DIAGNOSIS — B3731 Acute candidiasis of vulva and vagina: Secondary | ICD-10-CM

## 2013-12-04 DIAGNOSIS — B373 Candidiasis of vulva and vagina: Secondary | ICD-10-CM

## 2013-12-04 DIAGNOSIS — N760 Acute vaginitis: Secondary | ICD-10-CM

## 2013-12-04 MED ORDER — TERCONAZOLE 0.4 % VA CREA: TOPICAL_CREAM | VAGINAL | Status: DC

## 2013-12-04 MED ORDER — BETAMETHASONE DIPROPIONATE 0.05 % EX CREA: TOPICAL_CREAM | Freq: Two times a day (BID) | CUTANEOUS | Status: DC

## 2013-12-04 NOTE — Progress Notes (Signed)
Pre-visit discussion using our clinic review tool. No additional management support is needed unless otherwise documented below in the visit note.  

## 2013-12-04 NOTE — Patient Instructions (Signed)
Poison Ivy: Use betamethasone twice a day for 10 days Benadryl OTC to help with itching Wash all your clothing Call if not improving  As far as the vaginal/genital rash, use TERAZOL x 1 week and call your primary doctor if no better  Poison [facility] ivy is a inflammation of the skin (contact dermatitis) caused by touching the allergens on the leaves of the ivy plant following previous exposure to the plant. The rash usually appears 48 hours after exposure. The rash is usually bumps (papules) or blisters (vesicles) in a linear pattern. Depending on your own sensitivity, the rash may simply cause redness and itching, or it may also progress to blisters which may break open. These must be well cared for to prevent secondary bacterial (germ) infection, followed by scarring. Keep any open areas dry, clean, dressed, and covered with an antibacterial ointment if needed. The eyes may also get puffy. The puffiness is worst in the morning and gets better as the day progresses. This dermatitis usually heals without scarring, within 2 to 3 weeks without treatment. HOME CARE INSTRUCTIONS  Thoroughly wash with soap and water as soon as you have been exposed to poison ivy. You have about one half hour to remove the plant resin before it will cause the rash. This washing will destroy the oil or antigen on the skin that is causing, or will cause, the rash. Be sure to wash under your fingernails as any plant resin there will continue to spread the rash. Do not rub skin vigorously when washing affected area. Poison ivy cannot spread if no oil from the plant remains on your body. A rash that has progressed to weeping sores will not spread the rash unless you have not washed thoroughly. It is also important to wash any clothes you have been wearing as these may carry active allergens. The rash will return if you wear the unwashed clothing, even several days later. Avoidance of the plant in the future is the best measure.  Poison ivy plant can be recognized by the number of leaves. Generally, poison ivy has three leaves with flowering branches on a single stem. Diphenhydramine may be purchased over the counter and used as needed for itching. Do not drive with this medication if it makes you drowsy.Ask your caregiver about medication for children. SEEK MEDICAL CARE IF:  Open sores develop.  Redness spreads beyond area of rash.  You notice purulent (pus-like) discharge.  You have increased pain.  Other signs of infection develop (such as fever). Document Released: 06/30/2000 Document Revised: 09/25/2011 Document Reviewed: 05/19/2009 [facility] Patient Information 2014 [place], [place].

## 2013-12-04 NOTE — Progress Notes (Signed)
Subjective:    Patient ID: Donna Pham, female    DOB: 1955-11-01, 58 y.o.   MRN: [id number]  DOS:  12/04/2013 Type of  visit: Acute visit  Develop an itchy rash on the arms 3 days ago after she work in the yard. Had similar symptoms last year, thinks is poison ivy but is not sure because she also has a genital rash. Wonders if they are related     ROS No fever or chills She was prescribed Terazol but use it only for 3 days and requests a prescription  Past Medical History  Diagnosis Date  . Depression   . Diabetes mellitus   . GERD (gastroesophageal reflux disease)   . Hyperlipidemia   . Hypertension   . CTS (carpal tunnel syndrome)     6/15-19/09 RLL PNA with sepsis high LFTs (ALT 61)  . Arthritis   . Right upper quadrant abdominal pain     History reviewed. No pertinent past surgical history.  History   Social History  . Marital Status: Married    Spouse Name: N/A    Number of Children: N/A  . Years of Education: N/A   Occupational History  . Retired    Social History Main Topics  . Smoking status: Never Smoker   . Smokeless tobacco: Never Used  . Alcohol Use: No  . Drug Use: No  . Sexual Activity: Not on file   Other Topics Concern  . Not on file   Social History Narrative   No diet    Regular exercise-no   Originally from [place]        Medication List       This list is accurate as of: 12/04/13  6:12 PM.  Always use your most recent med list.               albuterol (2.5 MG/3ML) 0.083% nebulizer solution  Commonly known as:  PROVENTIL  Take 3 mLs (2.5 mg total) by nebulization 4 (four) times daily as needed for wheezing.     ALPRAZolam 0.25 MG tablet  Commonly known as:  XANAX  Take 1 tablet (0.25 mg total) by mouth 3 (three) times daily as needed. anxiety     aspirin 81 MG tablet  Take 81 mg by mouth daily.     betamethasone dipropionate 0.05 % cream  Commonly known as:  DIPROLENE  Apply topically 2 (two) times daily. Use for  10 days at the arms for poison ivy     budesonide-formoterol 80-4.5 MCG/ACT inhaler  Commonly known as:  SYMBICORT  Inhale 2 puffs into the lungs 2 (two) times daily.     Canagliflozin 100 MG Tabs  Commonly known as:  INVOKANA  Take 1 tablet by mouth in am     clonazePAM 0.5 MG tablet  Commonly known as:  KLONOPIN  TAKE 1 TABLET BY MOUTH TWICE DAILY AS NEEDED FOR ANXIETY     CRESTOR 20 MG tablet  Generic drug:  rosuvastatin  TAKE 1 TABLET BY MOUTH DAILY     esomeprazole 40 MG capsule  Commonly known as:  NEXIUM  Take 1 capsule (40 mg total) by mouth daily before breakfast.     fenofibrate 160 MG tablet  Take 1 tablet (160 mg total) by mouth daily.     glipiZIDE 10 MG 24 hr tablet  Commonly known as:  GLUCOTROL XL  Take 1 tablet (10 mg total) by mouth daily with breakfast.     guaiFENesin-codeine 100-10 MG/5ML syrup  Commonly known as:  ROBITUSSIN AC  Take 5 mLs by mouth 3 (three) times daily as needed for cough.     metFORMIN 500 MG 24 hr tablet  Commonly known as:  GLUCOPHAGE XR  Take 4 tablets (2,000 mg total) by mouth daily with supper.     ondansetron 8 MG disintegrating tablet  Commonly known as:  ZOFRAN ODT  Take 1 tablet (8 mg total) by mouth every 8 (eight) hours as needed for nausea.     ONGLYZA 5 MG Tabs tablet  Generic drug:  saxagliptin HCl  TAKE 1 TABLET BY MOUTH EVERY DAY     pramoxine-hydrocortisone 1-1 % foam  Apply 1 application topically 3 (three) times daily.     sertraline 100 MG tablet  Commonly known as:  ZOLOFT  TAKE 2 TABLETS BY MOUTH DAILY     terconazole 0.4 % vaginal cream  Commonly known as:  TERAZOL 7  Use externally for up to 7 days           Objective:   Physical Exam BP 106/69  Pulse 75  Temp(Src) 98.7 F (37.1 C) (Oral)  Wt 212 lb (96.163 kg)  SpO2 96% General -- alert, well-developed, NAD.  Skin-- Few skin lesion at the arms, distal from elbows: papular 1-2 mm bumps, some grouped, mild erythema, no vesicles    Psych-- Cognition and judgment appear intact. Cooperative with normal attention span and concentration. No anxious or depressed appearing.        Assessment & Plan:    Poison ivy, Findings consistent with poison ivy, see instructions. Wonders if the rash in the arms is related to medications, I don't believe so.   Vaginal yeast  infection, Continue with symptoms, used TERAZOL only 3 days. Plan: Refill Terazol, use for one week, if not better call PCP. Probably related to diabetes or  diabetes medication.  F2F > 15, multiple questions answer

## 2013-12-05 ENCOUNTER — Telehealth: Payer: Self-pay | Admitting: *Deleted

## 2013-12-05 NOTE — Telephone Encounter (Signed)
Continue cream for now. If sxs come back >> can try diflucan 150 mg x 1.

## 2013-12-05 NOTE — Telephone Encounter (Signed)
Called pt and lvm advising her per Dr Gherghe's note.  

## 2013-12-05 NOTE — Telephone Encounter (Signed)
Pt called stating that the Invokana has given her a yeast infection. Pt also has poison ivy. She went to see Dr Larose Kells, he gave her a cream for both. She said she is sensitive to medications. She wants to know if she should continue with the cream for the yeast infection that Dr Larose Kells gave her or does she need something else. Pt is not sure what she should do. Communicating with her due to her language barrier was hard. Please advise. Pt is leaving home at 1 pm and would like a call before then. Thank you.

## 2013-12-13 ENCOUNTER — Other Ambulatory Visit: Payer: Self-pay | Admitting: Family Medicine

## 2013-12-30 ENCOUNTER — Telehealth: Payer: Self-pay | Admitting: Internal Medicine

## 2013-12-30 ENCOUNTER — Other Ambulatory Visit: Payer: Self-pay | Admitting: *Deleted

## 2013-12-30 MED ORDER — FLUCONAZOLE 150 MG PO TABS: 150.0000 mg | ORAL_TABLET | Freq: Once | ORAL | Status: DC

## 2013-12-30 NOTE — Telephone Encounter (Signed)
Donna Pham, Please call in Diflucan 150 mfg x 1 with 1 refill.

## 2013-12-30 NOTE — Telephone Encounter (Signed)
Pt is on invokana and believes it has caused her a yeast infection

## 2014-01-18 ENCOUNTER — Other Ambulatory Visit: Payer: Self-pay | Admitting: Internal Medicine

## 2014-01-18 ENCOUNTER — Other Ambulatory Visit: Payer: Self-pay | Admitting: Family Medicine

## 2014-01-19 NOTE — Telephone Encounter (Addendum)
Rx printed.  Signed by Dr. Birdie Riddle.  Faxed to [id number] - [place], [place] - [address] AT [address] [place].

## 2014-01-19 NOTE — Telephone Encounter (Signed)
Last seen 08/28/13 and filled 11/17/13 #60 UDS 09/24/13 low risk.  Please advise Donna Pham patient)     KP

## 2014-01-26 ENCOUNTER — Encounter (INDEPENDENT_AMBULATORY_CARE_PROVIDER_SITE_OTHER): Payer: Self-pay

## 2014-01-26 NOTE — Progress Notes (Unsigned)
Patient ID: Donna Pham, female   DOB: 1955/12/12, 58 y.o.   MRN: [id number] Patient refused long term f/u appointment.  She will call back prn.

## 2014-01-30 ENCOUNTER — Telehealth: Payer: Self-pay | Admitting: Internal Medicine

## 2014-01-30 MED ORDER — FLUCONAZOLE 150 MG PO TABS: 150.0000 mg | ORAL_TABLET | Freq: Once | ORAL | Status: DC

## 2014-01-30 NOTE — Telephone Encounter (Signed)
Called pt and spoke with pt's husband. Pt was sleeping. Advised him per Dr Arman Filter note. Advised him if pt has any questions to call. He understood.

## 2014-01-30 NOTE — Telephone Encounter (Signed)
I need to see her back in f/u. She already had at least 2 yeast inf. >> we may need to stop Invokana. For now, let's call in Diflucan 150 mg x 1 with 1 refill.

## 2014-01-30 NOTE — Telephone Encounter (Signed)
Patient is taking Ivokana 100 mg she stated it gave her yeast infection.  Please advise

## 2014-01-30 NOTE — Telephone Encounter (Signed)
Please read note below and advise.  

## 2014-02-02 ENCOUNTER — Ambulatory Visit (INDEPENDENT_AMBULATORY_CARE_PROVIDER_SITE_OTHER): Payer: Medicare Other | Admitting: Internal Medicine

## 2014-02-02 ENCOUNTER — Encounter: Payer: Self-pay | Admitting: Internal Medicine

## 2014-02-02 VITALS — BP 110/70 | HR 76 | Temp 97.9°F | Resp 12 | Wt 210.0 lb

## 2014-02-02 DIAGNOSIS — E119 Type 2 diabetes mellitus without complications: Secondary | ICD-10-CM

## 2014-02-02 LAB — HEMOGLOBIN A1C: Hgb A1c MFr Bld: 7.8 % — ABNORMAL HIGH (ref 4.6–6.5)

## 2014-02-02 MED ORDER — CYCLOSET 0.8 MG PO TABS: ORAL_TABLET | ORAL | Status: DC

## 2014-02-02 MED ORDER — GLIPIZIDE ER 10 MG PO TB24: ORAL_TABLET | ORAL | Status: DC

## 2014-02-02 NOTE — Patient Instructions (Addendum)
-   Metformin XR 2000 mg a day - Glipizide XL 10 mg in am - Onglyza 5 mg daily Please start Cycloset 0.8 mg daily first thing in am and then increase by 1 tablet every week up to 3 tablets in am.  Please return in 1.5 month with your sugar log.   Please stop at the lab.

## 2014-02-02 NOTE — Progress Notes (Signed)
Patient ID: Donna Pham, female   DOB: 09-09-1955, 58 y.o.   MRN: [id number]  HPI: Donna Pham is a 58 y.o.-year-old [place] female, returning for f/u for DM2, dx 2000, non-insulin-dependent, uncontrolled, without complications. Last visit was 2.5 mo ago.  Last hemoglobin A1c was: Lab Results  Component Value Date   HGBA1C 8.1* 09/23/2013   HGBA1C 7.6* 04/28/2013   HGBA1C 8.3* 01/28/2013   Pt is on a regimen of: - Metformin XR 1500 >> 2000 mg po at night - Glipizide XL 10 mg - started 10/21/2013 - Onglyza 5 mg daily - Invokana 100 mg - added at last visit. She had 3 yeast infections since last visit >> we stopped it 3 days ago. She tried Avandia in the past.  She tried Actos in the past.   Pt checks her sugars 2-3x a day >> she did not check after stopping Invokana, so we reviewed the sugars from before starting it: - am: 120-170 >> 113-172 (140-160) - 2h after b'fast: n/c >> 124-270 (140-190) - before lunch: n/c >> 194, 303 - 2h after lunch: 170-240 >> 156-250 - before dinner: n/c >> 164, 178, 220 - 2h after dinner: (139)160-260 (299) >> 160-254 - nighttime: n/c >> 124-230  She eats more fruit and bread lately. Pt's meals are: - Breakfast: coffee + waffle + blueberry - Lunch: chicken noodle soup, stews - Dinner: fried chicken - Snack: 1 She walks the dog daily.   - no CKD, last BUN/creatinine:  Lab Results  Component Value Date   BUN 16 09/23/2013   CREATININE 0.7 09/23/2013   - last set of lipids: Lab Results  Component Value Date   CHOL 137 09/23/2013   HDL 48.10 09/23/2013   LDLCALC 61 09/23/2013   LDLDIRECT 167.2 08/22/2010   TRIG 139.0 09/23/2013   CHOLHDL 3 09/23/2013  On Crestor and fenofibrate. - last eye exam was in 08/2013 >> cataracts >> will have Sx. No DR.  - no numbness and tingling in her feet.  She also has a history of HTN, HL, h/o cholecystitis, fatty liver.  I reviewed pt's medications, allergies, PMH, social hx, family hx and no changes  required, except as mentioned above.  ROS: Constitutional: no weight gain/loss, no fatigue, no subjective hyperthermia/hypothermia Eyes: no blurry vision, no xerophthalmia ENT: no sore throat, no nodules palpated in throat, no dysphagia/odynophagia, no hoarseness Cardiovascular: no CP/SOB/palpitations/leg swelling Respiratory: no cough/SOB Gastrointestinal: no N/V/D/C Musculoskeletal: no muscle/joint aches Skin: no rashes Neurological: no tremors/numbness/tingling/dizziness  PE: BP 110/70  Pulse 76  Temp(Src) 97.9 F (36.6 C) (Oral)  Resp 12  Wt 210 lb (95.255 kg)  SpO2 96% Wt Readings from Last 3 Encounters:  02/02/14 210 lb (95.255 kg)  12/04/13 212 lb (96.163 kg)  11/21/13 212 lb (96.163 kg)   Constitutional: obese, in NAD Eyes: PERRLA, EOMI, no exophthalmos ENT: moist mucous membranes, no thyromegaly, no cervical lymphadenopathy Cardiovascular: RRR, No MRG Respiratory: CTA B Gastrointestinal: abdomen soft, NT, ND, BS+ Musculoskeletal: no deformities, strength intact in all 4 Skin: moist, warm, no rashes Neurological: no tremor with outstretched hands, DTR normal in all 4  ASSESSMENT: 1. DM2, non-insulin-dependent, uncontrolled, without complications  PLAN:  1. Patient with long-standing, uncontrolled diabetes, on oral antidiabetic regimen, which is insufficient. We had to stop Invokana 2/2 SEs: repeated yeast infections. We will try Cycloset: Patient Instructions  - Metformin XR 2000 mg a day - Glipizide XL 10 mg in am - Onglyza 5 mg daily Please start Cycloset 0.8 mg daily first  thing in am and then increase by 1 tablet every week up to 3 tablets in am. Please return in 1.5 month with your sugar log.  Please stop at the lab. - discussed possible SEs of Cycloset - given discount card for Cycloset - she needs to work on her diet >> agrees to again try to reduce bread and sweets - continue checking sugars at different times of the day - check 2-3 times a day,  rotating checks - up to date with eye exams - will check HbA1c today - Return to clinic in 1.5 mo with sugar log   Office Visit on 02/02/2014  Component Date Value Ref Range Status  . Hemoglobin A1C 02/02/2014 7.8* 4.6 - 6.5 % Final   Glycemic Control Guidelines for People with Diabetes:Non Diabetic:  <6%Goal of Therapy: <7%Additional Action Suggested:  >8%    HbA1c a little better.

## 2014-02-09 ENCOUNTER — Telehealth: Payer: Self-pay | Admitting: Internal Medicine

## 2014-02-09 NOTE — Telephone Encounter (Signed)
Cycloset seems to not be working appropriately  Please call pt to get details

## 2014-02-09 NOTE — Telephone Encounter (Signed)
Returned pt's call. Pt said her sugars are high. She said yesterday was 311 (in the afternoon - she ate some watermelon). This morning when she woke up it was 170. This afternoon it was 131 at 3 pm. She ate a sandwich and some yogurt for lunch. Pt said bg has been in the 200's. She has been taking the cycloset every day. Please advise.

## 2014-02-09 NOTE — Telephone Encounter (Signed)
She is supposed to increase Cycloset every week by 1 tablet until she gets to 3 tablets. As of now, she is just on 1 tab, which is a very low dose. Also, please make sure she takes it 1st thing when she wakes up (she may need to put it on her nightstand).

## 2014-02-10 NOTE — Telephone Encounter (Signed)
Called pt and advised her per Dr Arman Filter note. Pt said she is to move up to 2 tablets on Thurs. She said that her sugars were better last night and this morning. She has not eaten anymore watermelon. Advised pt to keep an eye on her sugars and if they begin to increase again, to let us know. Pt understood.

## 2014-02-20 ENCOUNTER — Telehealth: Payer: Self-pay | Admitting: Internal Medicine

## 2014-02-20 ENCOUNTER — Telehealth: Payer: Self-pay | Admitting: Family Medicine

## 2014-02-20 NOTE — Telephone Encounter (Signed)
It is OK to eat that for b'fast. The CPs are not likely from [place], but she can reduce the dose to 1 a day and advance as tolerated, while contacting the PCP to go for an evaluation.

## 2014-02-20 NOTE — Telephone Encounter (Signed)
Spoke with patient and made her aware she needs to follow up with Endo, she said she already called them, she said she needs an appointment with [facility] for her cholesterol. Apt scheduled for 02/23/14 at 9 am. Patient has been made aware to fast.     KP

## 2014-02-20 NOTE — Telephone Encounter (Signed)
Please read note below. Called pt she said this happened yesterday. She checked her bg and it was 70. She had taken her 3 tabs of Cycloset and went outside. That's when she had chest pains. She called pharmacist he said that for today to only take 1 tablet. Pt said that for breakfast she had 1/2 blueberry waffle plain, cup of coffee with cream (no sugar). Her bg was 180 afterwards. I asked her if she took only 1 tablet this AM. Pt stated she did and she checked her bg it was 111. Pt wants to know if it is ok to eat this for breakfast. Pt was advised by pharmacist to check her bg at 9:00. Please advise.

## 2014-02-20 NOTE — Telephone Encounter (Signed)
Caller name: Veronika K. Relation to pt: self  Call back number: [phone]  Reason for call: medication CYCLOSET 0.8 MG TABS is increasing patient blood sugar would like to discuss.

## 2014-02-20 NOTE — Telephone Encounter (Signed)
Patient stated Dr Cruzita Lederer told her to take 3 tab a day of meds Cycloset 0.8 mg she started having chest pains.  Please advise

## 2014-02-20 NOTE — Telephone Encounter (Signed)
Called pt and lvm advising her per Dr Arman Filter note. Advised pt to let us know how her blood sugars are.

## 2014-02-23 ENCOUNTER — Encounter: Payer: Self-pay | Admitting: Family Medicine

## 2014-02-23 ENCOUNTER — Ambulatory Visit (INDEPENDENT_AMBULATORY_CARE_PROVIDER_SITE_OTHER): Payer: Medicare Other | Admitting: Family Medicine

## 2014-02-23 VITALS — BP 114/72 | HR 81 | Temp 98.0°F | Wt 211.0 lb

## 2014-02-23 DIAGNOSIS — I10 Essential (primary) hypertension: Secondary | ICD-10-CM

## 2014-02-23 DIAGNOSIS — R079 Chest pain, unspecified: Secondary | ICD-10-CM

## 2014-02-23 DIAGNOSIS — K2100 Gastro-esophageal reflux disease with esophagitis, without bleeding: Secondary | ICD-10-CM

## 2014-02-23 DIAGNOSIS — E785 Hyperlipidemia, unspecified: Secondary | ICD-10-CM

## 2014-02-23 DIAGNOSIS — E1149 Type 2 diabetes mellitus with other diabetic neurological complication: Secondary | ICD-10-CM

## 2014-02-23 LAB — POCT URINALYSIS DIPSTICK
Bilirubin, UA: NEGATIVE
Blood, UA: NEGATIVE
Glucose, UA: NEGATIVE
Ketones, UA: NEGATIVE
Leukocytes, UA: NEGATIVE
Nitrite, UA: NEGATIVE
Protein, UA: NEGATIVE
Spec Grav, UA: 1.015
Urobilinogen, UA: 0.2
pH, UA: 6

## 2014-02-23 LAB — TROPONIN I: Troponin I: <0.01 ng/mL (ref <0.06)

## 2014-02-23 MED ORDER — ESOMEPRAZOLE MAGNESIUM 40 MG PO CPDR: 40.0000 mg | DELAYED_RELEASE_CAPSULE | Freq: Every day | ORAL | Status: DC

## 2014-02-23 NOTE — Progress Notes (Signed)
Subjective:    Donna Pham is a 58 y.o. female who presents for evaluation of chest pain. Onset was 5 days ago. Symptoms have improved since that time. The patient describes the pain as pressure, sharp and does not radiate. Patient rates pain as a 8/10 in intensity. Associated symptoms are: chest pain. Aggravating factors are: none. Alleviating factors are: decreasing bromocriptine. Patient's cardiac risk factors are: diabetes mellitus, dyslipidemia, hypertension, obesity (BMI >= 30 kg/m2) and sedentary lifestyle. Patient's risk factors for DVT/PE: none. Previous cardiac testing: electrocardiogram (ECG).  The following portions of the patient's history were reviewed and updated as appropriate:  She  has a past medical history of Depression; Diabetes mellitus; GERD (gastroesophageal reflux disease); Hyperlipidemia; Hypertension; CTS (carpal tunnel syndrome); Arthritis; and Right upper quadrant abdominal pain. She  does not have any pertinent problems on file. She  has no past surgical history on file. Her family history includes Arthritis in her father; Cancer in her father; Diabetes in her mother. She  reports that she has never smoked. She has never used smokeless tobacco. She reports that she does not drink alcohol or use illicit drugs. She has a current medication list which includes the following prescription(s): albuterol, alprazolam, aspirin, betamethasone dipropionate, budesonide-formoterol, canagliflozin, clonazepam, crestor, cycloset, esomeprazole, fenofibrate, glipizide, metformin, ondansetron, onglyza, pramoxine-hydrocortisone, sertraline, and terconazole. Current Outpatient Prescriptions on File Prior to Visit  Medication Sig Dispense Refill  . albuterol (PROVENTIL) (2.5 MG/3ML) 0.083% nebulizer solution Take 3 mLs (2.5 mg total) by nebulization 4 (four) times daily as needed for wheezing.  150 mL  1  . ALPRAZolam (XANAX) 0.25 MG tablet Take 1 tablet (0.25 mg total) by mouth 3  (three) times daily as needed. anxiety  30 tablet  0  . aspirin 81 MG tablet Take 81 mg by mouth daily.        . betamethasone dipropionate (DIPROLENE) 0.05 % cream Apply topically 2 (two) times daily. Use for 10 days at the arms for poison ivy  30 g  0  . budesonide-formoterol (SYMBICORT) 80-4.5 MCG/ACT inhaler Inhale 2 puffs into the lungs 2 (two) times daily.  1 Inhaler  3  . Canagliflozin (INVOKANA) 100 MG TABS Take 1 tablet by mouth in am  30 tablet  3  . clonazePAM (KLONOPIN) 0.5 MG tablet TAKE 1 TABLET BY MOUTH TWICE DAILY AS NEEDED FOR ANXIETY  60 tablet  0  . CRESTOR 20 MG tablet TAKE 1 TABLET BY MOUTH DAILY  90 tablet  0  . CYCLOSET 0.8 MG TABS Take 3 tablets by mouth first thing in am  90 tablet  2  . fenofibrate 160 MG tablet Take 1 tablet (160 mg total) by mouth daily.  30 tablet  2  . glipiZIDE (GLUCOTROL XL) 10 MG 24 hr tablet Please take 1x a day in am  90 tablet  3  . metFORMIN (GLUCOPHAGE XR) 500 MG 24 hr tablet Take 4 tablets (2,000 mg total) by mouth daily with supper.  360 tablet  3  . ondansetron (ZOFRAN ODT) 8 MG disintegrating tablet Take 1 tablet (8 mg total) by mouth every 8 (eight) hours as needed for nausea.  20 tablet  0  . ONGLYZA 5 MG TABS tablet TAKE 1 TABLET BY MOUTH EVERY DAY  90 tablet  1  . pramoxine-hydrocortisone 1-1 % foam Apply 1 application topically 3 (three) times daily.  10 g  2  . sertraline (ZOLOFT) 100 MG tablet TAKE 2 TABLETS BY MOUTH DAILY  180 tablet  1  .  terconazole (TERAZOL 7) 0.4 % vaginal cream Use externally for up to 7 days  45 g  0   No current facility-administered medications on file prior to visit.   She has No Known Allergies..  Review of Systems Pertinent items are noted in HPI.    Objective:    BP 114/72  Pulse 81  Temp(Src) 98 F (36.7 C) (Oral)  Wt 211 lb (95.709 kg)  SpO2 96% General appearance: alert, cooperative, appears stated age and no distress Throat: lips, mucosa, and tongue normal; teeth and gums normal Neck:  no adenopathy, supple, symmetrical, trachea midline and thyroid not enlarged, symmetric, no tenderness/mass/nodules Lungs: clear to auscultation bilaterally Heart: S1, S2 normal Extremities: extremities normal, atraumatic, no cyanosis or edema  Cardiographics ECG: poor R-wave progression  Imaging Chest x-ray: not indicated    Assessment:    Chest pain,---? etiology   Plan:    Worsening signs and symptoms discussed and patient verbalized understanding. Cardiology consultation.   1. Chest pain, unspecified chest pain type This has occurred in past and pt was supposed to see cardiolgy but she did not secondary to copay-- she is willing to go now Endo doubts its med but pt states chest pain occurred when blood sugars dropped  - EKG 12-Lead - Troponin I - D-Dimer, Quantitative - Ambulatory referral to Cardiology  2. Other and unspecified hyperlipidemia Check labs - Hepatic function panel - Lipid panel  3. Type II or unspecified type diabetes mellitus with neurological manifestations, not stated as uncontrolled Per endo Check labs today - Basic metabolic panel - CBC with Differential - POCT urinalysis dipstick - Microalbumin / creatinine urine ratio - Hemoglobin A1c  4. Essential hypertension stable - Basic metabolic panel - CBC with Differential  5. Gastroesophageal reflux disease with esophagitis con't meds-- refilled today - esomeprazole (NEXIUM) 40 MG capsule; Take 1 capsule (40 mg total) by mouth daily before breakfast.  Dispense: 90 capsule; Refill: 3

## 2014-02-23 NOTE — Progress Notes (Signed)
Pre visit review using our clinic review tool, if applicable. No additional management support is needed unless otherwise documented below in the visit note. 

## 2014-02-23 NOTE — Patient Instructions (Signed)

## 2014-02-24 ENCOUNTER — Telehealth: Payer: Self-pay | Admitting: Family Medicine

## 2014-02-24 LAB — CBC WITH DIFFERENTIAL/PLATELET
Basophils Absolute: 0.1 10*3/uL (ref 0.0–0.1)
Basophils Relative: 1 % (ref 0.0–3.0)
Eosinophils Absolute: 0.1 10*3/uL (ref 0.0–0.7)
Eosinophils Relative: 1.2 % (ref 0.0–5.0)
HCT: 43.7 % (ref 36.0–46.0)
Hemoglobin: 14.6 g/dL (ref 12.0–15.0)
Lymphocytes Relative: 30.9 % (ref 12.0–46.0)
Lymphs Abs: 3.1 10*3/uL (ref 0.7–4.0)
MCHC: 33.4 g/dL (ref 30.0–36.0)
MCV: 95.3 fl (ref 78.0–100.0)
Monocytes Absolute: 0.5 10*3/uL (ref 0.1–1.0)
Monocytes Relative: 5 % (ref 3.0–12.0)
Neutro Abs: 6.1 10*3/uL (ref 1.4–7.7)
Neutrophils Relative %: 61.9 % (ref 43.0–77.0)
Platelets: 214 10*3/uL (ref 150.0–400.0)
RBC: 4.59 Mil/uL (ref 3.87–5.11)
RDW: 12.9 % (ref 11.5–15.5)
WBC: 9.9 10*3/uL (ref 4.0–10.5)

## 2014-02-24 LAB — HEPATIC FUNCTION PANEL
ALT: 15 U/L (ref 0–35)
AST: 19 U/L (ref 0–37)
Albumin: 4.3 g/dL (ref 3.5–5.2)
Alkaline Phosphatase: 69 U/L (ref 39–117)
Bilirubin, Direct: 0.1 mg/dL (ref 0.0–0.3)
Total Bilirubin: 0.7 mg/dL (ref 0.2–1.2)
Total Protein: 7.8 g/dL (ref 6.0–8.3)

## 2014-02-24 LAB — D-DIMER, QUANTITATIVE: D-Dimer, Quant: 0.46 ug/mL-FEU (ref 0.00–0.48)

## 2014-02-24 LAB — LIPID PANEL
Cholesterol: 157 mg/dL (ref 0–200)
HDL: 48 mg/dL (ref >39.00)
LDL Cholesterol: 71 mg/dL (ref 0–99)
NonHDL: 109
Total CHOL/HDL Ratio: 3
Triglycerides: 191 mg/dL — ABNORMAL HIGH (ref 0.0–149.0)
VLDL: 38.2 mg/dL (ref 0.0–40.0)

## 2014-02-24 LAB — BASIC METABOLIC PANEL
BUN: 16 mg/dL (ref 6–23)
CO2: 24 mEq/L (ref 19–32)
Calcium: 9.5 mg/dL (ref 8.4–10.5)
Chloride: 102 mEq/L (ref 96–112)
Creatinine, Ser: 0.8 mg/dL (ref 0.4–1.2)
GFR: 76.7 mL/min (ref >60.00)
Glucose, Bld: 146 mg/dL — ABNORMAL HIGH (ref 70–99)
Potassium: 4.4 mEq/L (ref 3.5–5.1)
Sodium: 139 mEq/L (ref 135–145)

## 2014-02-24 LAB — MICROALBUMIN / CREATININE URINE RATIO
Creatinine,U: 66.6 mg/dL
Microalb Creat Ratio: 0.3 mg/g (ref 0.0–30.0)
Microalb, Ur: 0.2 mg/dL (ref 0.0–1.9)

## 2014-02-24 LAB — HEMOGLOBIN A1C: Hgb A1c MFr Bld: 7.9 % — ABNORMAL HIGH (ref 4.6–6.5)

## 2014-02-24 NOTE — Telephone Encounter (Signed)
° °  Roanna Epley from [employer] has a question regarding D-Dimer lab work [phone]

## 2014-02-24 NOTE — Addendum Note (Signed)
Addended by: Modena Morrow D on: 02/24/2014 01:51 PM   Modules accepted: Orders

## 2014-02-24 NOTE — Telephone Encounter (Signed)
Spoke with [employer] and made her aware the test was not drawn yesterday and the patient is scheduled to come in an have it done today.      KP

## 2014-03-10 ENCOUNTER — Encounter: Payer: Self-pay | Admitting: Family Medicine

## 2014-03-26 ENCOUNTER — Institutional Professional Consult (permissible substitution): Payer: Medicare Other | Admitting: Cardiology

## 2014-05-08 ENCOUNTER — Institutional Professional Consult (permissible substitution): Payer: Medicare Other | Admitting: Cardiology

## 2014-05-26 ENCOUNTER — Encounter: Payer: Self-pay | Admitting: Medical

## 2014-05-26 ENCOUNTER — Ambulatory Visit (INDEPENDENT_AMBULATORY_CARE_PROVIDER_SITE_OTHER): Payer: Medicare Other | Admitting: Medical

## 2014-05-26 VITALS — BP 125/83 | HR 89 | Temp 98.3°F | Ht 63.75 in | Wt 219.0 lb

## 2014-05-26 DIAGNOSIS — J209 Acute bronchitis, unspecified: Secondary | ICD-10-CM

## 2014-05-26 DIAGNOSIS — R220 Localized swelling, mass and lump, head: Secondary | ICD-10-CM

## 2014-05-26 MED ORDER — ALBUTEROL SULFATE HFA 108 (90 BASE) MCG/ACT IN AERS: 2.0000 | INHALATION_SPRAY | Freq: Four times a day (QID) | RESPIRATORY_TRACT | Status: DC | PRN

## 2014-05-26 MED ORDER — BECLOMETHASONE DIPROPIONATE 40 MCG/ACT IN AERS: 2.0000 | INHALATION_SPRAY | Freq: Two times a day (BID) | RESPIRATORY_TRACT | Status: DC

## 2014-05-26 MED ORDER — AZITHROMYCIN 250 MG PO TABS: ORAL_TABLET | ORAL | Status: DC

## 2014-05-26 MED ORDER — BENZONATATE 100 MG PO CAPS: 100.0000 mg | ORAL_CAPSULE | Freq: Three times a day (TID) | ORAL | Status: DC | PRN

## 2014-05-26 NOTE — Patient Instructions (Addendum)
For your likely bronchitis, I am prescribing azithromycin antibiotic and benzonatate for cough.   For your wheezing, I am prescribing albuterol and qvar inhaler.  If your wheezing or bronchitis persists would need to get cxr and consider brief course oral prednisone.  For your occasional mid lip swelling. I will try to contact specialist to see if cycloset can be the cause. If they think not then will refer your to allergist. If at any point swelling of lips with sob or wheezing then go to ED.  Follow up in 7-10 days or as needed.

## 2014-05-26 NOTE — Assessment & Plan Note (Signed)
Bronchitis, I am prescribing azithromycin antibiotic and benzonatate for cough.

## 2014-05-26 NOTE — Assessment & Plan Note (Addendum)
This was present 2 weeks ago briefly and some today. 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 She has stopped cycloset presently. Pt has no problems breathing. Her o2 sat is 98%.   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 Pt has been on other meds for some time. So will refer to determine cause.

## 2014-05-26 NOTE — Progress Notes (Signed)
Pre visit review using our clinic review tool, if applicable. No additional management support is needed unless otherwise documented below in the visit note. 

## 2014-05-26 NOTE — Progress Notes (Signed)
Subjective:    Patient ID: Donna Pham, female    DOB: 1955/10/15, 58 y.o.   MRN: [id number]  HPI   Pt in for coughing for 3 days. Some wheezing at night. No smoking history . No second hand smoking. Pt cough dry but feels like should be productive.(Feels some chest congestion) No fever or chills. Some sweats at night even though taking tylenol and alleve. Pt in the past used some inhalers. Pt has some sneezing.   2 weeks ago had mild upper lip swelling and then again today mild. No sob or wheezing when she had upper lip swelling.  Pt only new medicine was cycloset.   Pt describes not throat swelling. No swelling anywhere except for the upperlipe.  Past Medical History  Diagnosis Date  . Depression   . Diabetes mellitus   . GERD (gastroesophageal reflux disease)   . Hyperlipidemia   . Hypertension   . CTS (carpal tunnel syndrome)     6/15-19/09 RLL PNA with sepsis high LFTs (ALT 61)  . Arthritis   . Right upper quadrant abdominal pain     History   Social History  . Marital Status: Married    Spouse Name: N/A    Number of Children: N/A  . Years of Education: N/A   Occupational History  . Retired    Social History Main Topics  . Smoking status: Never Smoker   . Smokeless tobacco: Never Used  . Alcohol Use: No  . Drug Use: No  . Sexual Activity: Not on file   Other Topics Concern  . Not on file   Social History Narrative   No diet    Regular exercise-no   Originally from [place]    No past surgical history on file.  Family History  Problem Relation Age of Onset  . Arthritis Father   . Cancer Father   . Diabetes Mother     No Known Allergies  Current Outpatient Prescriptions on File Prior to Visit  Medication Sig Dispense Refill  . albuterol (PROVENTIL) (2.5 MG/3ML) 0.083% nebulizer solution Take 3 mLs (2.5 mg total) by nebulization 4 (four) times daily as needed for wheezing. 150 mL 1  . ALPRAZolam (XANAX) 0.25 MG tablet Take 1 tablet (0.25 mg  total) by mouth 3 (three) times daily as needed. anxiety 30 tablet 0  . aspirin 81 MG tablet Take 81 mg by mouth daily.      . betamethasone dipropionate (DIPROLENE) 0.05 % cream Apply topically 2 (two) times daily. Use for 10 days at the arms for poison ivy 30 g 0  . budesonide-formoterol (SYMBICORT) 80-4.5 MCG/ACT inhaler Inhale 2 puffs into the lungs 2 (two) times daily. 1 Inhaler 3  . clonazePAM (KLONOPIN) 0.5 MG tablet TAKE 1 TABLET BY MOUTH TWICE DAILY AS NEEDED FOR ANXIETY 60 tablet 0  . CRESTOR 20 MG tablet TAKE 1 TABLET BY MOUTH DAILY 90 tablet 0  . CYCLOSET 0.8 MG TABS Take 3 tablets by mouth first thing in am 90 tablet 2  . esomeprazole (NEXIUM) 40 MG capsule Take 1 capsule (40 mg total) by mouth daily before breakfast. 90 capsule 3  . fenofibrate 160 MG tablet Take 1 tablet (160 mg total) by mouth daily. 30 tablet 2  . glipiZIDE (GLUCOTROL XL) 10 MG 24 hr tablet Please take 1x a day in am 90 tablet 3  . metFORMIN (GLUCOPHAGE XR) 500 MG 24 hr tablet Take 4 tablets (2,000 mg total) by mouth daily with supper. 360 tablet  3  . ondansetron (ZOFRAN ODT) 8 MG disintegrating tablet Take 1 tablet (8 mg total) by mouth every 8 (eight) hours as needed for nausea. 20 tablet 0  . ONGLYZA 5 MG TABS tablet TAKE 1 TABLET BY MOUTH EVERY DAY 90 tablet 1  . pramoxine-hydrocortisone 1-1 % foam Apply 1 application topically 3 (three) times daily. 10 g 2  . sertraline (ZOLOFT) 100 MG tablet TAKE 2 TABLETS BY MOUTH DAILY 180 tablet 1  . terconazole (TERAZOL 7) 0.4 % vaginal cream Use externally for up to 7 days 45 g 0   No current facility-administered medications on file prior to visit.    BP 125/83 mmHg  Pulse 89  Temp(Src) 98.3 F (36.8 C) (Oral)  Ht 5' 3.75" (1.619 m)  Wt 219 lb (99.338 kg)  BMI 37.90 kg/m2  SpO2 98%        Review of Systems  Constitutional: Negative for chills, diaphoresis and fatigue.  HENT: Positive for congestion and sneezing. Negative for ear pain, facial swelling,  sinus pressure, sore throat, tinnitus and trouble swallowing.        Minimal upper lip swelling this am.  Eyes: Negative for pain.  Respiratory: Positive for cough. Negative for chest tightness and wheezing.        Feels like cough should be productive.  Cardiovascular: Negative for chest pain and palpitations.  Gastrointestinal: Negative.   Musculoskeletal: Negative.   Skin: Negative.   Neurological: Negative for dizziness, seizures, syncope, speech difficulty, weakness, light-headedness and headaches.  Hematological: Negative for adenopathy. Does not bruise/bleed easily.  Psychiatric/Behavioral: Negative.        Objective:   Physical Exam   General  Mental Status - Alert. General Appearance - Well groomed. Not in acute distress.  Skin Rashes- No Rashes.  HEENT Head- Normal. Ear Auditory Canal - Left- Normal. Right - Normal.Tympanic Membrane- Left- Normal. Right- Normal. Eye Sclera/Conjunctiva- Left- Normal. Right- Normal. Nose & Sinuses Nasal Mucosa- Left- Mild  Boggy + Congested. Right-  Mild Boggy + Congested. Mouth & Throat Lips: Upper Lip- Normal: no dryness, cracking, pallor, cyanosis, or vesicular eruption. Lower Lip-MIld faint swollen upper lip: no dryness, cracking, pallor, cyanosis or vesicular eruption. Buccal Mucosa- Bilateral- No Aphthous ulcers. Buccal mucosa normal and not swollen. Oropharynx- No Discharge or Erythema. Tonsils: Characteristics- Bilateral- No Erythema or Congestion. Size/Enlargement- Bilateral- No enlargement. Discharge- bilateral-None.  Neck Neck- Supple. No Masses.   Chest and Lung Exam Auscultation: Breath Sounds:-Normal. Clear even unlabored.  Cardiovascular Auscultation:Rythm- Regular.  Murmurs & Other Heart Sounds:Ausculatation of the heart reveal- No Murmurs.  Lymphatic Head & Neck General Head & Neck Lymphatics: Bilateral: Description- No Localized lymphadenopathy.         Assessment & Plan:

## 2014-06-01 ENCOUNTER — Other Ambulatory Visit: Payer: Self-pay | Admitting: Internal Medicine

## 2014-06-02 ENCOUNTER — Other Ambulatory Visit: Payer: Self-pay | Admitting: Family Medicine

## 2014-06-03 ENCOUNTER — Telehealth: Payer: Self-pay | Admitting: Family Medicine

## 2014-06-03 ENCOUNTER — Encounter: Payer: Self-pay | Admitting: Family Medicine

## 2014-06-03 ENCOUNTER — Ambulatory Visit (INDEPENDENT_AMBULATORY_CARE_PROVIDER_SITE_OTHER): Payer: Medicare Other | Admitting: Family Medicine

## 2014-06-03 VITALS — BP 128/86 | HR 90 | Temp 98.1°F | Resp 16 | Wt 216.5 lb

## 2014-06-03 DIAGNOSIS — J0100 Acute maxillary sinusitis, unspecified: Secondary | ICD-10-CM

## 2014-06-03 MED ORDER — ALBUTEROL SULFATE (2.5 MG/3ML) 0.083% IN NEBU
2.5000 mg | INHALATION_SOLUTION | Freq: Once | RESPIRATORY_TRACT | Status: AC
  Administered 2014-06-03: 2.5 mg via RESPIRATORY_TRACT

## 2014-06-03 MED ORDER — GUAIFENESIN-CODEINE 100-10 MG/5ML PO SYRP: 10.0000 mL | ORAL_SOLUTION | Freq: Three times a day (TID) | ORAL | Status: DC | PRN

## 2014-06-03 MED ORDER — AMOXICILLIN 875 MG PO TABS: 875.0000 mg | ORAL_TABLET | Freq: Two times a day (BID) | ORAL | Status: DC

## 2014-06-03 MED ORDER — SAXAGLIPTIN HCL 5 MG PO TABS: 5.0000 mg | ORAL_TABLET | Freq: Every day | ORAL | Status: DC

## 2014-06-03 MED ORDER — ROSUVASTATIN CALCIUM 20 MG PO TABS: 30.0000 mg | ORAL_TABLET | Freq: Every day | ORAL | Status: DC

## 2014-06-03 NOTE — Progress Notes (Signed)
Pre visit review using our clinic review tool, if applicable. No additional management support is needed unless otherwise documented below in the visit note. 

## 2014-06-03 NOTE — Progress Notes (Signed)
   Subjective:    Patient ID: Donna Pham, female    DOB: 11-Apr-1956, 58 y.o.   MRN: [id number]  HPI URI- pt was seen on 11/10 by Percell Miller and started on Zpack, Qvar, cough meds.  Pt reports cough continues.  CBGs >200 recently.  Started Diabetic Tussin last night.  + facial pain/pressure- worse w/ bending forward.  No ear pain.  + nasal congestion.   Review of Systems For ROS see HPI     Objective:   Physical Exam  Constitutional: She appears well-developed and well-nourished. No distress.  HENT:  Head: Normocephalic and atraumatic.  Right Ear: Tympanic membrane normal.  Left Ear: Tympanic membrane normal.  Nose: Mucosal edema and rhinorrhea present. Right sinus exhibits maxillary sinus tenderness. Right sinus exhibits no frontal sinus tenderness. Left sinus exhibits maxillary sinus tenderness. Left sinus exhibits no frontal sinus tenderness.  Mouth/Throat: Uvula is midline and mucous membranes are normal. Posterior oropharyngeal erythema present. No oropharyngeal exudate.  Eyes: Conjunctivae and EOM are normal. Pupils are equal, round, and reactive to light.  Neck: Normal range of motion. Neck supple.  Cardiovascular: Normal rate, regular rhythm and normal heart sounds.   Pulmonary/Chest: Effort normal and breath sounds normal. No respiratory distress. She has no wheezes.  + hacking cough  Lymphadenopathy:    She has no cervical adenopathy.  Vitals reviewed.         Assessment & Plan:

## 2014-06-03 NOTE — Telephone Encounter (Signed)
Patient Information:  Caller Name: Chauntelle  Phone: [phone]  Patient: , Donna Pham  Gender: Female  DOB: 04-25-1956  Age: 58 Years  PCP: Rosalita Chessman.  Office Follow Up:  Does the office need to follow up with this patient?: No  Instructions For The Office: N/A  RN Note:  Requesting appointment. Sx not improved with treatment.   Symptoms  Reason For Call & Symptoms: Seen in the office on 05/26/14 for dry wheezy cough and prescribed Z pack, Bensonatate and Qvar and Albuterol and it has not helped much. She is still wheezing and has a frequent harsh cough.  BG elevated = 205 this morning before breakfast. Last night BG = 198. She started with mild dizziness this morning when she woke up, worse after coughing. Has hx of Pneumonia 3 years ago and she is concerned that sx not improving..  Reviewed Health History In EMR: Yes  Reviewed Medications In EMR: Yes  Reviewed Allergies In EMR: Yes  Reviewed Surgeries / Procedures: Yes  Date of Onset of Symptoms: 05/23/2014  Treatments Tried: Humidifier, Diabetic Tussin Cough Med-  Treatments Tried Worked: Yes  Guideline(s) Used:  Cough  Diabetes - High Blood Sugar  Dizziness  Disposition Per Guideline:   See Today in Office  Reason For Disposition Reached:   Patient wants to be seen  Wheezing/ sx not improved after prescribed treatment  Advice Given:  Call Back If:  Blood glucose more than 300 mg/dL (16.5 mmol/l), 2 or more times in a row.  Urine ketones become moderate or large  Vomiting lasting more than 4 hours or unable to drink any liquids.  Rapid breathing occurs  You become worse.  Stand Up Slowly:  In the mornings, sit up for a few minutes before you stand up. That will help your blood flow make the adjustment.  If you have to stand up for long periods of time, contract and relax your leg muscles to help pump the blood back to the heart.  Sit down or lie down if you feel dizzy.  Call Back If:  Still feel dizzy after 2  hours of rest and fluids  Passes out (faints)  You become worse.  Patient Will Follow Care Advice:  YES  Appointment Scheduled:  06/03/2014 11:30:00 Appointment Scheduled Provider:  Midge Minium.

## 2014-06-03 NOTE — Assessment & Plan Note (Signed)
New.  Pt w/ both maxillary sinusitis and bronchitis.  Cough and air movement improved s/p neb tx.  Start Amox for sinus infxn.  Reviewed med list w/ pt and instructed her on use of albuterol HFA.  Cough meds prn.  Reviewed supportive care and red flags that should prompt return.  Pt expressed understanding and is in agreement w/ plan.

## 2014-06-03 NOTE — Patient Instructions (Signed)
Follow up as needed Start the Amoxicillin twice daily- take w/ food Use the cough syrup as needed Drink plenty of fluids REST! Continue the albuterol inhaler as needed for cough, shortness of breath, wheezing Call with any questions or concerns Hang in there!!

## 2014-06-08 ENCOUNTER — Institutional Professional Consult (permissible substitution): Payer: Medicare Other | Admitting: Cardiology

## 2014-07-22 ENCOUNTER — Other Ambulatory Visit: Payer: Self-pay | Admitting: Internal Medicine

## 2014-08-04 ENCOUNTER — Encounter: Payer: Self-pay | Admitting: Family Medicine

## 2014-08-04 ENCOUNTER — Ambulatory Visit (INDEPENDENT_AMBULATORY_CARE_PROVIDER_SITE_OTHER): Payer: Commercial Managed Care - HMO | Admitting: Family Medicine

## 2014-08-04 VITALS — BP 112/80 | HR 77 | Temp 98.0°F | Wt 220.6 lb

## 2014-08-04 DIAGNOSIS — IMO0002 Reserved for concepts with insufficient information to code with codable children: Secondary | ICD-10-CM

## 2014-08-04 DIAGNOSIS — R079 Chest pain, unspecified: Secondary | ICD-10-CM

## 2014-08-04 DIAGNOSIS — E1165 Type 2 diabetes mellitus with hyperglycemia: Secondary | ICD-10-CM

## 2014-08-04 DIAGNOSIS — F411 Generalized anxiety disorder: Secondary | ICD-10-CM

## 2014-08-04 DIAGNOSIS — R8290 Unspecified abnormal findings in urine: Secondary | ICD-10-CM

## 2014-08-04 DIAGNOSIS — I10 Essential (primary) hypertension: Secondary | ICD-10-CM

## 2014-08-04 DIAGNOSIS — E785 Hyperlipidemia, unspecified: Secondary | ICD-10-CM

## 2014-08-04 LAB — CBC WITH DIFFERENTIAL/PLATELET
Basophils Absolute: 0.1 10*3/uL (ref 0.0–0.1)
Basophils Relative: 0.5 % (ref 0.0–3.0)
Eosinophils Absolute: 0.1 10*3/uL (ref 0.0–0.7)
Eosinophils Relative: 0.6 % (ref 0.0–5.0)
HCT: 42.7 % (ref 36.0–46.0)
Hemoglobin: 14.2 g/dL (ref 12.0–15.0)
Lymphocytes Relative: 31.9 % (ref 12.0–46.0)
Lymphs Abs: 3 10*3/uL (ref 0.7–4.0)
MCHC: 33.3 g/dL (ref 30.0–36.0)
MCV: 93.6 fl (ref 78.0–100.0)
Monocytes Absolute: 0.7 10*3/uL (ref 0.1–1.0)
Monocytes Relative: 7.7 % (ref 3.0–12.0)
Neutro Abs: 5.7 10*3/uL (ref 1.4–7.7)
Neutrophils Relative %: 59.3 % (ref 43.0–77.0)
Platelets: 236 10*3/uL (ref 150.0–400.0)
RBC: 4.57 Mil/uL (ref 3.87–5.11)
RDW: 12.9 % (ref 11.5–15.5)
WBC: 9.6 10*3/uL (ref 4.0–10.5)

## 2014-08-04 LAB — MICROALBUMIN / CREATININE URINE RATIO
Creatinine,U: 214.3 mg/dL
Microalb Creat Ratio: 1.6 mg/g (ref 0.0–30.0)
Microalb, Ur: 3.5 mg/dL — ABNORMAL HIGH (ref 0.0–1.9)

## 2014-08-04 LAB — LIPID PANEL
Cholesterol: 150 mg/dL (ref 0–200)
HDL: 49.8 mg/dL (ref >39.00)
LDL Cholesterol: 78 mg/dL (ref 0–99)
NonHDL: 100.2
Total CHOL/HDL Ratio: 3
Triglycerides: 109 mg/dL (ref 0.0–149.0)
VLDL: 21.8 mg/dL (ref 0.0–40.0)

## 2014-08-04 LAB — POCT URINALYSIS DIPSTICK
Bilirubin, UA: NEGATIVE
Blood, UA: NEGATIVE
Glucose, UA: NEGATIVE
Ketones, UA: NEGATIVE
Nitrite, UA: NEGATIVE
Spec Grav, UA: >=1.03
Urobilinogen, UA: NEGATIVE
pH, UA: 5.5

## 2014-08-04 LAB — BASIC METABOLIC PANEL
BUN: 13 mg/dL (ref 6–23)
CO2: 29 mEq/L (ref 19–32)
Calcium: 9.4 mg/dL (ref 8.4–10.5)
Chloride: 103 mEq/L (ref 96–112)
Creatinine, Ser: 0.73 mg/dL (ref 0.40–1.20)
GFR: 86.35 mL/min (ref >60.00)
Glucose, Bld: 208 mg/dL — ABNORMAL HIGH (ref 70–99)
Potassium: 4.2 mEq/L (ref 3.5–5.1)
Sodium: 137 mEq/L (ref 135–145)

## 2014-08-04 LAB — HEMOGLOBIN A1C: Hgb A1c MFr Bld: 8.5 % — ABNORMAL HIGH (ref 4.6–6.5)

## 2014-08-04 LAB — HEPATIC FUNCTION PANEL
ALT: 15 U/L (ref 0–35)
AST: 15 U/L (ref 0–37)
Albumin: 4.1 g/dL (ref 3.5–5.2)
Alkaline Phosphatase: 79 U/L (ref 39–117)
Bilirubin, Direct: 0.1 mg/dL (ref 0.0–0.3)
Total Bilirubin: 0.5 mg/dL (ref 0.2–1.2)
Total Protein: 7.3 g/dL (ref 6.0–8.3)

## 2014-08-04 MED ORDER — SERTRALINE HCL 100 MG PO TABS: 200.0000 mg | ORAL_TABLET | Freq: Every day | ORAL | Status: DC

## 2014-08-04 MED ORDER — METFORMIN HCL ER 500 MG PO TB24: 2000.0000 mg | ORAL_TABLET | Freq: Every day | ORAL | Status: DC

## 2014-08-04 MED ORDER — FENOFIBRATE 160 MG PO TABS: 160.0000 mg | ORAL_TABLET | Freq: Every day | ORAL | Status: DC

## 2014-08-04 MED ORDER — CLONAZEPAM 0.5 MG PO TABS: 0.5000 mg | ORAL_TABLET | Freq: Two times a day (BID) | ORAL | Status: DC | PRN

## 2014-08-04 MED ORDER — GLIPIZIDE ER 10 MG PO TB24: ORAL_TABLET | ORAL | Status: DC

## 2014-08-04 MED ORDER — ROSUVASTATIN CALCIUM 20 MG PO TABS: 30.0000 mg | ORAL_TABLET | Freq: Every day | ORAL | Status: DC

## 2014-08-04 MED ORDER — CLONAZEPAM 0.5 MG PO TABS: 0.5000 mg | ORAL_TABLET | Freq: Two times a day (BID) | ORAL | Status: DC

## 2014-08-04 MED ORDER — SAXAGLIPTIN HCL 5 MG PO TABS: 5.0000 mg | ORAL_TABLET | Freq: Every day | ORAL | Status: DC

## 2014-08-04 NOTE — Progress Notes (Signed)
Pre visit review using our clinic review tool, if applicable. No additional management support is needed unless otherwise documented below in the visit note. 

## 2014-08-04 NOTE — Patient Instructions (Addendum)
Generalized Anxiety Disorder Generalized anxiety disorder (GAD) is a mental disorder. It interferes with life functions, including relationships, work, and school. GAD is different from normal anxiety, which everyone experiences at some point in their lives in response to specific life events and activities. Normal anxiety actually helps us prepare for and get through these life events and activities. Normal anxiety goes away after the event or activity is over.  GAD causes anxiety that is not necessarily related to specific events or activities. It also causes excess anxiety in proportion to specific events or activities. The anxiety associated with GAD is also difficult to control. GAD can vary from mild to severe. People with severe GAD can have intense waves of anxiety with physical symptoms (panic attacks).  SYMPTOMS The anxiety and worry associated with GAD are difficult to control. This anxiety and worry are related to many life events and activities and also occur more days than not for 6 months or longer. People with GAD also have three or more of the following symptoms (one or more in children):  Restlessness.   Fatigue.  Difficulty concentrating.   Irritability.  Muscle tension.  Difficulty sleeping or unsatisfying sleep. DIAGNOSIS GAD is diagnosed through an assessment by your health care provider. Your health care provider will ask you questions aboutyour mood,physical symptoms, and events in your life. Your health care provider may ask you about your medical history and use of alcohol or drugs, including prescription medicines. Your health care provider may also do a physical exam and blood tests. Certain medical conditions and the use of certain substances can cause symptoms similar to those associated with GAD. Your health care provider may refer you to a mental health specialist for further evaluation. TREATMENT The following therapies are usually used to treat GAD:    Medication. Antidepressant medication usually is prescribed for long-term daily control. Antianxiety medicines may be added in severe cases, especially when panic attacks occur.   Talk therapy (psychotherapy). Certain types of talk therapy can be helpful in treating GAD by providing support, education, and guidance. A form of talk therapy called cognitive behavioral therapy can teach you healthy ways to think about and react to daily life events and activities.  Stress managementtechniques. These include yoga, meditation, and exercise and can be very helpful when they are practiced regularly. A mental health specialist can help determine which treatment is best for you. Some people see improvement with one therapy. However, other people require a combination of therapies. Document Released: 10/28/2012 Document Revised: 11/17/2013 Document Reviewed: 10/28/2012 ExitCare Patient Information 2015 [employer]. This information is not intended to replace advice given to you by your health care provider. Make sure you discuss any questions you have with your health care provider.  

## 2014-08-04 NOTE — Progress Notes (Signed)
Subjective:    Patient ID: Donna Pham, female    DOB: 01-15-1956, 59 y.o.   MRN: [id number]  HPI  Pt is here c/o chest pain and inc stress.  Her grandson in [place] was in bar fight and had to have surgery on his eye.  They still don't know how he is.  She has had cp since.  No sob.  Pt also c.o of sugars being elevated she is frustrated and pharmacy told her to stop one of the meds and only take glucophage because she was on "too much medicine" So pt stopped the bromocryptine.    Past Medical History  Diagnosis Date  . Depression   . Diabetes mellitus   . GERD (gastroesophageal reflux disease)   . Hyperlipidemia   . Hypertension   . CTS (carpal tunnel syndrome)     6/15-19/09 RLL PNA with sepsis high LFTs (ALT 61)  . Arthritis   . Right upper quadrant abdominal pain    History   Social History  . Marital Status: Married    Spouse Name: N/A    Number of Children: N/A  . Years of Education: N/A   Occupational History  . Retired    Social History Main Topics  . Smoking status: Never Smoker   . Smokeless tobacco: Never Used  . Alcohol Use: No  . Drug Use: No  . Sexual Activity: Not on file   Other Topics Concern  . Not on file   Social History Narrative   No diet    Regular exercise-no   Originally from [place]   Current Outpatient Prescriptions  Medication Sig Dispense Refill  . albuterol (PROVENTIL HFA;VENTOLIN HFA) 108 (90 BASE) MCG/ACT inhaler Inhale 2 puffs into the lungs every 6 (six) hours as needed for wheezing or shortness of breath. 1 Inhaler 0  . albuterol (PROVENTIL) (2.5 MG/3ML) 0.083% nebulizer solution Take 3 mLs (2.5 mg total) by nebulization 4 (four) times daily as needed for wheezing. 150 mL 1  . ALPRAZolam (XANAX) 0.25 MG tablet Take 1 tablet (0.25 mg total) by mouth 3 (three) times daily as needed. anxiety 30 tablet 0  . amoxicillin (AMOXIL) 875 MG tablet Take 1 tablet (875 mg total) by mouth 2 (two) times daily. 20 tablet 0  . aspirin 81 MG  tablet Take 81 mg by mouth daily.      Marland Kitchen azithromycin (ZITHROMAX) 250 MG tablet 2 tab po day 1 then 1 tab po x 4 days 6 tablet 0  . beclomethasone (QVAR) 40 MCG/ACT inhaler Inhale 2 puffs into the lungs 2 (two) times daily. 1 Inhaler 2  . benzonatate (TESSALON) 100 MG capsule Take 1 capsule (100 mg total) by mouth 3 (three) times daily as needed for cough. 21 capsule 0  . betamethasone dipropionate (DIPROLENE) 0.05 % cream Apply topically 2 (two) times daily. Use for 10 days at the arms for poison ivy 30 g 0  . budesonide-formoterol (SYMBICORT) 80-4.5 MCG/ACT inhaler Inhale 2 puffs into the lungs 2 (two) times daily. 1 Inhaler 3  . clonazePAM (KLONOPIN) 0.5 MG tablet Take 1 tablet (0.5 mg total) by mouth 2 (two) times daily. for anxiety 60 tablet 2  . CYCLOSET 0.8 MG TABS TAKE 3 TABLETS BY MOUTH FIRST THING IN THE MORNING 90 tablet 0  . esomeprazole (NEXIUM) 40 MG capsule Take 1 capsule (40 mg total) by mouth daily before breakfast. 90 capsule 3  . fenofibrate 160 MG tablet Take 1 tablet (160 mg total) by mouth daily.  90 tablet 1  . glipiZIDE (GLUCOTROL XL) 10 MG 24 hr tablet Please take 1x a day in am 90 tablet 3  . guaiFENesin-codeine (ROBITUSSIN AC) 100-10 MG/5ML syrup Take 10 mLs by mouth 3 (three) times daily as needed for cough. 240 mL 0  . metFORMIN (GLUCOPHAGE XR) 500 MG 24 hr tablet Take 4 tablets (2,000 mg total) by mouth daily with supper. 360 tablet 3  . ondansetron (ZOFRAN ODT) 8 MG disintegrating tablet Take 1 tablet (8 mg total) by mouth every 8 (eight) hours as needed for nausea. 20 tablet 0  . pramoxine-hydrocortisone 1-1 % foam Apply 1 application topically 3 (three) times daily. 10 g 2  . rosuvastatin (CRESTOR) 20 MG tablet Take 1.5 tablets (30 mg total) by mouth daily. 135 tablet 0  . saxagliptin HCl (ONGLYZA) 5 MG TABS tablet Take 1 tablet (5 mg total) by mouth daily. 90 tablet 1  . sertraline (ZOLOFT) 100 MG tablet Take 2 tablets (200 mg total) by mouth daily. 180 tablet 1  .  terconazole (TERAZOL 7) 0.4 % vaginal cream Use externally for up to 7 days 45 g 0   No current facility-administered medications for this visit.     Review of Systems  Constitutional: Negative for fever, chills, diaphoresis, activity change, appetite change, fatigue and unexpected weight change.  Respiratory: Negative for apnea, cough, choking, chest tightness, shortness of breath, wheezing and stridor.   Cardiovascular: Positive for chest pain. Negative for palpitations and leg swelling.  Endocrine: Negative for polydipsia, polyphagia and polyuria.  Psychiatric/Behavioral: Negative for suicidal ideas, sleep disturbance, self-injury and dysphoric mood. The patient is nervous/anxious.        Objective:   Physical Exam BP 112/80 mmHg  Pulse 77  Temp(Src) 98 F (36.7 C) (Oral)  Wt 220 lb 9.6 oz (100.064 kg)  SpO2 96% General appearance: alert, cooperative, appears stated age and no distress Lungs: clear to auscultation bilaterally Heart: regular rate and rhythm, S1, S2 normal, no murmur, click, rub or gallop Extremities: extremities normal, atraumatic, no cyanosis or edema Neurologic: Alert and oriented X 3, normal strength and tone. Normal symmetric reflexes. Normal coordination and gait       Assessment & Plan:  1. Chest pain, unspecified chest pain type prob from anxiety - EKG 12-Lead  2. Generalized anxiety disorder Pt was not taking klonopin or xanax - clonazePAM (KLONOPIN) 0.5 MG tablet; Take 1 tablet (0.5 mg total) by mouth 2 (two) times daily. for anxiety  Dispense: 60 tablet; Refill: 2 - sertraline (ZOLOFT) 100 MG tablet; Take 2 tablets (200 mg total) by mouth daily.  Dispense: 180 tablet; Refill: 1  3. Diabetes mellitus type II, uncontrolled Check labs, con't meds,   F/u endo - Basic metabolic panel - CBC with Differential - Hemoglobin A1c - Hepatic function panel - Lipid panel - Microalbumin / creatinine urine ratio - POCT urinalysis dipstick - saxagliptin  HCl (ONGLYZA) 5 MG TABS tablet; Take 1 tablet (5 mg total) by mouth daily.  Dispense: 90 tablet; Refill: 1 - metFORMIN (GLUCOPHAGE XR) 500 MG 24 hr tablet; Take 4 tablets (2,000 mg total) by mouth daily with supper.  Dispense: 360 tablet; Refill: 3 - glipiZIDE (GLUCOTROL XL) 10 MG 24 hr tablet; Please take 1x a day in am  Dispense: 90 tablet; Refill: 3  4. Hyperlipidemia Check labs - Hepatic function panel - Lipid panel - rosuvastatin (CRESTOR) 20 MG tablet; Take 1.5 tablets (30 mg total) by mouth daily.  Dispense: 135 tablet; Refill: 0 -  fenofibrate 160 MG tablet; Take 1 tablet (160 mg total) by mouth daily.  Dispense: 90 tablet; Refill: 1  5. Essential hypertension Stable, cont med - Basic metabolic panel - CBC with Differential - Hemoglobin A1c - Hepatic function panel - Lipid panel - Microalbumin / creatinine urine ratio - POCT urinalysis dipstick  6. Abnormal urine  - Urine Culture

## 2014-08-05 LAB — URINE CULTURE: Colony Count: >=100000

## 2014-08-10 ENCOUNTER — Other Ambulatory Visit: Payer: Self-pay

## 2014-08-10 MED ORDER — ACCU-CHEK FASTCLIX LANCETS MISC: Status: DC

## 2014-08-10 MED ORDER — ACCU-CHEK NANO SMARTVIEW W/DEVICE KIT: PACK | Status: DC

## 2014-08-10 MED ORDER — GLUCOSE BLOOD VI STRP
ORAL_STRIP | Status: DC
Start: 2014-08-10 — End: 2015-07-21

## 2014-08-10 MED ORDER — BD SWAB SINGLE USE REGULAR PADS: MEDICATED_PAD | Status: AC

## 2014-08-12 ENCOUNTER — Encounter: Payer: Self-pay | Admitting: Internal Medicine

## 2014-08-12 ENCOUNTER — Ambulatory Visit (INDEPENDENT_AMBULATORY_CARE_PROVIDER_SITE_OTHER): Payer: Commercial Managed Care - HMO | Admitting: Internal Medicine

## 2014-08-12 ENCOUNTER — Telehealth: Payer: Self-pay | Admitting: Family Medicine

## 2014-08-12 VITALS — BP 118/80 | HR 88 | Temp 98.0°F | Resp 12 | Wt 218.0 lb

## 2014-08-12 DIAGNOSIS — E1165 Type 2 diabetes mellitus with hyperglycemia: Secondary | ICD-10-CM

## 2014-08-12 DIAGNOSIS — IMO0001 Reserved for inherently not codable concepts without codable children: Secondary | ICD-10-CM

## 2014-08-12 MED ORDER — INSULIN PEN NEEDLE 32G X 4 MM MISC: Status: DC

## 2014-08-12 MED ORDER — INSULIN DEGLUDEC 200 UNIT/ML ~~LOC~~ SOPN: 14.0000 [IU] | PEN_INJECTOR | Freq: Every day | SUBCUTANEOUS | Status: DC

## 2014-08-12 NOTE — Progress Notes (Signed)
Patient ID: Donna Pham, female   DOB: 1956/07/17, 59 y.o.   MRN: [id number]  HPI: Donna Pham is a 59 y.o.-year-old [place] female, returning for f/u for DM2, dx 2000, insulin-dependent, uncontrolled, without complications. Last visit was 6 mo ago.  She had a lot of stress recently >> grandson got into a fight in [place] >> eye surgery.  Last hemoglobin A1c was: Lab Results  Component Value Date   HGBA1C 8.5* 08/04/2014   HGBA1C 7.9* 02/23/2014   HGBA1C 7.8* 02/02/2014   Pt is on a regimen of: - Metformin XR 1500 >> 1000 mg 2x a day - Glipizide XL 10 mg - started 10/21/2013 - Onglyza 5 mg daily Invokana 100 mg >> had 3 yeast infections since last visit >> we stopped it. She also stopped Cycloset 3 weeks ago (did not get it from pharmacy). She tried Avandia in the past.  She tried Actos in the past.   Pt checks her sugars 2-3x a day - higher - no log, no meter!!! - am: 120-170 >> 113-172 (140-160) >> 64, 74 (after taking dog for a walk), 140-180 - 2h after b'fast: n/c >> 124-270 (140-190) >> 160-220 - before lunch: n/c >> 194, 303 >> 160-170 - 2h after lunch: 170-240 >> 156-250 >> 180 - before dinner: n/c >> 164, 178, 220 >> 160-250  - 2h after dinner: (139)160-260 (299) >> 160-254 >> 160 - nighttime: n/c >> 124-230 >> n/c  She eats more fruit and bread lately. Pt's meals are: - Breakfast: coffee + waffle + PB; blueberry - Lunch: chicken noodle soup, stews - Dinner: fried chicken - Snack: 1 She walks the dog daily.   - no CKD, last BUN/creatinine:  Lab Results  Component Value Date   BUN 13 08/04/2014   CREATININE 0.73 08/04/2014   - last set of lipids: Lab Results  Component Value Date   CHOL 150 08/04/2014   HDL 49.80 08/04/2014   LDLCALC 78 08/04/2014   LDLDIRECT 167.2 08/22/2010   TRIG 109.0 08/04/2014   CHOLHDL 3 08/04/2014  On Crestor and fenofibrate. - last eye exam was in 08/2013 >> cataracts >> will have Sx. No DR.  - no numbness and tingling  in her feet.  She also has a history of HTN, HL, h/o cholecystitis, fatty liver.  I reviewed pt's medications, allergies, PMH, social hx, family hx, and changes were documented in the history of present illness. Otherwise, unchanged from my initial visit note.  ROS: Constitutional: no weight gain/loss, no fatigue, no subjective hyperthermia/hypothermia Eyes: + blurry vision, no xerophthalmia ENT: no sore throat, no nodules palpated in throat, no dysphagia/odynophagia, no hoarseness Cardiovascular: no CP/SOB/palpitations/leg swelling Respiratory: no cough/SOB Gastrointestinal: no N/V/D/C/+ heartburn Musculoskeletal: no muscle/joint aches Skin: no rashes Neurological: no tremors/numbness/tingling/dizziness  PE: BP 118/80 mmHg  Pulse 88  Temp(Src) 98 F (36.7 C) (Oral)  Resp 12  Wt 218 lb (98.884 kg)  SpO2 95% Wt Readings from Last 3 Encounters:  08/12/14 218 lb (98.884 kg)  08/04/14 220 lb 9.6 oz (100.064 kg)  06/03/14 216 lb 8 oz (98.204 kg)   Constitutional: obese, in NAD Eyes: PERRLA, EOMI, no exophthalmos ENT: moist mucous membranes, no thyromegaly, no cervical lymphadenopathy Cardiovascular: RRR, No MRG Respiratory: CTA B Gastrointestinal: abdomen soft, NT, ND, BS+ Musculoskeletal: no deformities, strength intact in all 4 Skin: moist, warm, no rashes Neurological: no tremor with outstretched hands, DTR normal in all 4  ASSESSMENT: 1. DM2, insulin-dependent, uncontrolled, without complications  PLAN:  1. Patient with long-standing,  uncontrolled diabetes, on oral antidiabetic regimen, which is insufficient. In fact, her sugars are higher now with the holidays and I believe that she needs basal insulin at this point. We decided to try Tresiba U200, since it is covered by [facility] and also because of the flexibility in dosing time. I explained that she will start seeing improvement in her blood sugars in 3-4 days. [name] (diabetes educator) demonstrated pen use and  correct injection technique. Patient Instructions  Please continue: - Metformin XR 1000 mg 2x a day - Glipizide XL 10 mg in am - Onglyza 5 mg daily  Please start Tresiba U200 insulin 14 units at bedtime. Please increase to 16 units in 4 days if sugars in am are not <130.  Please return in 1 month with your sugar log.   - she needs to work on her diet! - continue checking sugars at different times of the day - check 2-3 times a day, rotating checks - up to date with eye exams - No labs needed today.  - I refilled her Glipizide XL. - Return to clinic in 1 mo with sugar log

## 2014-08-12 NOTE — Patient Instructions (Signed)
Please continue: - Metformin XR 1000 mg 2x a day - Glipizide XL 10 mg in am - Onglyza 5 mg daily  Please start Tresiba U200 insulin 14 units at bedtime. Please increase to 16 units in 4 days if sugars in am are not <130.  Please return in 1 month with your sugar log.

## 2014-08-12 NOTE — Telephone Encounter (Signed)
Patient says you called her and she would like you to call again

## 2014-11-11 ENCOUNTER — Other Ambulatory Visit (INDEPENDENT_AMBULATORY_CARE_PROVIDER_SITE_OTHER): Payer: Commercial Managed Care - HMO

## 2014-11-11 ENCOUNTER — Other Ambulatory Visit: Payer: Self-pay | Admitting: *Deleted

## 2014-11-11 ENCOUNTER — Other Ambulatory Visit: Payer: Commercial Managed Care - HMO

## 2014-11-11 DIAGNOSIS — IMO0001 Reserved for inherently not codable concepts without codable children: Secondary | ICD-10-CM

## 2014-11-11 DIAGNOSIS — E1165 Type 2 diabetes mellitus with hyperglycemia: Principal | ICD-10-CM

## 2014-11-11 LAB — HEMOGLOBIN A1C: Hgb A1c MFr Bld: 7.2 % — ABNORMAL HIGH (ref 4.6–6.5)

## 2014-11-12 ENCOUNTER — Telehealth: Payer: Self-pay | Admitting: Family Medicine

## 2014-11-12 DIAGNOSIS — Z0100 Encounter for examination of eyes and vision without abnormal findings: Secondary | ICD-10-CM

## 2014-11-12 NOTE — Telephone Encounter (Signed)
Please advise      KP 

## 2014-11-12 NOTE — Telephone Encounter (Signed)
Caller name: Roughton,Clementina Relation to pt: spouse  Call back number: [phone]   Reason for call:  Requesting a referral to Dr. Virgina Evener [id number], [place], [place] [address] [phone] due to cataracts.

## 2014-11-12 NOTE — Telephone Encounter (Signed)
Ok to referral to [employer]

## 2014-11-12 NOTE — Telephone Encounter (Signed)
Ref placed.      KP 

## 2014-11-16 ENCOUNTER — Encounter: Payer: Self-pay | Admitting: Family Medicine

## 2014-12-18 LAB — HM DIABETES EYE EXAM

## 2014-12-21 LAB — HM DIABETES EYE EXAM

## 2014-12-29 ENCOUNTER — Encounter: Payer: Self-pay | Admitting: Internal Medicine

## 2015-01-08 ENCOUNTER — Encounter: Payer: Self-pay | Admitting: Family Medicine

## 2015-01-08 ENCOUNTER — Ambulatory Visit (INDEPENDENT_AMBULATORY_CARE_PROVIDER_SITE_OTHER): Payer: Commercial Managed Care - HMO | Admitting: Family Medicine

## 2015-01-08 VITALS — BP 120/70 | HR 73 | Temp 97.8°F

## 2015-01-08 DIAGNOSIS — I10 Essential (primary) hypertension: Secondary | ICD-10-CM | POA: Diagnosis not present

## 2015-01-08 DIAGNOSIS — R32 Unspecified urinary incontinence: Secondary | ICD-10-CM | POA: Diagnosis not present

## 2015-01-08 DIAGNOSIS — E119 Type 2 diabetes mellitus without complications: Secondary | ICD-10-CM

## 2015-01-08 DIAGNOSIS — E785 Hyperlipidemia, unspecified: Secondary | ICD-10-CM

## 2015-01-08 DIAGNOSIS — E1165 Type 2 diabetes mellitus with hyperglycemia: Secondary | ICD-10-CM

## 2015-01-08 DIAGNOSIS — IMO0001 Reserved for inherently not codable concepts without codable children: Secondary | ICD-10-CM

## 2015-01-08 DIAGNOSIS — R8290 Unspecified abnormal findings in urine: Secondary | ICD-10-CM

## 2015-01-08 LAB — BASIC METABOLIC PANEL
BUN: 14 mg/dL (ref 6–23)
CO2: 33 mEq/L — ABNORMAL HIGH (ref 19–32)
Calcium: 9.5 mg/dL (ref 8.4–10.5)
Chloride: 100 mEq/L (ref 96–112)
Creatinine, Ser: 0.78 mg/dL (ref 0.40–1.20)
GFR: 79.88 mL/min (ref >60.00)
Glucose, Bld: 128 mg/dL — ABNORMAL HIGH (ref 70–99)
Potassium: 4.7 mEq/L (ref 3.5–5.1)
Sodium: 138 mEq/L (ref 135–145)

## 2015-01-08 LAB — LIPID PANEL
Cholesterol: 159 mg/dL (ref 0–200)
HDL: 48.6 mg/dL (ref >39.00)
LDL Cholesterol: 81 mg/dL (ref 0–99)
NonHDL: 110.4
Total CHOL/HDL Ratio: 3
Triglycerides: 147 mg/dL (ref 0.0–149.0)
VLDL: 29.4 mg/dL (ref 0.0–40.0)

## 2015-01-08 LAB — POCT URINALYSIS DIPSTICK
Bilirubin, UA: NEGATIVE
Blood, UA: NEGATIVE
Glucose, UA: NEGATIVE
Ketones, UA: NEGATIVE
Nitrite, UA: NEGATIVE
Protein, UA: NEGATIVE
Spec Grav, UA: 1.025
Urobilinogen, UA: 0.2
pH, UA: 6

## 2015-01-08 LAB — CBC WITH DIFFERENTIAL/PLATELET
Basophils Absolute: 0 10*3/uL (ref 0.0–0.1)
Basophils Relative: 0.3 % (ref 0.0–3.0)
Eosinophils Absolute: 0 10*3/uL (ref 0.0–0.7)
Eosinophils Relative: 0.3 % (ref 0.0–5.0)
HCT: 42.9 % (ref 36.0–46.0)
Hemoglobin: 14.4 g/dL (ref 12.0–15.0)
Lymphocytes Relative: 33.5 % (ref 12.0–46.0)
Lymphs Abs: 3.4 10*3/uL (ref 0.7–4.0)
MCHC: 33.5 g/dL (ref 30.0–36.0)
MCV: 93.2 fl (ref 78.0–100.0)
Monocytes Absolute: 0.9 10*3/uL (ref 0.1–1.0)
Monocytes Relative: 8.7 % (ref 3.0–12.0)
Neutro Abs: 5.8 10*3/uL (ref 1.4–7.7)
Neutrophils Relative %: 57.2 % (ref 43.0–77.0)
Platelets: 245 10*3/uL (ref 150.0–400.0)
RBC: 4.6 Mil/uL (ref 3.87–5.11)
RDW: 12.7 % (ref 11.5–15.5)
WBC: 10.1 10*3/uL (ref 4.0–10.5)

## 2015-01-08 LAB — HEPATIC FUNCTION PANEL
ALT: 14 U/L (ref 0–35)
AST: 16 U/L (ref 0–37)
Albumin: 4.1 g/dL (ref 3.5–5.2)
Alkaline Phosphatase: 82 U/L (ref 39–117)
Bilirubin, Direct: 0 mg/dL (ref 0.0–0.3)
Total Bilirubin: 0.5 mg/dL (ref 0.2–1.2)
Total Protein: 7.4 g/dL (ref 6.0–8.3)

## 2015-01-08 LAB — HEMOGLOBIN A1C: Hgb A1c MFr Bld: 7.4 % — ABNORMAL HIGH (ref 4.6–6.5)

## 2015-01-08 LAB — MICROALBUMIN / CREATININE URINE RATIO
Creatinine,U: 109.7 mg/dL
Microalb Creat Ratio: 0.6 mg/g (ref 0.0–30.0)
Microalb, Ur: <0.7 mg/dL (ref 0.0–1.9)

## 2015-01-08 MED ORDER — MIRABEGRON ER 50 MG PO TB24: 50.0000 mg | ORAL_TABLET | Freq: Every day | ORAL | Status: DC

## 2015-01-08 NOTE — Progress Notes (Signed)
Pre visit review using our clinic review tool, if applicable. No additional management support is needed unless otherwise documented below in the visit note. 

## 2015-01-08 NOTE — Assessment & Plan Note (Signed)
con't crestor and fenofibrate Check labs

## 2015-01-08 NOTE — Assessment & Plan Note (Signed)
Per endo Pt asked that we do labs today

## 2015-01-08 NOTE — Progress Notes (Signed)
Patient ID: Donna Pham, female    DOB: 22-Mar-1956  Age: 59 y.o. MRN: [id number]    Subjective:  Subjective HPI Donna Pham presents for f/u dm, htn, hyperlipidemia She also c/o urine incontinence.  She is asking for a medication to help this. HPI HYPERTENSION  Blood pressure range-not checking   Chest pain- no      Dyspnea- no Lightheadedness- no   Edema- no Other side effects - no   Medication compliance: good Low salt diet- yes  DIABETES  Blood Sugar ranges-high lately----sees endo  Polyuria- no New Visual problems- no Hypoglycemic symptoms- no Other side effects-no Medication compliance - good Last eye exam- 3 weeks ago Foot exam- today  HYPERLIPIDEMIA  Medication compliance- good RUQ pain- no  Muscle aches- no Other side effects-no      Review of Systems  Constitutional: Negative for activity change, appetite change, fatigue and unexpected weight change.  Respiratory: Negative for cough and shortness of breath.   Cardiovascular: Negative for chest pain and palpitations.  Psychiatric/Behavioral: Negative for behavioral problems and dysphoric mood. The patient is not nervous/anxious.     History Past Medical History  Diagnosis Date  . Depression   . Diabetes mellitus   . GERD (gastroesophageal reflux disease)   . Hyperlipidemia   . Hypertension   . CTS (carpal tunnel syndrome)     6/15-19/09 RLL PNA with sepsis high LFTs (ALT 61)  . Arthritis   . Right upper quadrant abdominal pain     She has no past surgical history on file.   Her family history includes Arthritis in her father; Cancer in her father; Diabetes in her mother.She reports that she has never smoked. She has never used smokeless tobacco. She reports that she does not drink alcohol or use illicit drugs.  Current Outpatient Prescriptions on File Prior to Visit  Medication Sig Dispense Refill  . ACCU-CHEK FASTCLIX LANCETS MISC Check blood sugar twice daily 200 each 3  . albuterol  (PROVENTIL HFA;VENTOLIN HFA) 108 (90 BASE) MCG/ACT inhaler Inhale 2 puffs into the lungs every 6 (six) hours as needed for wheezing or shortness of breath. 1 Inhaler 0  . albuterol (PROVENTIL) (2.5 MG/3ML) 0.083% nebulizer solution Take 3 mLs (2.5 mg total) by nebulization 4 (four) times daily as needed for wheezing. 150 mL 1  . Alcohol Swabs (B-D SINGLE USE SWABS REGULAR) PADS Check blood sugar twice daily 200 each 3  . ALPRAZolam (XANAX) 0.25 MG tablet Take 1 tablet (0.25 mg total) by mouth 3 (three) times daily as needed. anxiety 30 tablet 0  . aspirin 81 MG tablet Take 81 mg by mouth daily.      . beclomethasone (QVAR) 40 MCG/ACT inhaler Inhale 2 puffs into the lungs 2 (two) times daily. 1 Inhaler 2  . betamethasone dipropionate (DIPROLENE) 0.05 % cream Apply topically 2 (two) times daily. Use for 10 days at the arms for poison ivy 30 g 0  . Blood Glucose Monitoring Suppl (ACCU-CHEK NANO SMARTVIEW) W/DEVICE KIT Check blood sugar as directed 1 kit 0  . budesonide-formoterol (SYMBICORT) 80-4.5 MCG/ACT inhaler Inhale 2 puffs into the lungs 2 (two) times daily. 1 Inhaler 3  . clonazePAM (KLONOPIN) 0.5 MG tablet Take 1 tablet (0.5 mg total) by mouth 2 (two) times daily. for anxiety 60 tablet 2  . esomeprazole (NEXIUM) 40 MG capsule Take 1 capsule (40 mg total) by mouth daily before breakfast. 90 capsule 3  . fenofibrate 160 MG tablet Take 1 tablet (160 mg total) by mouth  daily. 90 tablet 1  . glipiZIDE (GLUCOTROL XL) 10 MG 24 hr tablet Please take 1x a day in am 90 tablet 3  . glucose blood (ACCU-CHEK SMARTVIEW) test strip Check blood sugar twice a day 200 each 3  . Insulin Degludec (TRESIBA FLEXTOUCH) 200 UNIT/ML SOPN Inject 14 Units into the skin at bedtime. 5 pen 2  . Insulin Pen Needle (CAREFINE PEN NEEDLES) 32G X 4 MM MISC Use 1x a day 100 each 2  . metFORMIN (GLUCOPHAGE XR) 500 MG 24 hr tablet Take 4 tablets (2,000 mg total) by mouth daily with supper. 360 tablet 3  . ondansetron (ZOFRAN ODT) 8  MG disintegrating tablet Take 1 tablet (8 mg total) by mouth every 8 (eight) hours as needed for nausea. 20 tablet 0  . pramoxine-hydrocortisone 1-1 % foam Apply 1 application topically 3 (three) times daily. 10 g 2  . rosuvastatin (CRESTOR) 20 MG tablet Take 1.5 tablets (30 mg total) by mouth daily. 135 tablet 0  . saxagliptin HCl (ONGLYZA) 5 MG TABS tablet Take 1 tablet (5 mg total) by mouth daily. 90 tablet 1  . sertraline (ZOLOFT) 100 MG tablet Take 2 tablets (200 mg total) by mouth daily. 180 tablet 1  . terconazole (TERAZOL 7) 0.4 % vaginal cream Use externally for up to 7 days 45 g 0   No current facility-administered medications on file prior to visit.     Objective:  Objective Physical Exam  Constitutional: She is oriented to person, place, and time. She appears well-developed and well-nourished.  HENT:  Head: Normocephalic and atraumatic.  Eyes: Conjunctivae and EOM are normal.  Neck: Normal range of motion. Neck supple. No JVD present. Carotid bruit is not present. No thyromegaly present.  Cardiovascular: Normal rate, regular rhythm and normal heart sounds.   No murmur heard. Pulmonary/Chest: Effort normal and breath sounds normal. No respiratory distress. She has no wheezes. She has no rales. She exhibits no tenderness.  Musculoskeletal: She exhibits no edema.  Neurological: She is alert and oriented to person, place, and time.  Psychiatric: She has a normal mood and affect. Her behavior is normal.  Sensory exam of the foot is normal, tested with the monofilament. Good pulses, no lesions or ulcers, good peripheral pulses.  BP 120/70 mmHg  Pulse 73  Temp(Src) 97.8 F (36.6 C) (Oral)  SpO2 97% Wt Readings from Last 3 Encounters:  08/12/14 218 lb (98.884 kg)  08/04/14 220 lb 9.6 oz (100.064 kg)  06/03/14 216 lb 8 oz (98.204 kg)     Lab Results  Component Value Date   WBC 9.6 08/04/2014   HGB 14.2 08/04/2014   HCT 42.7 08/04/2014   PLT 236.0 08/04/2014   GLUCOSE  208* 08/04/2014   CHOL 150 08/04/2014   TRIG 109.0 08/04/2014   HDL 49.80 08/04/2014   LDLDIRECT 167.2 08/22/2010   LDLCALC 78 08/04/2014   ALT 15 08/04/2014   AST 15 08/04/2014   NA 137 08/04/2014   K 4.2 08/04/2014   CL 103 08/04/2014   CREATININE 0.73 08/04/2014   BUN 13 08/04/2014   CO2 29 08/04/2014   TSH 0.63 01/28/2013   HGBA1C 7.2* 11/11/2014   MICROALBUR 3.5* 08/04/2014    Mm Digital Diagnostic Bilat  05/09/2013   CLINICAL DATA:  Diffuse left breast pain.  EXAM: DIGITAL DIAGNOSTIC  BILATERAL MAMMOGRAM  COMPARISON:  Previous exams.  ACR Breast Density Category b: There are scattered areas of fibroglandular density.  FINDINGS: No suspicious masses or calcifications are seen in  either breast. An initially questioned asymmetry in the upper right breast seen on the MLO view only resolves on the additional imaging compatible with superimposed breast tissue. There is no mammographic evidence of malignancy in either breast.  IMPRESSION: No mammographic evidence of malignancy in either breast.  RECOMMENDATION: 1. Recommend further management of diffuse left breast pain be based on clinical grounds.  2. Screening mammogram in one year.(Code:SM-B-01Y)  I have discussed the findings and recommendations with the patient. Results were also provided in writing at the conclusion of the visit. If applicable, a reminder letter will be sent to the patient regarding the next appointment.  BI-RADS CATEGORY  1: Negative   Electronically Signed   By: Everlean Alstrom M.D.   On: 05/09/2013 15:47     Assessment & Plan:  Plan I have discontinued Ms. Diehl azithromycin, benzonatate, amoxicillin, and guaiFENesin-codeine. I am also having her start on mirabegron ER. Additionally, I am having her maintain her aspirin, pramoxine-hydrocortisone, budesonide-formoterol, albuterol, ondansetron, ALPRAZolam, terconazole, betamethasone dipropionate, esomeprazole, beclomethasone, albuterol, clonazePAM, saxagliptin  HCl, sertraline, rosuvastatin, metFORMIN, glipiZIDE, fenofibrate, ACCU-CHEK NANO SMARTVIEW, glucose blood, ACCU-CHEK FASTCLIX LANCETS, B-D SINGLE USE SWABS REGULAR, Insulin Degludec, and Insulin Pen Needle.  Meds ordered this encounter  Medications  . mirabegron ER (MYRBETRIQ) 50 MG TB24 tablet    Sig: Take 1 tablet (50 mg total) by mouth daily.    Dispense:  30 tablet    Refill:  5    Problem List Items Addressed This Visit    None    Visit Diagnoses    Essential hypertension    -  Primary    Relevant Orders    Basic metabolic panel    Hepatic function panel    Lipid panel    Microalbumin / creatinine urine ratio    POCT urinalysis dipstick (Completed)    CBC with Differential/Platelet    Hemoglobin A1c    Hyperlipidemia        Relevant Orders    Basic metabolic panel    Hepatic function panel    Lipid panel    Microalbumin / creatinine urine ratio    POCT urinalysis dipstick (Completed)    CBC with Differential/Platelet    Hemoglobin A1c    DM II (diabetes mellitus, type II), controlled        Relevant Orders    Basic metabolic panel    Hepatic function panel    Lipid panel    Microalbumin / creatinine urine ratio    POCT urinalysis dipstick (Completed)    CBC with Differential/Platelet    Hemoglobin A1c    Incontinence        Relevant Medications    mirabegron ER (MYRBETRIQ) 50 MG TB24 tablet    Abnormal urine        Relevant Orders    CULTURE, URINE COMPREHENSIVE       Follow-up: Return in about 6 months (around 07/10/2015) for hypertension, hyperlipidemia, diabetes II, annual exam.  Garnet Koyanagi, DO

## 2015-01-08 NOTE — Patient Instructions (Signed)

## 2015-01-11 LAB — CULTURE, URINE COMPREHENSIVE: Colony Count: >=100000

## 2015-01-11 MED ORDER — AMOXICILLIN 875 MG PO TABS: 875.0000 mg | ORAL_TABLET | Freq: Two times a day (BID) | ORAL | Status: DC

## 2015-01-25 ENCOUNTER — Other Ambulatory Visit: Payer: Self-pay | Admitting: Family Medicine

## 2015-01-26 ENCOUNTER — Telehealth: Payer: Self-pay | Admitting: Family Medicine

## 2015-01-26 MED ORDER — FLUCONAZOLE 150 MG PO TABS: 150.0000 mg | ORAL_TABLET | Freq: Once | ORAL | Status: DC

## 2015-01-26 NOTE — Telephone Encounter (Signed)
Diflucan 150 mg #2  1 po qd x 1, may repeat in 3 days prn  

## 2015-01-26 NOTE — Telephone Encounter (Signed)
Please advise      KP 

## 2015-01-26 NOTE — Telephone Encounter (Signed)
Caller name: Letita Relation to pt: self Call back number: [phone] Pharmacy: [facility] on [address]  Reason for call:   Patient states that the pencillin has given her a yeast infection and would like something called in for this.

## 2015-01-26 NOTE — Telephone Encounter (Signed)
Rx faxed and the patient has been made aware.     KP 

## 2015-03-10 ENCOUNTER — Telehealth: Payer: Self-pay | Admitting: Family Medicine

## 2015-03-10 NOTE — Telephone Encounter (Signed)
Caller name: Donna Pham  Relationship to patient: self  Can be reached: [phone]  Pharmacy:  Reason for call: pt says that she is going out of town to [place] on 10/2. Pt says that the last time that she went to [place] the Dr. Hanley Seamen her a antibiotic. Pt would like to know if she need it again before going.

## 2015-03-10 NOTE — Telephone Encounter (Signed)
Pt also states she is having low back pain and the last time that occurred she had a UTI.  Offered pt appt today but she declines. States she only wants to see Dr Etter Sjogren. Advised her to call back in the morning to request appt with Dr Etter Sjogren as only slots available are for Doc of the Day or same day. Pt voices understanding.

## 2015-03-11 ENCOUNTER — Ambulatory Visit (INDEPENDENT_AMBULATORY_CARE_PROVIDER_SITE_OTHER): Payer: Commercial Managed Care - HMO | Admitting: Family Medicine

## 2015-03-11 ENCOUNTER — Encounter: Payer: Self-pay | Admitting: Family Medicine

## 2015-03-11 VITALS — BP 114/70 | HR 95 | Temp 98.2°F | Wt 223.0 lb

## 2015-03-11 DIAGNOSIS — M545 Low back pain: Secondary | ICD-10-CM

## 2015-03-11 DIAGNOSIS — M5442 Lumbago with sciatica, left side: Secondary | ICD-10-CM

## 2015-03-11 DIAGNOSIS — Z23 Encounter for immunization: Secondary | ICD-10-CM

## 2015-03-11 DIAGNOSIS — M5441 Lumbago with sciatica, right side: Secondary | ICD-10-CM | POA: Diagnosis not present

## 2015-03-11 DIAGNOSIS — R300 Dysuria: Secondary | ICD-10-CM

## 2015-03-11 LAB — POCT URINALYSIS DIPSTICK
Bilirubin, UA: NEGATIVE
Blood, UA: NEGATIVE
Glucose, UA: NEGATIVE
Ketones, UA: NEGATIVE
Leukocytes, UA: NEGATIVE
Nitrite, UA: NEGATIVE
Protein, UA: NEGATIVE
Spec Grav, UA: 1.025
Urobilinogen, UA: NEGATIVE
pH, UA: 5.5

## 2015-03-11 NOTE — Progress Notes (Signed)
Pre visit review using our clinic review tool, if applicable. No additional management support is needed unless otherwise documented below in the visit note. 

## 2015-03-11 NOTE — Telephone Encounter (Signed)
Appt was scheduled.

## 2015-03-11 NOTE — Progress Notes (Signed)
Patient ID: Donna Pham, female   DOB: [birth year], 59 y.o.   MRN: [id number]   Subjective:    Patient ID: Donna Pham, female    DOB: December 16, 1955, 59 y.o.   MRN: [id number]  Chief Complaint  Patient presents with  . Back Pain    c/o LBP and Bilateral Hip Pain x's a few mos  . Hip Pain    HPI Patient is in today with complaint of possible urinary infection.  + abd bloating, dysuria and frequency.    Past Medical History  Diagnosis Date  . Depression   . Diabetes mellitus   . GERD (gastroesophageal reflux disease)   . Hyperlipidemia   . Hypertension   . CTS (carpal tunnel syndrome)     6/15-19/09 RLL PNA with sepsis high LFTs (ALT 61)  . Arthritis   . Right upper quadrant abdominal pain     No past surgical history on file.  Family History  Problem Relation Age of Onset  . Arthritis Father   . Cancer Father   . Diabetes Mother     Social History   Social History  . Marital Status: Married    Spouse Name: N/A  . Number of Children: N/A  . Years of Education: N/A   Occupational History  . Retired    Social History Main Topics  . Smoking status: Never Smoker   . Smokeless tobacco: Never Used  . Alcohol Use: No  . Drug Use: No  . Sexual Activity: Not on file   Other Topics Concern  . Not on file   Social History Narrative   No diet    Regular exercise-no   Originally from [place]    Outpatient Prescriptions Prior to Visit  Medication Sig Dispense Refill  . ACCU-CHEK FASTCLIX LANCETS MISC Check blood sugar twice daily 200 each 3  . albuterol (PROVENTIL HFA;VENTOLIN HFA) 108 (90 BASE) MCG/ACT inhaler Inhale 2 puffs into the lungs every 6 (six) hours as needed for wheezing or shortness of breath. 1 Inhaler 0  . albuterol (PROVENTIL) (2.5 MG/3ML) 0.083% nebulizer solution Take 3 mLs (2.5 mg total) by nebulization 4 (four) times daily as needed for wheezing. 150 mL 1  . Alcohol Swabs (B-D SINGLE USE SWABS REGULAR) PADS Check blood sugar twice daily 200  each 3  . ALPRAZolam (XANAX) 0.25 MG tablet Take 1 tablet (0.25 mg total) by mouth 3 (three) times daily as needed. anxiety 30 tablet 0  . aspirin 81 MG tablet Take 81 mg by mouth daily.      . beclomethasone (QVAR) 40 MCG/ACT inhaler Inhale 2 puffs into the lungs 2 (two) times daily. 1 Inhaler 2  . betamethasone dipropionate (DIPROLENE) 0.05 % cream Apply topically 2 (two) times daily. Use for 10 days at the arms for poison ivy 30 g 0  . Blood Glucose Monitoring Suppl (ACCU-CHEK NANO SMARTVIEW) W/DEVICE KIT Check blood sugar as directed 1 kit 0  . budesonide-formoterol (SYMBICORT) 80-4.5 MCG/ACT inhaler Inhale 2 puffs into the lungs 2 (two) times daily. 1 Inhaler 3  . clonazePAM (KLONOPIN) 0.5 MG tablet Take 1 tablet (0.5 mg total) by mouth 2 (two) times daily. for anxiety 60 tablet 2  . esomeprazole (NEXIUM) 40 MG capsule Take 1 capsule (40 mg total) by mouth daily before breakfast. 90 capsule 3  . fenofibrate 160 MG tablet Take 1 tablet (160 mg total) by mouth daily. 90 tablet 1  . glipiZIDE (GLUCOTROL XL) 10 MG 24 hr tablet Please take 1x a day in  am 90 tablet 3  . glucose blood (ACCU-CHEK SMARTVIEW) test strip Check blood sugar twice a day 200 each 3  . Insulin Degludec (TRESIBA FLEXTOUCH) 200 UNIT/ML SOPN Inject 14 Units into the skin at bedtime. 5 pen 2  . Insulin Pen Needle (CAREFINE PEN NEEDLES) 32G X 4 MM MISC Use 1x a day 100 each 2  . metFORMIN (GLUCOPHAGE XR) 500 MG 24 hr tablet Take 4 tablets (2,000 mg total) by mouth daily with supper. 360 tablet 3  . mirabegron ER (MYRBETRIQ) 50 MG TB24 tablet Take 1 tablet (50 mg total) by mouth daily. 30 tablet 5  . ondansetron (ZOFRAN ODT) 8 MG disintegrating tablet Take 1 tablet (8 mg total) by mouth every 8 (eight) hours as needed for nausea. 20 tablet 0  . ONGLYZA 5 MG TABS tablet TAKE 1 TABLET EVERY DAY 90 tablet 1  . pramoxine-hydrocortisone 1-1 % foam Apply 1 application topically 3 (three) times daily. 10 g 2  . rosuvastatin (CRESTOR) 20  MG tablet Take 1.5 tablets (30 mg total) by mouth daily. 135 tablet 0  . sertraline (ZOLOFT) 100 MG tablet Take 2 tablets (200 mg total) by mouth daily. 180 tablet 1  . terconazole (TERAZOL 7) 0.4 % vaginal cream Use externally for up to 7 days 45 g 0  . amoxicillin (AMOXIL) 875 MG tablet Take 1 tablet (875 mg total) by mouth 2 (two) times daily. 20 tablet 0  . fluconazole (DIFLUCAN) 150 MG tablet Take 1 tablet (150 mg total) by mouth once. And repeat in 3 days if needed 2 tablet 0   No facility-administered medications prior to visit.    No Known Allergies  Review of Systems  Constitutional: Negative for fever, chills and malaise/fatigue.  HENT: Negative for congestion and hearing loss.   Eyes: Negative for discharge.  Respiratory: Negative for cough, sputum production and shortness of breath.   Cardiovascular: Negative for chest pain, palpitations and leg swelling.  Gastrointestinal: Negative for heartburn, nausea, vomiting, abdominal pain, diarrhea, constipation and blood in stool.  Genitourinary: Positive for dysuria and frequency. Negative for urgency and hematuria.  Musculoskeletal: Positive for back pain. Negative for myalgias and falls.  Skin: Negative for rash.  Neurological: Negative for dizziness, sensory change, loss of consciousness, weakness and headaches.  Endo/Heme/Allergies: Negative for environmental allergies. Does not bruise/bleed easily.  Psychiatric/Behavioral: Negative for depression and suicidal ideas. The patient is not nervous/anxious and does not have insomnia.        Objective:    Physical Exam  Constitutional: She is oriented to person, place, and time. She appears well-developed and well-nourished. No distress.  HENT:  Head: Normocephalic and atraumatic.  Eyes: Conjunctivae and EOM are normal.  Neck: Normal range of motion. Neck supple. No JVD present. Carotid bruit is not present. No thyromegaly present.  Cardiovascular: Normal rate, regular rhythm  and normal heart sounds.   No murmur heard. Pulmonary/Chest: Effort normal and breath sounds normal. No respiratory distress. She has no wheezes. She has no rales. She exhibits no tenderness.  Abdominal: Soft. She exhibits no distension. There is no tenderness (no suprapubic or CVA tenderness). There is no rebound and no guarding.  Musculoskeletal: She exhibits no edema.  Neurological: She is alert and oriented to person, place, and time.  Psychiatric: She has a normal mood and affect. Her behavior is normal.  Vitals reviewed.   BP 114/70 mmHg  Pulse 95  Temp(Src) 98.2 F (36.8 C) (Oral)  Wt 223 lb (101.152 kg)  SpO2 97%  Wt Readings from Last 3 Encounters:  03/11/15 223 lb (101.152 kg)  08/12/14 218 lb (98.884 kg)  08/04/14 220 lb 9.6 oz (100.064 kg)     Lab Results  Component Value Date   WBC 10.1 01/08/2015   HGB 14.4 01/08/2015   HCT 42.9 01/08/2015   PLT 245.0 01/08/2015   GLUCOSE 128* 01/08/2015   CHOL 159 01/08/2015   TRIG 147.0 01/08/2015   HDL 48.60 01/08/2015   LDLDIRECT 167.2 08/22/2010   LDLCALC 81 01/08/2015   ALT 14 01/08/2015   AST 16 01/08/2015   NA 138 01/08/2015   K 4.7 01/08/2015   CL 100 01/08/2015   CREATININE 0.78 01/08/2015   BUN 14 01/08/2015   CO2 33* 01/08/2015   TSH 0.63 01/28/2013   HGBA1C 7.4* 01/08/2015   MICROALBUR <0.7 01/08/2015    Lab Results  Component Value Date   TSH 0.63 01/28/2013   Lab Results  Component Value Date   WBC 10.1 01/08/2015   HGB 14.4 01/08/2015   HCT 42.9 01/08/2015   MCV 93.2 01/08/2015   PLT 245.0 01/08/2015   Lab Results  Component Value Date   NA 138 01/08/2015   K 4.7 01/08/2015   CO2 33* 01/08/2015   GLUCOSE 128* 01/08/2015   BUN 14 01/08/2015   CREATININE 0.78 01/08/2015   BILITOT 0.5 01/08/2015   ALKPHOS 82 01/08/2015   AST 16 01/08/2015   ALT 14 01/08/2015   PROT 7.4 01/08/2015   ALBUMIN 4.1 01/08/2015   CALCIUM 9.5 01/08/2015   GFR 79.88 01/08/2015   Lab Results  Component  Value Date   CHOL 159 01/08/2015   Lab Results  Component Value Date   HDL 48.60 01/08/2015   Lab Results  Component Value Date   LDLCALC 81 01/08/2015   Lab Results  Component Value Date   TRIG 147.0 01/08/2015   Lab Results  Component Value Date   CHOLHDL 3 01/08/2015   Lab Results  Component Value Date   HGBA1C 7.4* 01/08/2015       Assessment & Plan:   Problem List Items Addressed This Visit    None      I have discontinued Ms. Cowens amoxicillin and fluconazole. I am also having her maintain her aspirin, pramoxine-hydrocortisone, budesonide-formoterol, albuterol, ondansetron, ALPRAZolam, terconazole, betamethasone dipropionate, esomeprazole, beclomethasone, albuterol, clonazePAM, sertraline, rosuvastatin, metFORMIN, glipiZIDE, fenofibrate, ACCU-CHEK NANO SMARTVIEW, glucose blood, ACCU-CHEK FASTCLIX LANCETS, B-D SINGLE USE SWABS REGULAR, Insulin Degludec, Insulin Pen Needle, mirabegron ER, and ONGLYZA.  No orders of the defined types were placed in this encounter.     Elizabeth Sauer, LPN

## 2015-03-11 NOTE — Telephone Encounter (Signed)
Tell her to come in and we will check urine only--- double book

## 2015-03-11 NOTE — Assessment & Plan Note (Signed)
ua neg Check culture

## 2015-03-11 NOTE — Assessment & Plan Note (Signed)
ua neg Culture pending Drink plenty of fluids

## 2015-03-11 NOTE — Patient Instructions (Signed)
Back Pain, Adult Low back pain is very common. About 1 in 5 people have back pain.The cause of low back pain is rarely dangerous. The pain often gets better over time.About half of people with a sudden onset of back pain feel better in just 2 weeks. About 8 in 10 people feel better by 6 weeks.  CAUSES Some common causes of back pain include:  Strain of the muscles or ligaments supporting the spine.  Wear and tear (degeneration) of the spinal discs.  Arthritis.  Direct injury to the back. DIAGNOSIS Most of the time, the direct cause of low back pain is not known.However, back pain can be treated effectively even when the exact cause of the pain is unknown.Answering your caregiver's questions about your overall health and symptoms is one of the most accurate ways to make sure the cause of your pain is not dangerous. If your caregiver needs more information, he or she may order lab work or imaging tests (X-rays or MRIs).However, even if imaging tests show changes in your back, this usually does not require surgery. HOME CARE INSTRUCTIONS For many people, back pain returns.Since low back pain is rarely dangerous, it is often a condition that people can learn to manageon their own.   Remain active. It is stressful on the back to sit or stand in one place. Do not sit, drive, or stand in one place for more than 30 minutes at a time. Take short walks on level surfaces as soon as pain allows.Try to increase the length of time you walk each day.  Do not stay in bed.Resting more than 1 or 2 days can delay your recovery.  Do not avoid exercise or work.Your body is made to move.It is not dangerous to be active, even though your back may hurt.Your back will likely heal faster if you return to being active before your pain is gone.  Pay attention to your body when you bend and lift. Many people have less discomfortwhen lifting if they bend their knees, keep the load close to their bodies,and  avoid twisting. Often, the most comfortable positions are those that put less stress on your recovering back.  Find a comfortable position to sleep. Use a firm mattress and lie on your side with your knees slightly bent. If you lie on your back, put a pillow under your knees.  Only take over-the-counter or prescription medicines as directed by your caregiver. Over-the-counter medicines to reduce pain and inflammation are often the most helpful.Your caregiver may prescribe muscle relaxant drugs.These medicines help dull your pain so you can more quickly return to your normal activities and healthy exercise.  Put ice on the injured area.  Put ice in a plastic bag.  Place a towel between your skin and the bag.  Leave the ice on for 15-20 minutes, 03-04 times a day for the first 2 to 3 days. After that, ice and heat may be alternated to reduce pain and spasms.  Ask your caregiver about trying back exercises and gentle massage. This may be of some benefit.  Avoid feeling anxious or stressed.Stress increases muscle tension and can worsen back pain.It is important to recognize when you are anxious or stressed and learn ways to manage it.Exercise is a great option. SEEK MEDICAL CARE IF:  You have pain that is not relieved with rest or medicine.  You have pain that does not improve in 1 week.  You have new symptoms.  You are generally not feeling well. SEEK   IMMEDIATE MEDICAL CARE IF:   You have pain that radiates from your back into your legs.  You develop new bowel or bladder control problems.  You have unusual weakness or numbness in your arms or legs.  You develop nausea or vomiting.  You develop abdominal pain.  You feel faint. Document Released: 07/03/2005 Document Revised: 01/02/2012 Document Reviewed: 11/04/2013 Exit[employer] Patient Information 2015 [employer]. This information is not intended to replace advice given to you by your health care provider. Make sure you  discuss any questions you have with your health care provider.  

## 2015-03-12 LAB — URINE CULTURE: Colony Count: 40000

## 2015-03-19 NOTE — Patient Outreach (Signed)
[facility]) Care Management  03/19/2015  Donna Pham December 19, 1955 [id number]   [facility] phoned [place] Management with referral, assigned Deloria Lair, NP, Deanne Coffer, PharmD and [facility], LCSW to outreach.  Thanks, Ronnell Freshwater. [place], [place] Assistant Phone: [phone] Fax: [phone]

## 2015-06-07 ENCOUNTER — Other Ambulatory Visit: Payer: Self-pay | Admitting: Family Medicine

## 2015-06-12 ENCOUNTER — Other Ambulatory Visit: Payer: Self-pay | Admitting: Internal Medicine

## 2015-06-14 MED ORDER — INSULIN PEN NEEDLE 32G X 4 MM MISC: Status: DC

## 2015-06-14 NOTE — Telephone Encounter (Signed)
Patient Name: [facility] Gender: Female DOB: 1956/07/13 Age: 59 Y 84 M 22 D Return Phone Number: [phone] (Primary) Address: [address] [place] City/State/Zip: [place] [address] Client [facility] Night - Client Client Site [facility] Physician Philemon Kingdom Contact Type Call Call Type Triage / Clinical Relationship To Patient Provider Return Phone Number [phone] (Primary) Chief Complaint Prescription Refill or Medication Request (non symptomatic) Initial Comment Caller states Shu w/[facility], [phone]; BD pen needle for insulin; out of pen needle; has been faxing for refills; needs needles for shots for tonight; Nurse Assessment Nurse: Harlow Mares, RN, Suanne Marker Date/Time Eilene Ghazi Time): 06/12/2015 3:50:40 PM

## 2015-06-14 NOTE — Addendum Note (Signed)
Addended by: Philemon Kingdom on: 06/14/2015 05:21 PM   Modules accepted: Orders

## 2015-07-07 ENCOUNTER — Other Ambulatory Visit: Payer: Self-pay | Admitting: *Deleted

## 2015-07-07 NOTE — Patient Outreach (Signed)
I just was notified we received a referral for both [name] back in September. I have been involved with Donna Pham since then but was not aware of his wife's referral and certainly did not feel she had any needs. I discussed this with Donna Pham today while I was visiting with Donna Pham husband. She said she would like me to help coach Donna Pham on Donna Pham diabetes managemant. I gave Donna Pham a diabetes education folder and advised Donna Pham to review and when I come back on January 17 to see Donna Pham, I will begin Donna Pham teaching. She was agreeable to this arrangement.  Donna Pham [facility] [phone]

## 2015-07-13 ENCOUNTER — Other Ambulatory Visit: Payer: Self-pay | Admitting: *Deleted

## 2015-07-14 NOTE — Patient Outreach (Signed)
[facility]) Care Management  07/14/2015  Donna Pham 07/15/1956 [id number]    I was notified by care management assistant that a referral was received for this patient and her spouse in September.  However this Education officer, museum only received the referral for patient's spouse and have since provided the resources needed and have closed his case.  This Education officer, museum contacted patient to assess for any social work needs.  Per patient, she has all of her medications and knows to call the pharmacy or the doctor with questions.  Patient denies having transporation needs, per patient  they are using their [employer] transportation benefit to get to their doctor's appointments. Their neighbor has also been assisting with transportation to appointments when she can as well   Patient denies feelings of depression, however did discussed feeling overwhelmed with the care taking responsibilities for her spouse.  Per patient, she wished she could get more in home care for patient.  Her spouse is currently on the waiting list for the [facility] for in home assistance on a sliding scale.  Patient verbalized having no social work needs and was mainly focused on her spouse receiving the care that he needs.  Patient has declined need for services at this time.   Sheralyn Boatman [facility] Care Management [phone]

## 2015-07-16 ENCOUNTER — Other Ambulatory Visit: Payer: Self-pay | Admitting: Internal Medicine

## 2015-07-20 ENCOUNTER — Other Ambulatory Visit: Payer: Self-pay | Admitting: Pharmacist

## 2015-07-20 NOTE — Patient Outreach (Signed)
[facility]) Care Management  07/20/2015  Donna Pham 1955/08/17 [id number]   Donna Pham is a 60yo who was referred to [facility] for medication review and medication assistance.  I was recently notified by care management assistant that a referral was received for this patient in September.  I made outreach call to patient to review her medications and assess for pharmacy needs.  Patient reports she just got home from the store and was very busy right now.  Patient denies difficulty affording her medications.  Patient requested a phone call on Monday, 07/25/14 to review her medications.  I will plan outreach call on Monday, 07/25/14.     Donna Pham, Pharm.D. Pharmacy Resident [place] [phone]

## 2015-07-21 ENCOUNTER — Other Ambulatory Visit: Payer: Self-pay | Admitting: Family Medicine

## 2015-07-21 NOTE — Telephone Encounter (Signed)
Rx filled to pharmacy.

## 2015-07-23 ENCOUNTER — Encounter: Payer: Self-pay | Admitting: Family Medicine

## 2015-07-23 ENCOUNTER — Ambulatory Visit (HOSPITAL_BASED_OUTPATIENT_CLINIC_OR_DEPARTMENT_OTHER)
Admission: RE | Admit: 2015-07-23 | Discharge: 2015-07-23 | Disposition: A | Discharge status: Home / Self Care | Payer: Commercial Managed Care - HMO | Source: Ambulatory Visit | Attending: Family Medicine | Admitting: Family Medicine

## 2015-07-23 ENCOUNTER — Other Ambulatory Visit (HOSPITAL_COMMUNITY)
Admission: RE | Admit: 2015-07-23 | Discharge: 2015-07-23 | Disposition: A | Discharge status: Home / Self Care | Payer: Commercial Managed Care - HMO | Source: Ambulatory Visit | Attending: Family Medicine | Admitting: Family Medicine

## 2015-07-23 ENCOUNTER — Ambulatory Visit (INDEPENDENT_AMBULATORY_CARE_PROVIDER_SITE_OTHER): Payer: Commercial Managed Care - HMO | Admitting: Family Medicine

## 2015-07-23 VITALS — BP 126/86 | HR 74 | Temp 97.6°F | Ht 64.0 in | Wt 224.6 lb

## 2015-07-23 DIAGNOSIS — Z0000 Encounter for general adult medical examination without abnormal findings: Secondary | ICD-10-CM | POA: Diagnosis not present

## 2015-07-23 DIAGNOSIS — Z124 Encounter for screening for malignant neoplasm of cervix: Secondary | ICD-10-CM

## 2015-07-23 DIAGNOSIS — Z1151 Encounter for screening for human papillomavirus (HPV): Secondary | ICD-10-CM | POA: Diagnosis present

## 2015-07-23 DIAGNOSIS — I10 Essential (primary) hypertension: Secondary | ICD-10-CM | POA: Diagnosis not present

## 2015-07-23 DIAGNOSIS — Z01419 Encounter for gynecological examination (general) (routine) without abnormal findings: Secondary | ICD-10-CM | POA: Insufficient documentation

## 2015-07-23 DIAGNOSIS — Z1231 Encounter for screening mammogram for malignant neoplasm of breast: Secondary | ICD-10-CM | POA: Diagnosis present

## 2015-07-23 DIAGNOSIS — IMO0002 Reserved for concepts with insufficient information to code with codable children: Secondary | ICD-10-CM

## 2015-07-23 DIAGNOSIS — E785 Hyperlipidemia, unspecified: Secondary | ICD-10-CM | POA: Diagnosis not present

## 2015-07-23 DIAGNOSIS — E1165 Type 2 diabetes mellitus with hyperglycemia: Secondary | ICD-10-CM

## 2015-07-23 DIAGNOSIS — E1151 Type 2 diabetes mellitus with diabetic peripheral angiopathy without gangrene: Secondary | ICD-10-CM

## 2015-07-23 LAB — LIPID PANEL
Cholesterol: 188 mg/dL (ref 0–200)
HDL: 49.5 mg/dL (ref >39.00)
LDL Cholesterol: 113 mg/dL — ABNORMAL HIGH (ref 0–99)
NonHDL: 138.2
Total CHOL/HDL Ratio: 4
Triglycerides: 126 mg/dL (ref 0.0–149.0)
VLDL: 25.2 mg/dL (ref 0.0–40.0)

## 2015-07-23 LAB — COMPREHENSIVE METABOLIC PANEL
ALT: 14 U/L (ref 0–35)
AST: 16 U/L (ref 0–37)
Albumin: 4.1 g/dL (ref 3.5–5.2)
Alkaline Phosphatase: 88 U/L (ref 39–117)
BUN: 16 mg/dL (ref 6–23)
CO2: 30 mEq/L (ref 19–32)
Calcium: 9.6 mg/dL (ref 8.4–10.5)
Chloride: 102 mEq/L (ref 96–112)
Creatinine, Ser: 0.82 mg/dL (ref 0.40–1.20)
GFR: 75.26 mL/min (ref >60.00)
Glucose, Bld: 148 mg/dL — ABNORMAL HIGH (ref 70–99)
Potassium: 5 mEq/L (ref 3.5–5.1)
Sodium: 138 mEq/L (ref 135–145)
Total Bilirubin: 0.5 mg/dL (ref 0.2–1.2)
Total Protein: 7.4 g/dL (ref 6.0–8.3)

## 2015-07-23 LAB — CBC WITH DIFFERENTIAL/PLATELET
Basophils Absolute: 0 10*3/uL (ref 0.0–0.1)
Basophils Relative: 0.2 % (ref 0.0–3.0)
Eosinophils Absolute: 0 10*3/uL (ref 0.0–0.7)
Eosinophils Relative: 0.1 % (ref 0.0–5.0)
HCT: 43.9 % (ref 36.0–46.0)
Hemoglobin: 14.5 g/dL (ref 12.0–15.0)
Lymphocytes Relative: 26 % (ref 12.0–46.0)
Lymphs Abs: 2.9 10*3/uL (ref 0.7–4.0)
MCHC: 33.1 g/dL (ref 30.0–36.0)
MCV: 93.2 fl (ref 78.0–100.0)
Monocytes Absolute: 0.9 10*3/uL (ref 0.1–1.0)
Monocytes Relative: 8.1 % (ref 3.0–12.0)
Neutro Abs: 7.4 10*3/uL (ref 1.4–7.7)
Neutrophils Relative %: 65.6 % (ref 43.0–77.0)
Platelets: 262 10*3/uL (ref 150.0–400.0)
RBC: 4.72 Mil/uL (ref 3.87–5.11)
RDW: 13.2 % (ref 11.5–15.5)
WBC: 11.2 10*3/uL — ABNORMAL HIGH (ref 4.0–10.5)

## 2015-07-23 LAB — POCT URINALYSIS DIPSTICK
Bilirubin, UA: NEGATIVE
Blood, UA: NEGATIVE
Glucose, UA: NEGATIVE
Ketones, UA: NEGATIVE
Leukocytes, UA: NEGATIVE
Nitrite, UA: NEGATIVE
Protein, UA: NEGATIVE
Spec Grav, UA: >=1.03
Urobilinogen, UA: 0.2
pH, UA: 5.5

## 2015-07-23 LAB — MICROALBUMIN / CREATININE URINE RATIO
Creatinine,U: 227.2 mg/dL
Microalb Creat Ratio: 0.4 mg/g (ref 0.0–30.0)
Microalb, Ur: 0.9 mg/dL (ref 0.0–1.9)

## 2015-07-23 LAB — HEMOGLOBIN A1C: Hgb A1c MFr Bld: 7.6 % — ABNORMAL HIGH (ref 4.6–6.5)

## 2015-07-23 NOTE — Progress Notes (Signed)
Subjective:   Donna Pham is a 60 y.o. female who presents for Medicare Annual (Subsequent) preventive examination.  Review of Systems:   Review of Systems  Constitutional: Negative for activity change, appetite change and fatigue.  HENT: Negative for hearing loss, congestion, tinnitus and ear discharge.   Eyes: Negative for visual disturbance (see optho q1y -- vision corrected to 20/20 with glasses).  Respiratory: Negative for cough, chest tightness and shortness of breath.   Cardiovascular: Negative for chest pain, palpitations and leg swelling.  Gastrointestinal: Negative for abdominal pain, diarrhea, constipation and abdominal distention.  Genitourinary: Negative for urgency, frequency, decreased urine volume and difficulty urinating.  Musculoskeletal: Negative for back pain, arthralgias and gait problem.  Skin: Negative for color change, pallor and rash.  Neurological: Negative for dizziness, light-headedness, numbness and headaches.  Hematological: Negative for adenopathy. Does not bruise/bleed easily.  Psychiatric/Behavioral: Negative for suicidal ideas, confusion, sleep disturbance, self-injury, dysphoric mood, decreased concentration and agitation.  Pt is able to read and write and can do all ADLs No risk for falling No abuse/ violence in home           Objective:     Vitals: BP 126/86 mmHg  Pulse 74  Temp(Src) 97.6 F (36.4 C) (Oral)  Ht 5' 4"  (1.626 m)  Wt 224 lb 9.6 oz (101.878 kg)  BMI 38.53 kg/m2  SpO2 96% BP 126/86 mmHg  Pulse 74  Temp(Src) 97.6 F (36.4 C) (Oral)  Ht 5' 4"  (1.626 m)  Wt 224 lb 9.6 oz (101.878 kg)  BMI 38.53 kg/m2  SpO2 96% General appearance: alert, cooperative, appears stated age and no distress Head: Normocephalic, without obvious abnormality, atraumatic Eyes: negative findings: lids and lashes normal and pupils equal, round, reactive to light and accomodation Ears: normal TM's and external ear canals both ears Nose: Nares  normal. Septum midline. Mucosa normal. No drainage or sinus tenderness. Throat: lips, mucosa, and tongue normal; teeth and gums normal Neck: no adenopathy, no carotid bruit, no JVD, supple, symmetrical, trachea midline and thyroid not enlarged, symmetric, no tenderness/mass/nodules Back: symmetric, no curvature. ROM normal. No CVA tenderness. Lungs: clear to auscultation bilaterally Breasts: normal appearance, no masses or tenderness Heart: S1, S2 normal Abdomen: soft, non-tender; bowel sounds normal; no masses,  no organomegaly Pelvic: not indicated; post-menopausal, no abnormal Pap smears in past Extremities: extremities normal, atraumatic, no cyanosis or edema Pulses: 2+ and symmetric Skin: Skin color, texture, turgor normal. No rashes or lesions Lymph nodes: Cervical, supraclavicular, and axillary nodes normal. Neurologic: Alert and oriented X 3, normal strength and tone. Normal symmetric reflexes. Normal coordination and gait Psych--  Anxiety stable-- still some stress Tobacco History  Smoking status  . Never Smoker   Smokeless tobacco  . Never Used     Counseling given: Not Answered   Past Medical History  Diagnosis Date  . Depression   . Diabetes mellitus   . GERD (gastroesophageal reflux disease)   . Hyperlipidemia   . Hypertension   . CTS (carpal tunnel syndrome)     6/15-19/09 RLL PNA with sepsis high LFTs (ALT 61)  . Arthritis   . Right upper quadrant abdominal pain    No past surgical history on file. Family History  Problem Relation Age of Onset  . Arthritis Father   . Cancer Father   . Diabetes Mother    History  Sexual Activity  . Sexual Activity: Not on file    Outpatient Encounter Prescriptions as of 07/23/2015  Medication Sig  .  ACCU-CHEK FASTCLIX LANCETS MISC Check blood sugar twice daily  . ACCU-CHEK SMARTVIEW test strip CHECK BLOOD SUGAR TWICE DAILY  . albuterol (PROVENTIL HFA;VENTOLIN HFA) 108 (90 BASE) MCG/ACT inhaler Inhale 2 puffs into the  lungs every 6 (six) hours as needed for wheezing or shortness of breath.  Marland Kitchen albuterol (PROVENTIL) (2.5 MG/3ML) 0.083% nebulizer solution Take 3 mLs (2.5 mg total) by nebulization 4 (four) times daily as needed for wheezing.  . Alcohol Swabs (B-D SINGLE USE SWABS REGULAR) PADS Check blood sugar twice daily  . ALPRAZolam (XANAX) 0.25 MG tablet Take 1 tablet (0.25 mg total) by mouth 3 (three) times daily as needed. anxiety  . aspirin 81 MG tablet Take 81 mg by mouth daily.    . beclomethasone (QVAR) 40 MCG/ACT inhaler Inhale 2 puffs into the lungs 2 (two) times daily.  . betamethasone dipropionate (DIPROLENE) 0.05 % cream Apply topically 2 (two) times daily. Use for 10 days at the arms for poison ivy  . Blood Glucose Monitoring Suppl (ACCU-CHEK NANO SMARTVIEW) W/DEVICE KIT Check blood sugar as directed  . budesonide-formoterol (SYMBICORT) 80-4.5 MCG/ACT inhaler Inhale 2 puffs into the lungs 2 (two) times daily.  . clonazePAM (KLONOPIN) 0.5 MG tablet Take 1 tablet (0.5 mg total) by mouth 2 (two) times daily. for anxiety  . esomeprazole (NEXIUM) 40 MG capsule Take 1 capsule (40 mg total) by mouth daily before breakfast.  . fenofibrate 160 MG tablet Take 1 tablet (160 mg total) by mouth daily.  Marland Kitchen glipiZIDE (GLUCOTROL XL) 10 MG 24 hr tablet Please take 1x a day in am  . Insulin Pen Needle (BD PEN NEEDLE NANO U/F) 32G X 4 MM MISC USE AS DIRECTED ONCE DAILY  . metFORMIN (GLUCOPHAGE XR) 500 MG 24 hr tablet Take 4 tablets (2,000 mg total) by mouth daily with supper.  . mirabegron ER (MYRBETRIQ) 50 MG TB24 tablet Take 1 tablet (50 mg total) by mouth daily.  . ondansetron (ZOFRAN ODT) 8 MG disintegrating tablet Take 1 tablet (8 mg total) by mouth every 8 (eight) hours as needed for nausea.  . pramoxine-hydrocortisone 1-1 % foam Apply 1 application topically 3 (three) times daily.  . rosuvastatin (CRESTOR) 20 MG tablet Take 1.5 tablets (30 mg total) by mouth daily.  . saxagliptin HCl (ONGLYZA) 5 MG TABS tablet  Take 1 tablet (5 mg total) by mouth daily. Repeat labs are due now  . sertraline (ZOLOFT) 100 MG tablet Take 2 tablets (200 mg total) by mouth daily.  Marland Kitchen terconazole (TERAZOL 7) 0.4 % vaginal cream Use externally for up to 7 days  . TRESIBA FLEXTOUCH 200 UNIT/ML SOPN INJECT 14 UNITS INTO THE SKIN AT BEDTIME   No facility-administered encounter medications on file as of 07/23/2015.    Activities of Daily Living In your present state of health, do you have any difficulty performing the following activities: 03/11/2015  Hearing? N  Vision? N  Difficulty concentrating or making decisions? N  Walking or climbing stairs? N  Dressing or bathing? N  Doing errands, shopping? N    Patient Care Team: Rosalita Chessman, DO as PCP - General Deloria Lair, NP as Registered Nurse    Assessment:    cPe Exercise Activities and Dietary recommendations-- pt is not currently exercising--- understands recommendations to exercise    Goals    None     Fall Risk No flowsheet data found. Depression Screen No flowsheet data found.   Cognitive Testing No flowsheet data found.  Immunization History  Administered Date(s) Administered  .  Influenza,inj,Quad PF,36+ Mos 04/28/2013, 05/06/2014, 03/20/2015  . Pneumococcal Polysaccharide-23 03/11/2015  . Td 07/13/2008   Screening Tests Health Maintenance  Topic Date Due  . Hepatitis C Screening  03-29-1956  . HIV Screening  04/19/1969  . COLONOSCOPY  04/19/2004  . FOOT EXAM  06/02/2010  . ZOSTAVAX  04/19/2014  . MAMMOGRAM  05/10/2015  . HEMOGLOBIN A1C  07/10/2015  . PAP SMEAR  07/22/2016 (Originally 04/20/1975)  . OPHTHALMOLOGY EXAM  12/21/2015  . URINE MICROALBUMIN  01/08/2016  . INFLUENZA VACCINE  02/15/2016  . TETANUS/TDAP  07/13/2018  . PNEUMOCOCCAL POLYSACCHARIDE VACCINE (2) 03/10/2020      Plan:    cpe During the course of the visit the patient was educated and counseled about the following appropriate screening and preventive services:     Vaccines to include Pneumoccal, Influenza, Hepatitis B, Td, Zostavax, HCV  Electrocardiogram  Cardiovascular Disease  Colorectal cancer screening  Bone density screening  Diabetes screening  Glaucoma screening  Mammography/PAP  Nutrition counseling   Patient Instructions (the written plan) was given to the patient.  1. Visit for screening mammogram  - MM Digital Screening; Future  2. Essential hypertension stable - Comp Met (CMET) - CBC with Differential/Platelet - Hemoglobin A1c - Lipid panel - Microalbumin / creatinine urine ratio - POCT urinalysis dipstick  3. Hyperlipidemia con't crestor Check labs  - Comp Met (CMET) - CBC with Differential/Platelet - Hemoglobin A1c - Lipid panel - Microalbumin / creatinine urine ratio - POCT urinalysis dipstick  4. Preventative health care    5. DM (diabetes mellitus) type II uncontrolled, periph vascular disorder ([place]) Per endo - Ambulatory referral to Endocrinology  6. Routine history and physical examination of adult    7. Screening for malignant neoplasm of cervix   - Cytology - PAP  Garnet Koyanagi, DO  07/23/2015

## 2015-07-23 NOTE — Patient Instructions (Signed)
Preventive Care for Adults, Female A healthy lifestyle and preventive care can promote health and wellness. Preventive health guidelines for women include the following key practices.  A routine yearly physical is a good way to check with your health care provider about your health and preventive screening. It is a chance to share any concerns and updates on your health and to receive a thorough exam.  Visit your dentist for a routine exam and preventive care every 6 months. Brush your teeth twice a day and floss once a day. Good oral hygiene prevents tooth decay and gum disease.  The frequency of eye exams is based on your age, health, family medical history, use of contact lenses, and other factors. Follow your health care provider's recommendations for frequency of eye exams.  Eat a healthy diet. Foods like vegetables, fruits, whole grains, low-fat dairy products, and lean protein foods contain the nutrients you need without too many calories. Decrease your intake of foods high in solid fats, added sugars, and salt. Eat the right amount of calories for you.Get information about a proper diet from your health care provider, if necessary.  Regular physical exercise is one of the most important things you can do for your health. Most adults should get at least 150 minutes of moderate-intensity exercise (any activity that increases your heart rate and causes you to sweat) each week. In addition, most adults need muscle-strengthening exercises on 2 or more days a week.  Maintain a healthy weight. The body mass index (BMI) is a screening tool to identify possible weight problems. It provides an estimate of body fat based on height and weight. Your health care provider can find your BMI and can help you achieve or maintain a healthy weight.For adults 20 years and older:  A BMI below 18.5 is considered underweight.  A BMI of 18.5 to 24.9 is normal.  A BMI of 25 to 29.9 is considered overweight.  A  BMI of 30 and above is considered obese.  Maintain normal blood lipids and cholesterol levels by exercising and minimizing your intake of saturated fat. Eat a balanced diet with plenty of fruit and vegetables. Blood tests for lipids and cholesterol should begin at age 45 and be repeated every 5 years. If your lipid or cholesterol levels are high, you are over 50, or you are at high risk for heart disease, you may need your cholesterol levels checked more frequently.Ongoing high lipid and cholesterol levels should be treated with medicines if diet and exercise are not working.  If you smoke, find out from your health care provider how to quit. If you do not use tobacco, do not start.  Lung cancer screening is recommended for adults aged 45-80 years who are at high risk for developing lung cancer because of a history of smoking. A yearly low-dose CT scan of the lungs is recommended for people who have at least a 30-pack-year history of smoking and are a current smoker or have quit within the past 15 years. A pack year of smoking is smoking an average of 1 pack of cigarettes a day for 1 year (for example: 1 pack a day for 30 years or 2 packs a day for 15 years). Yearly screening should continue until the smoker has stopped smoking for at least 15 years. Yearly screening should be stopped for people who develop a health problem that would prevent them from having lung cancer treatment.  If you are pregnant, do not drink alcohol. If you are  breastfeeding, be very cautious about drinking alcohol. If you are not pregnant and choose to drink alcohol, do not have more than 1 drink per day. One drink is considered to be 12 ounces (355 mL) of beer, 5 ounces (148 mL) of wine, or 1.5 ounces (44 mL) of liquor.  Avoid use of street drugs. Do not share needles with anyone. Ask for help if you need support or instructions about stopping the use of drugs.  High blood pressure causes heart disease and increases the risk  of stroke. Your blood pressure should be checked at least every 1 to 2 years. Ongoing high blood pressure should be treated with medicines if weight loss and exercise do not work.  If you are 55-79 years old, ask your health care provider if you should take aspirin to prevent strokes.  Diabetes screening is done by taking a blood sample to check your blood glucose level after you have not eaten for a certain period of time (fasting). If you are not overweight and you do not have risk factors for diabetes, you should be screened once every 3 years starting at age 45. If you are overweight or obese and you are 40-70 years of age, you should be screened for diabetes every year as part of your cardiovascular risk assessment.  Breast cancer screening is essential preventive care for women. You should practice "breast self-awareness." This means understanding the normal appearance and feel of your breasts and may include breast self-examination. Any changes detected, no matter how small, should be reported to a health care provider. Women in their 20s and 30s should have a clinical breast exam (CBE) by a health care provider as part of a regular health exam every 1 to 3 years. After age 40, women should have a CBE every year. Starting at age 40, women should consider having a mammogram (breast X-ray test) every year. Women who have a family history of breast cancer should talk to their health care provider about genetic screening. Women at a high risk of breast cancer should talk to their health care providers about having an MRI and a mammogram every year.  Breast cancer gene (BRCA)-related cancer risk assessment is recommended for women who have family members with BRCA-related cancers. BRCA-related cancers include breast, ovarian, tubal, and peritoneal cancers. Having family members with these cancers may be associated with an increased risk for harmful changes (mutations) in the breast cancer genes BRCA1 and  BRCA2. Results of the assessment will determine the need for genetic counseling and BRCA1 and BRCA2 testing.  Your health care provider may recommend that you be screened regularly for cancer of the pelvic organs (ovaries, uterus, and vagina). This screening involves a pelvic examination, including checking for microscopic changes to the surface of your cervix (Pap test). You may be encouraged to have this screening done every 3 years, beginning at age 21.  For women ages 30-65, health care providers may recommend pelvic exams and Pap testing every 3 years, or they may recommend the Pap and pelvic exam, combined with testing for human papilloma virus (HPV), every 5 years. Some types of HPV increase your risk of cervical cancer. Testing for HPV may also be done on women of any age with unclear Pap test results.  Other health care providers may not recommend any screening for nonpregnant women who are considered low risk for pelvic cancer and who do not have symptoms. Ask your health care provider if a screening pelvic exam is right for   you.  If you have had past treatment for cervical cancer or a condition that could lead to cancer, you need Pap tests and screening for cancer for at least 20 years after your treatment. If Pap tests have been discontinued, your risk factors (such as having a new sexual partner) need to be reassessed to determine if screening should resume. Some women have medical problems that increase the chance of getting cervical cancer. In these cases, your health care provider may recommend more frequent screening and Pap tests.  Colorectal cancer can be detected and often prevented. Most routine colorectal cancer screening begins at the age of 50 years and continues through age 75 years. However, your health care provider may recommend screening at an earlier age if you have risk factors for colon cancer. On a yearly basis, your health care provider may provide home test kits to check  for hidden blood in the stool. Use of a small camera at the end of a tube, to directly examine the colon (sigmoidoscopy or colonoscopy), can detect the earliest forms of colorectal cancer. Talk to your health care provider about this at age 50, when routine screening begins. Direct exam of the colon should be repeated every 5-10 years through age 75 years, unless early forms of precancerous polyps or small growths are found.  People who are at an increased risk for hepatitis B should be screened for this virus. You are considered at high risk for hepatitis B if:  You were born in a country where hepatitis B occurs often. Talk with your health care provider about which countries are considered high risk.  Your parents were born in a high-risk country and you have not received a shot to protect against hepatitis B (hepatitis B vaccine).  You have HIV or AIDS.  You use needles to inject street drugs.  You live with, or have sex with, someone who has hepatitis B.  You get hemodialysis treatment.  You take certain medicines for conditions like cancer, organ transplantation, and autoimmune conditions.  Hepatitis C blood testing is recommended for all people born from 1945 through 1965 and any individual with known risks for hepatitis C.  Practice safe sex. Use condoms and avoid high-risk sexual practices to reduce the spread of sexually transmitted infections (STIs). STIs include gonorrhea, chlamydia, syphilis, trichomonas, herpes, HPV, and human immunodeficiency virus (HIV). Herpes, HIV, and HPV are viral illnesses that have no cure. They can result in disability, cancer, and death.  You should be screened for sexually transmitted illnesses (STIs) including gonorrhea and chlamydia if:  You are sexually active and are younger than 24 years.  You are older than 24 years and your health care provider tells you that you are at risk for this type of infection.  Your sexual activity has changed  since you were last screened and you are at an increased risk for chlamydia or gonorrhea. Ask your health care provider if you are at risk.  If you are at risk of being infected with HIV, it is recommended that you take a prescription medicine daily to prevent HIV infection. This is called preexposure prophylaxis (PrEP). You are considered at risk if:  You are sexually active and do not regularly use condoms or know the HIV status of your partner(s).  You take drugs by injection.  You are sexually active with a partner who has HIV.  Talk with your health care provider about whether you are at high risk of being infected with HIV. If   you choose to begin PrEP, you should first be tested for HIV. You should then be tested every 3 months for as long as you are taking PrEP.  Osteoporosis is a disease in which the bones lose minerals and strength with aging. This can result in serious bone fractures or breaks. The risk of osteoporosis can be identified using a bone density scan. Women ages 67 years and over and women at risk for fractures or osteoporosis should discuss screening with their health care providers. Ask your health care provider whether you should take a calcium supplement or vitamin D to reduce the rate of osteoporosis.  Menopause can be associated with physical symptoms and risks. Hormone replacement therapy is available to decrease symptoms and risks. You should talk to your health care provider about whether hormone replacement therapy is right for you.  Use sunscreen. Apply sunscreen liberally and repeatedly throughout the day. You should seek shade when your shadow is shorter than you. Protect yourself by wearing long sleeves, pants, a wide-brimmed hat, and sunglasses year round, whenever you are outdoors.  Once a month, do a whole body skin exam, using a mirror to look at the skin on your back. Tell your health care provider of new moles, moles that have irregular borders, moles that  are larger than a pencil eraser, or moles that have changed in shape or color.  Stay current with required vaccines (immunizations).  Influenza vaccine. All adults should be immunized every year.  Tetanus, diphtheria, and acellular pertussis (Td, Tdap) vaccine. Pregnant women should receive 1 dose of Tdap vaccine during each pregnancy. The dose should be obtained regardless of the length of time since the last dose. Immunization is preferred during the 27th-36th week of gestation. An adult who has not previously received Tdap or who does not know her vaccine status should receive 1 dose of Tdap. This initial dose should be followed by tetanus and diphtheria toxoids (Td) booster doses every 10 years. Adults with an unknown or incomplete history of completing a 3-dose immunization series with Td-containing vaccines should begin or complete a primary immunization series including a Tdap dose. Adults should receive a Td booster every 10 years.  Varicella vaccine. An adult without evidence of immunity to varicella should receive 2 doses or a second dose if she has previously received 1 dose. Pregnant females who do not have evidence of immunity should receive the first dose after pregnancy. This first dose should be obtained before leaving the health care facility. The second dose should be obtained 4-8 weeks after the first dose.  Human papillomavirus (HPV) vaccine. Females aged 13-26 years who have not received the vaccine previously should obtain the 3-dose series. The vaccine is not recommended for use in pregnant females. However, pregnancy testing is not needed before receiving a dose. If a female is found to be pregnant after receiving a dose, no treatment is needed. In that case, the remaining doses should be delayed until after the pregnancy. Immunization is recommended for any person with an immunocompromised condition through the age of 61 years if she did not get any or all doses earlier. During the  3-dose series, the second dose should be obtained 4-8 weeks after the first dose. The third dose should be obtained 24 weeks after the first dose and 16 weeks after the second dose.  Zoster vaccine. One dose is recommended for adults aged 30 years or older unless certain conditions are present.  Measles, mumps, and rubella (MMR) vaccine. Adults born  before 1957 generally are considered immune to measles and mumps. Adults born in 1957 or later should have 1 or more doses of MMR vaccine unless there is a contraindication to the vaccine or there is laboratory evidence of immunity to each of the three diseases. A routine second dose of MMR vaccine should be obtained at least 28 days after the first dose for students attending postsecondary schools, health care workers, or international travelers. People who received inactivated measles vaccine or an unknown type of measles vaccine during 1963-1967 should receive 2 doses of MMR vaccine. People who received inactivated mumps vaccine or an unknown type of mumps vaccine before 1979 and are at high risk for mumps infection should consider immunization with 2 doses of MMR vaccine. For females of childbearing age, rubella immunity should be determined. If there is no evidence of immunity, females who are not pregnant should be vaccinated. If there is no evidence of immunity, females who are pregnant should delay immunization until after pregnancy. Unvaccinated health care workers born before 1957 who lack laboratory evidence of measles, mumps, or rubella immunity or laboratory confirmation of disease should consider measles and mumps immunization with 2 doses of MMR vaccine or rubella immunization with 1 dose of MMR vaccine.  Pneumococcal 13-valent conjugate (PCV13) vaccine. When indicated, a person who is uncertain of his immunization history and has no record of immunization should receive the PCV13 vaccine. All adults 65 years of age and older should receive this  vaccine. An adult aged 19 years or older who has certain medical conditions and has not been previously immunized should receive 1 dose of PCV13 vaccine. This PCV13 should be followed with a dose of pneumococcal polysaccharide (PPSV23) vaccine. Adults who are at high risk for pneumococcal disease should obtain the PPSV23 vaccine at least 8 weeks after the dose of PCV13 vaccine. Adults older than 62 years of age who have normal immune system function should obtain the PPSV23 vaccine dose at least 1 year after the dose of PCV13 vaccine.  Pneumococcal polysaccharide (PPSV23) vaccine. When PCV13 is also indicated, PCV13 should be obtained first. All adults aged 65 years and older should be immunized. An adult younger than age 65 years who has certain medical conditions should be immunized. Any person who resides in a nursing home or long-term care facility should be immunized. An adult smoker should be immunized. People with an immunocompromised condition and certain other conditions should receive both PCV13 and PPSV23 vaccines. People with human immunodeficiency virus (HIV) infection should be immunized as soon as possible after diagnosis. Immunization during chemotherapy or radiation therapy should be avoided. Routine use of PPSV23 vaccine is not recommended for American Indians, [place] Natives, or people younger than 65 years unless there are medical conditions that require PPSV23 vaccine. When indicated, people who have unknown immunization and have no record of immunization should receive PPSV23 vaccine. One-time revaccination 5 years after the first dose of PPSV23 is recommended for people aged 19-64 years who have chronic kidney failure, nephrotic syndrome, asplenia, or immunocompromised conditions. People who received 1-2 doses of PPSV23 before age 65 years should receive another dose of PPSV23 vaccine at age 65 years or later if at least 5 years have passed since the previous dose. Doses of PPSV23 are not  needed for people immunized with PPSV23 at or after age 65 years.  Meningococcal vaccine. Adults with asplenia or persistent complement component deficiencies should receive 2 doses of quadrivalent meningococcal conjugate (MenACWY-D) vaccine. The doses should be obtained   at least 2 months apart. Microbiologists working with certain meningococcal bacteria, [place] recruits, people at risk during an outbreak, and people who travel to or live in countries with a high rate of meningitis should be immunized. A first-year college student up through age 34 years who is living in a residence hall should receive a dose if she did not receive a dose on or after her 16th birthday. Adults who have certain high-risk conditions should receive one or more doses of vaccine.  Hepatitis A vaccine. Adults who wish to be protected from this disease, have certain high-risk conditions, work with hepatitis A-infected animals, work in hepatitis A research labs, or travel to or work in countries with a high rate of hepatitis A should be immunized. Adults who were previously unvaccinated and who anticipate close contact with an international adoptee during the first 60 days after arrival in the [place] from a country with a high rate of hepatitis A should be immunized.  Hepatitis B vaccine. Adults who wish to be protected from this disease, have certain high-risk conditions, may be exposed to blood or other infectious body fluids, are household contacts or sex partners of hepatitis B positive people, are clients or workers in certain care facilities, or travel to or work in countries with a high rate of hepatitis B should be immunized.  Haemophilus influenzae type b (Hib) vaccine. A previously unvaccinated person with asplenia or sickle cell disease or having a scheduled splenectomy should receive 1 dose of Hib vaccine. Regardless of previous immunization, a recipient of a hematopoietic stem cell transplant should receive a  3-dose series 6-12 months after her successful transplant. Hib vaccine is not recommended for adults with HIV infection. Preventive Services / Frequency Ages 35 to 4 years  Blood pressure check.** / Every 3-5 years.  Lipid and cholesterol check.** / Every 5 years beginning at age 60.  Clinical breast exam.** / Every 3 years for women in their 71s and 10s.  BRCA-related cancer risk assessment.** / For women who have family members with a BRCA-related cancer (breast, ovarian, tubal, or peritoneal cancers).  Pap test.** / Every 2 years from ages 76 through 26. Every 3 years starting at age 61 through age 76 or 93 with a history of 3 consecutive normal Pap tests.  HPV screening.** / Every 3 years from ages 37 through ages 60 to 51 with a history of 3 consecutive normal Pap tests.  Hepatitis C blood test.** / For any individual with known risks for hepatitis C.  Skin self-exam. / Monthly.  Influenza vaccine. / Every year.  Tetanus, diphtheria, and acellular pertussis (Tdap, Td) vaccine.** / Consult your health care provider. Pregnant women should receive 1 dose of Tdap vaccine during each pregnancy. 1 dose of Td every 10 years.  Varicella vaccine.** / Consult your health care provider. Pregnant females who do not have evidence of immunity should receive the first dose after pregnancy.  HPV vaccine. / 3 doses over 6 months, if 93 and younger. The vaccine is not recommended for use in pregnant females. However, pregnancy testing is not needed before receiving a dose.  Measles, mumps, rubella (MMR) vaccine.** / You need at least 1 dose of MMR if you were born in 1957 or later. You may also need a 2nd dose. For females of childbearing age, rubella immunity should be determined. If there is no evidence of immunity, females who are not pregnant should be vaccinated. If there is no evidence of immunity, females who are  pregnant should delay immunization until after pregnancy.  Pneumococcal  13-valent conjugate (PCV13) vaccine.** / Consult your health care provider.  Pneumococcal polysaccharide (PPSV23) vaccine.** / 1 to 2 doses if you smoke cigarettes or if you have certain conditions.  Meningococcal vaccine.** / 1 dose if you are age 68 to 8 years and a Market researcher living in a residence hall, or have one of several medical conditions, you need to get vaccinated against meningococcal disease. You may also need additional booster doses.  Hepatitis A vaccine.** / Consult your health care provider.  Hepatitis B vaccine.** / Consult your health care provider.  Haemophilus influenzae type b (Hib) vaccine.** / Consult your health care provider. Ages 7 to 53 years  Blood pressure check.** / Every year.  Lipid and cholesterol check.** / Every 5 years beginning at age 25 years.  Lung cancer screening. / Every year if you are aged 11-80 years and have a 30-pack-year history of smoking and currently smoke or have quit within the past 15 years. Yearly screening is stopped once you have quit smoking for at least 15 years or develop a health problem that would prevent you from having lung cancer treatment.  Clinical breast exam.** / Every year after age 48 years.  BRCA-related cancer risk assessment.** / For women who have family members with a BRCA-related cancer (breast, ovarian, tubal, or peritoneal cancers).  Mammogram.** / Every year beginning at age 41 years and continuing for as long as you are in good health. Consult with your health care provider.  Pap test.** / Every 3 years starting at age 65 years through age 37 or 70 years with a history of 3 consecutive normal Pap tests.  HPV screening.** / Every 3 years from ages 72 years through ages 60 to 40 years with a history of 3 consecutive normal Pap tests.  Fecal occult blood test (FOBT) of stool. / Every year beginning at age 21 years and continuing until age 5 years. You may not need to do this test if you get  a colonoscopy every 10 years.  Flexible sigmoidoscopy or colonoscopy.** / Every 5 years for a flexible sigmoidoscopy or every 10 years for a colonoscopy beginning at age 35 years and continuing until age 48 years.  Hepatitis C blood test.** / For all people born from [birth year] through 1965 and any individual with known risks for hepatitis C.  Skin self-exam. / Monthly.  Influenza vaccine. / Every year.  Tetanus, diphtheria, and acellular pertussis (Tdap/Td) vaccine.** / Consult your health care provider. Pregnant women should receive 1 dose of Tdap vaccine during each pregnancy. 1 dose of Td every 10 years.  Varicella vaccine.** / Consult your health care provider. Pregnant females who do not have evidence of immunity should receive the first dose after pregnancy.  Zoster vaccine.** / 1 dose for adults aged 30 years or older.  Measles, mumps, rubella (MMR) vaccine.** / You need at least 1 dose of MMR if you were born in 1957 or later. You may also need a second dose. For females of childbearing age, rubella immunity should be determined. If there is no evidence of immunity, females who are not pregnant should be vaccinated. If there is no evidence of immunity, females who are pregnant should delay immunization until after pregnancy.  Pneumococcal 13-valent conjugate (PCV13) vaccine.** / Consult your health care provider.  Pneumococcal polysaccharide (PPSV23) vaccine.** / 1 to 2 doses if you smoke cigarettes or if you have certain conditions.  Meningococcal vaccine.** /  Consult your health care provider.  Hepatitis A vaccine.** / Consult your health care provider.  Hepatitis B vaccine.** / Consult your health care provider.  Haemophilus influenzae type b (Hib) vaccine.** / Consult your health care provider. Ages 21 years and over  Blood pressure check.** / Every year.  Lipid and cholesterol check.** / Every 5 years beginning at age 31 years.  Lung cancer screening. / Every year if you  are aged 70-80 years and have a 30-pack-year history of smoking and currently smoke or have quit within the past 15 years. Yearly screening is stopped once you have quit smoking for at least 15 years or develop a health problem that would prevent you from having lung cancer treatment.  Clinical breast exam.** / Every year after age 65 years.  BRCA-related cancer risk assessment.** / For women who have family members with a BRCA-related cancer (breast, ovarian, tubal, or peritoneal cancers).  Mammogram.** / Every year beginning at age 32 years and continuing for as long as you are in good health. Consult with your health care provider.  Pap test.** / Every 3 years starting at age 81 years through age 14 or 28 years with 3 consecutive normal Pap tests. Testing can be stopped between 65 and 70 years with 3 consecutive normal Pap tests and no abnormal Pap or HPV tests in the past 10 years.  HPV screening.** / Every 3 years from ages 75 years through ages 5 or 60 years with a history of 3 consecutive normal Pap tests. Testing can be stopped between 65 and 70 years with 3 consecutive normal Pap tests and no abnormal Pap or HPV tests in the past 10 years.  Fecal occult blood test (FOBT) of stool. / Every year beginning at age 93 years and continuing until age 65 years. You may not need to do this test if you get a colonoscopy every 10 years.  Flexible sigmoidoscopy or colonoscopy.** / Every 5 years for a flexible sigmoidoscopy or every 10 years for a colonoscopy beginning at age 72 years and continuing until age 33 years.  Hepatitis C blood test.** / For all people born from [birth year] through 1967 and any individual with known risks for hepatitis C.  Osteoporosis screening.** / A one-time screening for women ages 37 years and over and women at risk for fractures or osteoporosis.  Skin self-exam. / Monthly.  Influenza vaccine. / Every year.  Tetanus, diphtheria, and acellular pertussis (Tdap/Td)  vaccine.** / 1 dose of Td every 10 years.  Varicella vaccine.** / Consult your health care provider.  Zoster vaccine.** / 1 dose for adults aged 4 years or older.  Pneumococcal 13-valent conjugate (PCV13) vaccine.** / Consult your health care provider.  Pneumococcal polysaccharide (PPSV23) vaccine.** / 1 dose for all adults aged 4 years and older.  Meningococcal vaccine.** / Consult your health care provider.  Hepatitis A vaccine.** / Consult your health care provider.  Hepatitis B vaccine.** / Consult your health care provider.  Haemophilus influenzae type b (Hib) vaccine.** / Consult your health care provider. ** Family history and personal history of risk and conditions may change your health care provider's recommendations.   This information is not intended to replace advice given to you by your health care provider. Make sure you discuss any questions you have with your health care provider.   Document Released: 08/29/2001 Document Revised: 07/24/2014 Document Reviewed: 11/28/2010 Elsevier Interactive Patient Education [employer].

## 2015-07-23 NOTE — Progress Notes (Signed)
Pre visit review using our clinic review tool, if applicable. No additional management support is needed unless otherwise documented below in the visit note. 

## 2015-07-26 ENCOUNTER — Other Ambulatory Visit: Payer: Self-pay | Admitting: Pharmacist

## 2015-07-26 ENCOUNTER — Other Ambulatory Visit: Payer: Self-pay | Admitting: Family Medicine

## 2015-07-26 ENCOUNTER — Encounter: Payer: Self-pay | Admitting: Pharmacist

## 2015-07-26 NOTE — Patient Outreach (Signed)
[facility]) Care Management  [place]   07/26/2015  Donna Pham 1956/02/19 [id number]  Subjective: Donna Pham is a 60yo who was referred to [place] for medication review and medication assistance. I made outreach call to patient to review her medications and assess for pharmacy needs. Patient denies difficulty affording her medications and reports compliance with medications.  Patient denies any questions or concerns regarding her medications.  Her one concern was that she is out of test strips and her Pham recent medication delivery from [facility] mail order did not include test strips.  I was able to confirm that a prescription for test strips was refilled by her primary care provider on 07/21/15 and prescription was sent to [facility] mail order.  Patient called [employer] mail order and confirmed that test strips would be delivered today.    Objective:   Current Medications: Current Outpatient Prescriptions  Medication Sig Dispense Refill  . ACCU-CHEK FASTCLIX LANCETS MISC Check blood sugar twice daily 200 each 3  . ACCU-CHEK SMARTVIEW test strip CHECK BLOOD SUGAR TWICE DAILY 200 each 3  . Alcohol Swabs (B-D SINGLE USE SWABS REGULAR) PADS Check blood sugar twice daily 200 each 3  . aspirin 81 MG tablet Take 81 mg by mouth daily.      . Blood Glucose Monitoring Suppl (ACCU-CHEK NANO SMARTVIEW) W/DEVICE KIT Check blood sugar as directed 1 kit 0  . esomeprazole (NEXIUM) 40 MG capsule Take 1 capsule (40 mg total) by mouth daily before breakfast. 90 capsule 3  . glipiZIDE (GLUCOTROL XL) 10 MG 24 hr tablet Please take 1x a day in am 90 tablet 3  . Insulin Pen Needle (BD PEN NEEDLE NANO U/F) 32G X 4 MM MISC USE AS DIRECTED ONCE DAILY 100 each 1  . metFORMIN (GLUCOPHAGE XR) 500 MG 24 hr tablet Take 4 tablets (2,000 mg total) by mouth daily with supper. 360 tablet 3  . rosuvastatin (CRESTOR) 20 MG tablet Take 1.5 tablets (30 mg total) by mouth daily. (Patient  taking differently: Take 20 mg by mouth daily. ) 135 tablet 0  . saxagliptin HCl (ONGLYZA) 5 MG TABS tablet Take 1 tablet (5 mg total) by mouth daily. Repeat labs are due now 90 tablet 0  . sertraline (ZOLOFT) 100 MG tablet Take 2 tablets (200 mg total) by mouth daily. 180 tablet 1  . TRESIBA FLEXTOUCH 200 UNIT/ML SOPN INJECT 14 UNITS INTO THE SKIN AT BEDTIME 9 pen 0  . albuterol (PROVENTIL HFA;VENTOLIN HFA) 108 (90 BASE) MCG/ACT inhaler Inhale 2 puffs into the lungs every 6 (six) hours as needed for wheezing or shortness of breath. (Patient not taking: Reported on 07/26/2015) 1 Inhaler 0  . albuterol (PROVENTIL) (2.5 MG/3ML) 0.083% nebulizer solution Take 3 mLs (2.5 mg total) by nebulization 4 (four) times daily as needed for wheezing. (Patient not taking: Reported on 07/26/2015) 150 mL 1  . beclomethasone (QVAR) 40 MCG/ACT inhaler Inhale 2 puffs into the lungs 2 (two) times daily. (Patient not taking: Reported on 07/26/2015) 1 Inhaler 2  . budesonide-formoterol (SYMBICORT) 80-4.5 MCG/ACT inhaler Inhale 2 puffs into the lungs 2 (two) times daily. (Patient not taking: Reported on 07/26/2015) 1 Inhaler 3  . fenofibrate 160 MG tablet Take 1 tablet (160 mg total) by mouth daily. (Patient not taking: Reported on 07/26/2015) 90 tablet 1  . mirabegron ER (MYRBETRIQ) 50 MG TB24 tablet Take 1 tablet (50 mg total) by mouth daily. (Patient not taking: Reported on 07/26/2015) 30 tablet 5  No current facility-administered medications for this visit.    Functional Status: In your present state of health, do you have any difficulty performing the following activities: 07/25/2015 03/11/2015  Hearing? N N  Vision? N N  Difficulty concentrating or making decisions? N N  Walking or climbing stairs? N N  Dressing or bathing? N N  Doing errands, shopping? N N   Fall/Depression Screening: PHQ 2/9 Scores 07/25/2015  PHQ - 2 Score 0    Assessment: Medication review:   Drugs sorted by system:  Neurologic/Psychologic:  sertraline  Cardiovascular: aspirin, rosuvastatin, fenofibrate (patient reports she is not taking fenofibrate)    Pulmonary/Allergy: albuterol HFA and nebulizer, QVAR, Symbicort - patient reports she is not using any of these medications  Gastrointestinal: esomeprazole   Endocrine: glipizide, metformin, saxagliptin, Tresiba  Renal: mirabegron (patient reports she is not taking mirabegron)   Topical: none   Pain: none   Vitamins/Minerals: none   Infectious Diseases: none   Miscellaneous: none    Duplications in therapy: none noted Gaps in therapy: none (patient has diabetes but normal blood pressure, normal GFR, and MA/Cr ratio <30 therefore ACEI not indicated at this time)  Medications to avoid in the elderly: N/A, pt <65yo Drug interactions: none noted  Other issues noted:  Patient reports she is no longer taking the following maintenance medications on her medication list:  QVAR, Symbicort, fenofibrate, mirabegron and she is not using albuterol HFA or nebulizer.     Plan: 1.  Medication review:  All medications appear appropriate based on patient's problem list.  Will update patient's primary care provider that patient reports she is no longer taking the following medications: QVAR, Symbicort, fenofibrate, mirabegron, albuterol HFA or nebulizer.  No other issues noted as patient called [employer] and confirmed that test strips will be delivered today.   2.  Patient denies any further pharmacy needs at this time.  Will close pharmacy program.  Provided patient with my phone number and encouraged her to call me if she has any questions or concerns.  Will alert [place] of pharmacy program closure.     Donna Pham, Pharm.D. Pharmacy Resident [place] [phone]

## 2015-07-27 LAB — CYTOLOGY - PAP

## 2015-07-28 ENCOUNTER — Other Ambulatory Visit: Payer: Self-pay | Admitting: Family Medicine

## 2015-07-28 ENCOUNTER — Telehealth: Payer: Self-pay | Admitting: *Deleted

## 2015-07-28 NOTE — Telephone Encounter (Signed)
Received fax from pharmacy requesting PA for Rosuvastatin 20mg ; initiated via Cover My Meds; awaiting response/SLS

## 2015-07-30 NOTE — Telephone Encounter (Signed)
Received fax from [facility] with Approval for PA on Rosuvastatin mg, valid through 07/28/2016; faxed to pharmacy at [phone]/SLS 01/13

## 2015-08-03 ENCOUNTER — Encounter: Payer: Self-pay | Admitting: *Deleted

## 2015-08-03 ENCOUNTER — Other Ambulatory Visit: Payer: Self-pay | Admitting: *Deleted

## 2015-08-03 NOTE — Patient Outreach (Signed)
[facility]) Care Management  08/03/2015  Donna Pham Sep 17, 1955 [id number]   SMart Piggs home visit for care management services. I have been seeing her husband for several months and was advised that Donna Pham had also been referred also. Donna Pham is independent and her husband's caregiver. They have a strained relationship from what I have observed. Donna Pham admits to not following a diabetic diet, nor taking all of her meds. The meds she states she is not taking are all the inhalers: QVAR, Symbicort and Albuterol. She also does not take fenofibrate although she has a large supply, and she never picked up her Rx for myrbetriq. Pt reports her main health issues are diabetes, hyperlipidemia and GERD.  Outpatient Encounter Prescriptions as of 08/03/2015  Medication Sig Note  . ACCU-CHEK FASTCLIX LANCETS MISC Check blood sugar twice daily   . ACCU-CHEK SMARTVIEW test strip CHECK BLOOD SUGAR TWICE DAILY   . Alcohol Swabs (B-D SINGLE USE SWABS REGULAR) PADS Check blood sugar twice daily   . aspirin 81 MG tablet Take 81 mg by mouth daily.     Marland Kitchen esomeprazole (NEXIUM) 40 MG capsule Take 1 capsule (40 mg total) by mouth daily before breakfast.   . fenofibrate 160 MG tablet Take 1 tablet (160 mg total) by mouth daily. 07/26/2015: Patient reports she has not taken this in over 6 months  . glipiZIDE (GLUCOTROL XL) 10 MG 24 hr tablet Please take 1x a day in am   . metFORMIN (GLUCOPHAGE-XR) 500 MG 24 hr tablet TAKE 4 TABLETS DAILY WITH SUPPER   . rosuvastatin (CRESTOR) 20 MG tablet TAKE 1 AND 1/2 TABLETS BY MOUTH DAILY   . saxagliptin HCl (ONGLYZA) 5 MG TABS tablet Take 1 tablet (5 mg total) by mouth daily. Repeat labs are due now   . sertraline (ZOLOFT) 100 MG tablet Take 2 tablets (200 mg total) by mouth daily.   . [DISCONTINUED] budesonide-formoterol (SYMBICORT) 80-4.5 MCG/ACT inhaler Inhale 2 puffs into the lungs 2 (two) times daily. 07/26/2015: Patient reports she has  not used Symbicort inhaler in several years  . Blood Glucose Monitoring Suppl (ACCU-CHEK NANO SMARTVIEW) W/DEVICE KIT Check blood sugar as directed   . Insulin Pen Needle (BD PEN NEEDLE NANO U/F) 32G X 4 MM MISC USE AS DIRECTED ONCE DAILY   . TRESIBA FLEXTOUCH 200 UNIT/ML SOPN INJECT 14 UNITS INTO THE SKIN AT BEDTIME   . [DISCONTINUED] albuterol (PROVENTIL HFA;VENTOLIN HFA) 108 (90 BASE) MCG/ACT inhaler Inhale 2 puffs into the lungs every 6 (six) hours as needed for wheezing or shortness of breath. (Patient not taking: Reported on 07/26/2015)   . [DISCONTINUED] albuterol (PROVENTIL) (2.5 MG/3ML) 0.083% nebulizer solution Take 3 mLs (2.5 mg total) by nebulization 4 (four) times daily as needed for wheezing. (Patient not taking: Reported on 07/26/2015)   . [DISCONTINUED] beclomethasone (QVAR) 40 MCG/ACT inhaler Inhale 2 puffs into the lungs 2 (two) times daily. (Patient not taking: Reported on 07/26/2015) 07/26/2015: Patient reports she has not used in over a year  . [DISCONTINUED] mirabegron ER (MYRBETRIQ) 50 MG TB24 tablet Take 1 tablet (50 mg total) by mouth daily. (Patient not taking: Reported on 07/26/2015)   . [DISCONTINUED] rosuvastatin (CRESTOR) 20 MG tablet Take 1.5 tablets (30 mg total) by mouth daily. (Patient not taking: Reported on 08/04/2015)    No facility-administered encounter medications on file as of 08/03/2015.     O:  BP 132/70 mmHg  Pulse 95  Resp 16  Wt 220 lb (99.791 kg)  SpO2 97% FBS 245       RRR       Lungs are clear  A:  Diabetes not well controlled      Hyperlipidemia      GERD  P:  We discussed her need to learn about carb counting so she can reach better control of her               diabetes and loose weight.      Need to remove meds she isn't taking from present med list. I believe she still needs to continue               her fenofibrate.  Fall Risk  08/03/2015 07/25/2015  Falls in the past year? Yes No  Number falls in past yr: 2 or more -  Injury with Fall? Yes -   Risk Factor Category  High Fall Risk -  Risk for fall due to : History of fall(s);Impaired balance/gait;Impaired mobility;Medication side effect -  Follow up Falls evaluation completed;Education provided;Falls prevention discussed -   Depression screen [facility] 2/9 08/03/2015 07/25/2015  Decreased Interest 1 0  Down, Depressed, Hopeless 2 0  PHQ - 2 Score 3 0  Altered sleeping 1 -  Tired, decreased energy 3 -  Change in appetite 0 -  Feeling bad or failure about yourself  1 -  Trouble concentrating 0 -  Moving slowly or fidgety/restless 0 -  Suicidal thoughts 0 -  PHQ-9 Score 8 -  Difficult doing work/chores Not difficult at all -   Donna Pham [facility] [place] [phone]

## 2015-08-04 ENCOUNTER — Encounter: Payer: Self-pay | Admitting: *Deleted

## 2015-08-06 ENCOUNTER — Other Ambulatory Visit: Payer: Self-pay | Admitting: Family Medicine

## 2015-09-02 ENCOUNTER — Other Ambulatory Visit: Payer: Self-pay

## 2015-09-02 MED ORDER — GLIPIZIDE ER 10 MG PO TB24: ORAL_TABLET | ORAL | Status: DC

## 2015-09-07 ENCOUNTER — Ambulatory Visit: Payer: Self-pay | Admitting: *Deleted

## 2015-09-07 ENCOUNTER — Encounter: Payer: Self-pay | Admitting: *Deleted

## 2015-09-07 ENCOUNTER — Other Ambulatory Visit: Payer: Self-pay | Admitting: *Deleted

## 2015-09-07 NOTE — Patient Outreach (Addendum)
S:  Pt initially tried to make some changes in diet and writting down what she has eaten. She hasn't been able to reduce the carbs in her diet, just too used to eating waffles every day for breakfast. Her am readings are very good but her pm readings are in the 200 mg/Dl. She is taking her meds correctly, although she has been out of glipizide for 10 days now. She has called [employer] Order 2 times.  Pt demonstrates an open blister on her L foot from ill sitting shoes.  O:  BP 120/80 mmHg  Pulse 60  Resp 16  Wt 220 lb (99.791 kg)  SpO2 98% NFBS 159       RRR       Lungs clear       Foot wound - Medial aspect of First metatarsal - superficial open blister from wearing shoes that are too tight  A:  Diabetes       L foot wound  P: Dr. Etter Sjogren I have pt an Rx for diabetic shoes on your behalf, also Mr. Worm. Suggested going to the [employer]       [facility] or [employer] for shoes.      Gave Rx for Accucheck glucose strips. Pt missed place 2 bottles of same.  Instructed pt to wash foot wound with soap and water, dry, apply small amount of antibiotic ointment and cover with bandage. Do Not Wear shoes that do not accommodate her feet properly.  Pt to call if her glipizide does not arrive by Friday and I will call in an Rx to [facility].  Discussed need to reduce carbs. Pt says she will stop eating waffles and loose 2# in next 3 weeks. I have informed her if she does not try to reach her goals I will have to close her case.  She is agreeable to make some changes.  Deloria Lair [facility] [place] [phone]

## 2015-09-12 ENCOUNTER — Other Ambulatory Visit: Payer: Self-pay | Admitting: Internal Medicine

## 2015-09-17 ENCOUNTER — Other Ambulatory Visit: Payer: Self-pay | Admitting: Family Medicine

## 2015-09-28 ENCOUNTER — Other Ambulatory Visit: Payer: Self-pay | Admitting: *Deleted

## 2015-09-28 NOTE — Patient Outreach (Signed)
S: Pt apologizes for involving me in the couples argument the last time Mr. Wester called me. They appear to be       getting along better today. Sharyl reports she has not made much progress on her diet but she did give up the       waffles for breakfast every morning. She says it is difficult to make progress with all the stress between she and       her husband.  O: BP 120/80 mmHg  Pulse 95  Resp 18  SpO2 98%.NFBS: 247      RRR      Lungs clear  A:  Anxiety      DM  P:  Listened to and supported Mrs. Hoelzel. Encouraged her to set limits with her husband. Encourage him to do             what he can independently. Be assertive in asking him to treat her with respect and kindness.       Encoiuraged her to continue to make some efforts in her diabetes diet management.        I want to close her case, not making real efforts. They are trying to use me as a marriage counselor and I        am not, I have suggested counseling and I have referred them back to our Education officer, museum for resources. I        will call next  month and close.  THN CM Care Plan Problem One        Most Recent Value   Care Plan Problem One  Pt will work to acheive Hgb A1C <= 7.0   Role Documenting the Problem One  Care Management [place] for Problem One  Active   THN Long Term Goal (31-90 days)  Pt will lose 10 pounds over the next 90 days.   Interventions for Problem One Long Term Goal  Encouraged her efforts to improve her health and diet.   THN CM Short Term Goal #1 (0-30 days)  Pt will write down all food in diary over next 30 days.   THN CM Short Term Goal #1 Start Date  08/03/15   Interventions for Short Term Goal #1  Not meet - not realistic, too much stress in her life.   THN CM Short Term Goal #2 (0-30 days)  Pt to pick out low carb snacks for her next shopping trip and report to me in 30 days.   THN CM Short Term Goal #2 Start Date  08/03/15   Interventions for Short Term Goal #2  Not  met. Reinforced the need to do so.       Deloria Lair [facility] [place] [phone]

## 2015-10-13 ENCOUNTER — Other Ambulatory Visit: Payer: Self-pay | Admitting: Internal Medicine

## 2015-10-26 ENCOUNTER — Encounter: Payer: Self-pay | Admitting: *Deleted

## 2015-10-26 ENCOUNTER — Other Ambulatory Visit: Payer: Self-pay | Admitting: *Deleted

## 2015-10-26 NOTE — Patient Outreach (Addendum)
I met with Mrs. and Mr.. Knodel at Dr. Georgie Chard office today to act as an advocate for Mr. Imhoff.  After the office visit the 3 of [place] chatted in the waiting room. I advised that I am closing their cases, as I have given them information and coaching. They are still struggling with their compliance on many levels: meds, diet and general check ups for their chronic illnesses. They know what they should do but choose to do otherwise.  I have enjoyed working with this couple and will maintain a friendship with them. I have told them they may call me for information or advise in the future.  THN CM Care Plan Problem One        Most Recent Value   Care Plan Problem One  Pt will work to acheive Hgb A1C <= 7.0   Role Documenting the Problem One  Care Management [place] for Problem One  Not Active   THN Long Term Goal (31-90 days)  Pt will lose 10 pounds over the next 90 days.   Interventions for Problem One Long Term Goal  Goal was not met but I encouraged her efforts to improve her health and diet.   THN CM Short Term Goal #1 (0-30 days)  Pt will write down all food in diary over next 30 days.   THN CM Short Term Goal #1 Start Date  08/03/15   Interventions for Short Term Goal #1  Not meet - not realistic, too much stress in her life.   THN CM Short Term Goal #2 (0-30 days)  Pt to pick out low carb snacks for her next shopping trip and report to me in 30 days.   THN CM Short Term Goal #2 Start Date  08/03/15   Interventions for Short Term Goal #2  Not met. Reinforced the need to do so.     Deloria Lair [facility] [place] [phone]

## 2015-10-27 ENCOUNTER — Encounter: Payer: Self-pay | Admitting: *Deleted

## 2015-11-03 ENCOUNTER — Other Ambulatory Visit: Payer: Self-pay

## 2015-11-03 MED ORDER — METFORMIN HCL ER 500 MG PO TB24: ORAL_TABLET | ORAL | Status: DC

## 2015-11-04 ENCOUNTER — Other Ambulatory Visit: Payer: Self-pay

## 2015-11-04 MED ORDER — ACCU-CHEK FASTCLIX LANCETS MISC: Status: DC

## 2015-11-04 MED ORDER — GLUCOSE BLOOD VI STRP: ORAL_STRIP | Status: DC

## 2015-12-09 ENCOUNTER — Other Ambulatory Visit: Payer: Self-pay

## 2015-12-09 MED ORDER — GLIPIZIDE ER 10 MG PO TB24: ORAL_TABLET | ORAL | Status: DC

## 2016-01-06 ENCOUNTER — Ambulatory Visit: Payer: Commercial Managed Care - HMO | Admitting: Family Medicine

## 2016-01-17 ENCOUNTER — Telehealth: Payer: Self-pay | Admitting: Family Medicine

## 2016-01-17 MED ORDER — BETAMETHASONE DIPROPIONATE 0.05 % EX CREA: TOPICAL_CREAM | Freq: Two times a day (BID) | CUTANEOUS | Status: AC

## 2016-01-17 NOTE — Telephone Encounter (Signed)
Please advise 

## 2016-01-17 NOTE — Telephone Encounter (Signed)
Pt called in because she was doing yard work and think that she has poison ivy. Pt would like to know if an ointment could be called in to pharmacy for her considering that she has had this reaction before after doing yard work.   Please advise.    Pharmacy: [id number] - [place], [place]    CB: [phone]

## 2016-01-17 NOTE — Telephone Encounter (Signed)
Contacted pt. She was not home but notified husband that I sent rx for patient.

## 2016-01-19 ENCOUNTER — Telehealth: Payer: Self-pay | Admitting: *Deleted

## 2016-01-19 ENCOUNTER — Other Ambulatory Visit: Payer: Self-pay | Admitting: Family Medicine

## 2016-01-19 NOTE — Telephone Encounter (Signed)
PA initiated on [email or web address]  KeyAleen Sells PA Case ID: [id number]  Awaiting determination

## 2016-01-19 NOTE — Telephone Encounter (Signed)
Rx sent to the pharmacy by e-script.//AB/CMA 

## 2016-01-21 NOTE — Telephone Encounter (Signed)
PA Case [id number] is Approved. For further questions, call [phone].

## 2016-01-28 ENCOUNTER — Encounter: Payer: Self-pay | Admitting: Family Medicine

## 2016-01-28 ENCOUNTER — Ambulatory Visit (INDEPENDENT_AMBULATORY_CARE_PROVIDER_SITE_OTHER): Payer: Medicare Other | Admitting: Family Medicine

## 2016-01-28 VITALS — BP 126/84 | HR 78 | Temp 98.1°F | Ht 64.0 in | Wt 219.2 lb

## 2016-01-28 DIAGNOSIS — E785 Hyperlipidemia, unspecified: Secondary | ICD-10-CM | POA: Diagnosis not present

## 2016-01-28 DIAGNOSIS — M545 Low back pain: Secondary | ICD-10-CM

## 2016-01-28 DIAGNOSIS — R82998 Other abnormal findings in urine: Secondary | ICD-10-CM

## 2016-01-28 DIAGNOSIS — R002 Palpitations: Secondary | ICD-10-CM | POA: Diagnosis not present

## 2016-01-28 DIAGNOSIS — R319 Hematuria, unspecified: Secondary | ICD-10-CM

## 2016-01-28 DIAGNOSIS — E1165 Type 2 diabetes mellitus with hyperglycemia: Secondary | ICD-10-CM

## 2016-01-28 DIAGNOSIS — N390 Urinary tract infection, site not specified: Secondary | ICD-10-CM | POA: Diagnosis not present

## 2016-01-28 DIAGNOSIS — IMO0002 Reserved for concepts with insufficient information to code with codable children: Secondary | ICD-10-CM

## 2016-01-28 DIAGNOSIS — E1151 Type 2 diabetes mellitus with diabetic peripheral angiopathy without gangrene: Secondary | ICD-10-CM | POA: Diagnosis not present

## 2016-01-28 DIAGNOSIS — R809 Proteinuria, unspecified: Secondary | ICD-10-CM

## 2016-01-28 LAB — COMPREHENSIVE METABOLIC PANEL
ALT: 11 U/L (ref 0–35)
AST: 14 U/L (ref 0–37)
Albumin: 4.1 g/dL (ref 3.5–5.2)
Alkaline Phosphatase: 73 U/L (ref 39–117)
BUN: 18 mg/dL (ref 6–23)
CO2: 29 mEq/L (ref 19–32)
Calcium: 9.3 mg/dL (ref 8.4–10.5)
Chloride: 103 mEq/L (ref 96–112)
Creatinine, Ser: 0.81 mg/dL (ref 0.40–1.20)
GFR: 76.21 mL/min (ref >60.00)
Glucose, Bld: 144 mg/dL — ABNORMAL HIGH (ref 70–99)
Potassium: 4 mEq/L (ref 3.5–5.1)
Sodium: 139 mEq/L (ref 135–145)
Total Bilirubin: 0.5 mg/dL (ref 0.2–1.2)
Total Protein: 7.5 g/dL (ref 6.0–8.3)

## 2016-01-28 LAB — POC URINALSYSI DIPSTICK (AUTOMATED)
Blood, UA: 5.5
Glucose, UA: NEGATIVE
Ketones, UA: NEGATIVE
Nitrite, UA: NEGATIVE
Spec Grav, UA: >=1.03
Urobilinogen, UA: 2
pH, UA: 5.5

## 2016-01-28 LAB — LIPID PANEL
Cholesterol: 142 mg/dL (ref 0–200)
HDL: 45.1 mg/dL (ref >39.00)
LDL Cholesterol: 71 mg/dL (ref 0–99)
NonHDL: 97.15
Total CHOL/HDL Ratio: 3
Triglycerides: 129 mg/dL (ref 0.0–149.0)
VLDL: 25.8 mg/dL (ref 0.0–40.0)

## 2016-01-28 LAB — HM DIABETES EYE EXAM

## 2016-01-28 LAB — CBC WITH DIFFERENTIAL/PLATELET
Basophils Absolute: 0.1 10*3/uL (ref 0.0–0.1)
Basophils Relative: 0.7 % (ref 0.0–3.0)
Eosinophils Absolute: 0 10*3/uL (ref 0.0–0.7)
Eosinophils Relative: 0.3 % (ref 0.0–5.0)
HCT: 42.5 % (ref 36.0–46.0)
Hemoglobin: 14.1 g/dL (ref 12.0–15.0)
Lymphocytes Relative: 32.7 % (ref 12.0–46.0)
Lymphs Abs: 3.6 10*3/uL (ref 0.7–4.0)
MCHC: 33.2 g/dL (ref 30.0–36.0)
MCV: 92.7 fl (ref 78.0–100.0)
Monocytes Absolute: 0.8 10*3/uL (ref 0.1–1.0)
Monocytes Relative: 7.5 % (ref 3.0–12.0)
Neutro Abs: 6.5 10*3/uL (ref 1.4–7.7)
Neutrophils Relative %: 58.8 % (ref 43.0–77.0)
Platelets: 251 10*3/uL (ref 150.0–400.0)
RBC: 4.59 Mil/uL (ref 3.87–5.11)
RDW: 13.2 % (ref 11.5–15.5)
WBC: 11.1 10*3/uL — ABNORMAL HIGH (ref 4.0–10.5)

## 2016-01-28 LAB — MICROALBUMIN / CREATININE URINE RATIO
Creatinine,U: 357.6 mg/dL
Microalb Creat Ratio: 1 mg/g (ref 0.0–30.0)
Microalb, Ur: 3.5 mg/dL — ABNORMAL HIGH (ref 0.0–1.9)

## 2016-01-28 LAB — HEMOGLOBIN A1C: Hgb A1c MFr Bld: 8.5 % — ABNORMAL HIGH (ref 4.6–6.5)

## 2016-01-28 LAB — TSH: TSH: 1.09 u[IU]/mL (ref 0.35–4.50)

## 2016-01-28 MED ORDER — SAXAGLIPTIN HCL 5 MG PO TABS: 5.0000 mg | ORAL_TABLET | Freq: Every day | ORAL | Status: DC

## 2016-01-28 MED ORDER — CIPROFLOXACIN HCL 250 MG PO TABS: 250.0000 mg | ORAL_TABLET | Freq: Two times a day (BID) | ORAL | Status: DC

## 2016-01-28 NOTE — Patient Instructions (Signed)

## 2016-01-28 NOTE — Progress Notes (Signed)
Pre visit review using our clinic review tool, if applicable. No additional management support is needed unless otherwise documented below in the visit note. 

## 2016-01-28 NOTE — Progress Notes (Signed)
Patient ID: Donna Pham, female    DOB: 01/01/1956  Age: 60 y.o. MRN: [id number]    Subjective:  Subjective HPI [name] presents for L flank pain and palpitations x few weeks.  Things are very stressful at home-- her husband is in hospice.  Pt also needs her labs drawn.    Review of Systems  Constitutional: Negative for diaphoresis, appetite change, fatigue and unexpected weight change.  Eyes: Negative for pain, redness and visual disturbance.  Respiratory: Negative for cough, chest tightness, shortness of breath and wheezing.   Cardiovascular: Negative for chest pain, palpitations and leg swelling.  Endocrine: Negative for cold intolerance, heat intolerance, polydipsia, polyphagia and polyuria.  Genitourinary: Positive for flank pain. Negative for dysuria, frequency and difficulty urinating.  Neurological: Negative for dizziness, light-headedness, numbness and headaches.    History Past Medical History  Diagnosis Date  . Depression   . Diabetes mellitus   . GERD (gastroesophageal reflux disease)   . Hyperlipidemia   . Hypertension   . CTS (carpal tunnel syndrome)     6/15-19/09 RLL PNA with sepsis high LFTs (ALT 61)  . Arthritis   . Right upper quadrant abdominal pain     She has past surgical history that includes Eye surgery.   Her family history includes Arthritis in her father; Cancer in her father; Diabetes in her mother.She reports that she has never smoked. She has never used smokeless tobacco. She reports that she does not drink alcohol or use illicit drugs.  Current Outpatient Prescriptions on File Prior to Visit  Medication Sig Dispense Refill  . ACCU-CHEK FASTCLIX LANCETS MISC Check blood sugar twice daily 200 each 5  . Alcohol Swabs (B-D SINGLE USE SWABS REGULAR) PADS Check blood sugar twice daily 200 each 3  . aspirin 81 MG tablet Take 81 mg by mouth daily.      . betamethasone dipropionate (DIPROLENE) 0.05 % cream Apply topically 2 (two) times  daily. 30 g 0  . Blood Glucose Monitoring Suppl (ACCU-CHEK NANO SMARTVIEW) W/DEVICE KIT Check blood sugar as directed 1 kit 0  . esomeprazole (NEXIUM) 40 MG capsule TAKE 1 CAPSULE BY MOUTH EVERY DAY BEFORE BREAKFAST 90 capsule 1  . fenofibrate 160 MG tablet Take 1 tablet (160 mg total) by mouth daily. 90 tablet 1  . glipiZIDE (GLUCOTROL XL) 10 MG 24 hr tablet take 1 tablet by mouth once daily 90 tablet 3  . glucose blood (ACCU-CHEK SMARTVIEW) test strip CHECK BLOOD SUGAR TWICE DAILY 200 each 5  . Insulin Degludec (TRESIBA FLEXTOUCH) 200 UNIT/ML SOPN Inject 14 Units into the skin at bedtime. **LAST REFILL**PT NEEDS APPT** 9 pen 0  . Insulin Pen Needle (BD PEN NEEDLE NANO U/F) 32G X 4 MM MISC USE AS DIRECTED ONCE DAILY 100 each 1  . metFORMIN (GLUCOPHAGE-XR) 500 MG 24 hr tablet TAKE 4 TABLETS DAILY WITH SUPPER 360 tablet 3  . rosuvastatin (CRESTOR) 20 MG tablet TAKE 1 AND 1/2 TABLETS BY MOUTH DAILY 135 tablet 1  . sertraline (ZOLOFT) 100 MG tablet Take 2 tablets (200 mg total) by mouth daily. 180 tablet 1   No current facility-administered medications on file prior to visit.     Objective:  Objective Physical Exam  Constitutional: She is oriented to person, place, and time. She appears well-developed and well-nourished.  HENT:  Head: Normocephalic and atraumatic.  Eyes: Conjunctivae and EOM are normal.  Neck: Normal range of motion. Neck supple. No JVD present. Carotid bruit is not present. No thyromegaly present.  Cardiovascular: Normal rate, regular rhythm and normal heart sounds.   No murmur heard. Pulmonary/Chest: Effort normal and breath sounds normal. No respiratory distress. She has no wheezes. She has no rales. She exhibits no tenderness.  Musculoskeletal: She exhibits no edema.  Neurological: She is alert and oriented to person, place, and time.  Psychiatric: She has a normal mood and affect.  Nursing note and vitals reviewed.  BP 126/84 mmHg  Pulse 78  Temp(Src) 98.1 F (36.7  C) (Oral)  Ht _0  (1.626 m)  Wt 219 lb 3.2 oz (99.428 kg)  BMI 37.61 kg/m2  SpO2 97% Wt Readings from Last 3 Encounters:  01/28/16 219 lb 3.2 oz (99.428 kg)  09/07/15 220 lb (99.791 kg)  08/03/15 220 lb (99.791 kg)     Lab Results  Component Value Date   WBC 11.2* 07/23/2015   HGB 14.5 07/23/2015   HCT 43.9 07/23/2015   PLT 262.0 07/23/2015   GLUCOSE 148* 07/23/2015   CHOL 188 07/23/2015   TRIG 126.0 07/23/2015   HDL 49.50 07/23/2015   LDLDIRECT 167.2 08/22/2010   LDLCALC 113* 07/23/2015   ALT 14 07/23/2015   AST 16 07/23/2015   NA 138 07/23/2015   K 5.0 07/23/2015   CL 102 07/23/2015   CREATININE 0.82 07/23/2015   BUN 16 07/23/2015   CO2 30 07/23/2015   TSH 0.63 01/28/2013   HGBA1C 7.6* 07/23/2015   MICROALBUR 0.9 07/23/2015    Mm Digital Screening  07/26/2015  CLINICAL DATA:  Screening. EXAM: DIGITAL SCREENING BILATERAL MAMMOGRAM WITH CAD COMPARISON:  Previous exam(s). ACR Breast Density Category c: The breast tissue is heterogeneously dense, which may obscure small masses. FINDINGS: There are no findings suspicious for malignancy. Images were processed with CAD. IMPRESSION: No mammographic evidence of malignancy. A result letter of this screening mammogram will be mailed directly to the patient. RECOMMENDATION: Screening mammogram in one year. (Code:SM-B-01Y) BI-RADS CATEGORY  1: Negative. Electronically Signed   By: Franki Cabot M.D.   On: 07/26/2015 11:34     Assessment & Plan:  Plan I am having Ms. Tull start on ciprofloxacin. I am also having her maintain her aspirin, sertraline, fenofibrate, ACCU-CHEK NANO SMARTVIEW, B-D SINGLE USE SWABS REGULAR, Insulin Pen Needle, rosuvastatin, Insulin Degludec, metFORMIN, glucose blood, ACCU-CHEK FASTCLIX LANCETS, glipiZIDE, betamethasone dipropionate, esomeprazole, and saxagliptin HCl.  Meds ordered this encounter  Medications  . saxagliptin HCl (ONGLYZA) 5 MG TABS tablet    Sig: Take 1 tablet (5 mg total) by mouth  daily.    Dispense:  90 tablet    Refill:  1  . ciprofloxacin (CIPRO) 250 MG tablet    Sig: Take 1 tablet (250 mg total) by mouth 2 (two) times daily.    Dispense:  10 tablet    Refill:  0    Problem List Items Addressed This Visit      Unprioritized   Hyperlipidemia LDL goal <70   Relevant Orders   Comprehensive metabolic panel   Lipid panel   Microalbumin / creatinine urine ratio   Backache   Relevant Orders   POCT Urinalysis Dipstick (Automated) (Completed)   Urine Culture    Other Visit Diagnoses    DM (diabetes mellitus) type II uncontrolled, periph vascular disorder (HCC)    -  Primary    Relevant Medications    saxagliptin HCl (ONGLYZA) 5 MG TABS tablet    Other Relevant Orders    Comprehensive metabolic panel    CBC with Differential/Platelet    Hemoglobin A1c  Microalbumin / creatinine urine ratio    Leukocytes in urine        Relevant Orders    POCT Urinalysis Dipstick (Automated) (Completed)    Palpitations        Relevant Orders    EKG 12-Lead (Completed)    TSH    Hematuria        Relevant Medications    ciprofloxacin (CIPRO) 250 MG tablet       Follow-up: Return in about 6 months (around 07/30/2016), or if symptoms worsen or fail to improve.  Ann Held, DO

## 2016-01-30 LAB — URINE CULTURE: Colony Count: >=100000

## 2016-01-31 ENCOUNTER — Telehealth: Payer: Self-pay | Admitting: *Deleted

## 2016-01-31 DIAGNOSIS — E1165 Type 2 diabetes mellitus with hyperglycemia: Secondary | ICD-10-CM

## 2016-01-31 DIAGNOSIS — R809 Proteinuria, unspecified: Secondary | ICD-10-CM

## 2016-01-31 DIAGNOSIS — D72829 Elevated white blood cell count, unspecified: Secondary | ICD-10-CM

## 2016-01-31 DIAGNOSIS — E785 Hyperlipidemia, unspecified: Secondary | ICD-10-CM

## 2016-01-31 DIAGNOSIS — IMO0001 Reserved for inherently not codable concepts without codable children: Secondary | ICD-10-CM

## 2016-01-31 NOTE — Telephone Encounter (Signed)
Called and spoke with the pt and informed her of recent lab results and note.  Pt verbalized understanding.  Pt stated that she is on a ATB so she's not having any symptoms.  She stated that she will give Dr. Cruzita Lederer office a call to schedule an appt.  Pt stated that she will come by tomorrow to pick up the 24 hour urine container for protein, and while she's here she will schedule a lab appointment to recheck labs in 3 months.

## 2016-01-31 NOTE — Telephone Encounter (Addendum)
Notes Recorded by Ann Held, DO on 01/31/2016 at 8:17 AM Contaminated urine--- Repeat if still symptomatic   ----- Message from Ann Held, DO sent at 01/28/2016  9:27 PM EDT ----- Wbc elevated---  Pt is on abx-- recheck cbc when finished DM is not controlled--- she needs to f/u with endo We need 24 h urine for protein Recheck 3 months--- lipid, cmp, hgba1c

## 2016-02-01 NOTE — Addendum Note (Signed)
Addended by: Peggyann Shoals on: 02/01/2016 11:19 AM   Modules accepted: Orders

## 2016-02-11 DIAGNOSIS — R809 Proteinuria, unspecified: Secondary | ICD-10-CM | POA: Diagnosis not present

## 2016-02-14 LAB — PROTEIN, URINE, 24 HOUR
Protein, 24H Urine: 85 mg/24 h (ref <150)
Protein, Urine: 5 mg/dL (ref 5–24)

## 2016-03-23 ENCOUNTER — Other Ambulatory Visit: Payer: Self-pay

## 2016-03-23 MED ORDER — INSULIN PEN NEEDLE 32G X 4 MM MISC: 1 refills | Status: DC

## 2016-03-28 ENCOUNTER — Telehealth: Payer: Self-pay

## 2016-03-28 NOTE — Telephone Encounter (Signed)
No, I cannot do that it because I have not seen her recently. Since she was lost for follow-up, I assume that her diabetes care is now provided by PCP. I suggest that she contacts PCP for this.

## 2016-03-28 NOTE — Telephone Encounter (Signed)
patient requesting Donna Pham, patient has not been since since 07/2014. Patient has no appointments scheduled at this time and was notified on last refill to make one. Would you like to refill? Please advise. Thank you!

## 2016-03-29 ENCOUNTER — Other Ambulatory Visit: Payer: Self-pay | Admitting: Internal Medicine

## 2016-04-03 ENCOUNTER — Telehealth: Payer: Self-pay

## 2016-04-03 NOTE — Telephone Encounter (Signed)
Patient called and states that she is need of the tresiba, I advised patient that she has not been seen by you since 07/2014, and that she has to be seen to continue to get medications. patient states she understands, but her husband is the only one who drives and he broke his foot and cannot drive to appointments. Patient did make appointment in November with you, I asked patient if she had asked her PCP about refilling medication and they advised her to call us. Would you like to refill until her appointment in November; patient states she will try her very best to make it to the appointment as her health is important to her. Please advise. Thank you!

## 2016-04-04 ENCOUNTER — Other Ambulatory Visit: Payer: Self-pay

## 2016-04-04 MED ORDER — INSULIN DEGLUDEC 200 UNIT/ML ~~LOC~~ SOPN: 14.0000 [IU] | PEN_INJECTOR | Freq: Every day | SUBCUTANEOUS | 0 refills | Status: DC

## 2016-04-04 NOTE — Telephone Encounter (Signed)
OK to refill 3 mo's worth.

## 2016-04-04 NOTE — Telephone Encounter (Signed)
Called left voicemail and advised patient I had sent in [place] and which pharmacy I sent it too. Gave call back number if any issues, advised patient to keep November appointment.

## 2016-04-06 ENCOUNTER — Other Ambulatory Visit: Payer: Self-pay

## 2016-04-06 ENCOUNTER — Telehealth: Payer: Self-pay | Admitting: Internal Medicine

## 2016-04-06 MED ORDER — INSULIN GLARGINE 300 UNIT/ML ~~LOC~~ SOPN: 14.0000 [IU] | PEN_INJECTOR | Freq: Every day | SUBCUTANEOUS | 1 refills | Status: DC

## 2016-04-06 NOTE — Telephone Encounter (Signed)
Received the notification that the tresiba was not covered. I have placed the paper on Dr.Gherghe's desk and will advise of what med she wants to send in or if she wants to start a PA.

## 2016-04-06 NOTE — Telephone Encounter (Signed)
Sent in [place] into patient pharmacy as Tyler Aas was no longer covered by insurance and Toujeo was. Per Dr.Gherghe to keep same dosage.

## 2016-04-06 NOTE — Telephone Encounter (Signed)
Pt insurance is not covering the tresiba needs a PA insurance # [id number]

## 2016-04-25 ENCOUNTER — Other Ambulatory Visit: Payer: Self-pay

## 2016-04-25 ENCOUNTER — Telehealth: Payer: Self-pay

## 2016-04-25 MED ORDER — GLUCOSE BLOOD VI STRP: ORAL_STRIP | 5 refills | Status: DC

## 2016-04-25 NOTE — Telephone Encounter (Signed)
Patient called states that she is taking the toujeo right now as the tresiba was not covered by her insurance any longer. She is having high blood sugars the last one was 171 in the morning before eating or doing anything, also causing fast heart beat, and having diarrhea..Patinet states that the tresiba worked well for her. Do you want to try a PA? Please advise.

## 2016-04-25 NOTE — Telephone Encounter (Signed)
Yes, please let's start a PA for [place].

## 2016-04-28 ENCOUNTER — Telehealth: Payer: Self-pay | Admitting: Internal Medicine

## 2016-04-28 NOTE — Telephone Encounter (Signed)
Patient b/s is running high 175 morning. Please change medication back Insulin Degludec (TRESIBA FLEXTOUCH) 200 UNIT/ML SOPN   Send to [facility], [place] [phone] (Phone) [phone] (Fax)

## 2016-05-01 ENCOUNTER — Telehealth: Payer: Self-pay

## 2016-05-01 ENCOUNTER — Other Ambulatory Visit: Payer: Self-pay | Admitting: Family Medicine

## 2016-05-01 NOTE — Telephone Encounter (Signed)
Called patient to notify her of the increase of the toujeo, until sugars are below 130. We are still waiting for answer from insurance. Will go through covermymeds in hopes to speed the process up for patient to have [place].

## 2016-05-01 NOTE — Telephone Encounter (Signed)
Hi Almyra Free sending this to you not sure if I am supposed to change medication

## 2016-05-01 NOTE — Telephone Encounter (Signed)
Did the [place] PA go through?  Until we get an answer from her insurance, she needs to continue toujeo, but increase the dose by 3 units every 3 days until her sugars in the morning are less than 1:30. She also needs to schedule an appointment.

## 2016-05-02 ENCOUNTER — Telehealth: Payer: Self-pay

## 2016-05-02 NOTE — Telephone Encounter (Signed)
Patient is calling on the status of her medication Rudolpho Sevin she stated that Cruzita Lederer will have to call her insurance and request the Tresibia [phone].

## 2016-05-02 NOTE — Telephone Encounter (Signed)
She needs to continue toujeo, but increase the dose by 3 units every 3 days until her sugars in the morning are less than 130. She also needs to schedule an appointment.

## 2016-05-02 NOTE — Telephone Encounter (Signed)
Called patient, advised that she need to continue toujeo, as the tresiba PA was denied. Patient advised she would do the increase of 3 units every 3 days, and has an appointment that I notified her she needed to keep in December. Patient stated she would. No other questions at this time.

## 2016-05-03 ENCOUNTER — Other Ambulatory Visit: Payer: Self-pay | Admitting: Family Medicine

## 2016-05-03 ENCOUNTER — Encounter: Payer: Self-pay | Admitting: Family Medicine

## 2016-05-03 ENCOUNTER — Ambulatory Visit (INDEPENDENT_AMBULATORY_CARE_PROVIDER_SITE_OTHER): Payer: Medicare Other | Admitting: Family Medicine

## 2016-05-03 ENCOUNTER — Telehealth: Payer: Self-pay | Admitting: Internal Medicine

## 2016-05-03 VITALS — BP 146/74 | HR 76 | Temp 98.4°F | Ht 66.0 in | Wt 222.4 lb

## 2016-05-03 DIAGNOSIS — L237 Allergic contact dermatitis due to plants, except food: Secondary | ICD-10-CM

## 2016-05-03 MED ORDER — PREDNISONE 20 MG PO TABS: ORAL_TABLET | ORAL | 0 refills | Status: DC

## 2016-05-03 NOTE — Progress Notes (Signed)
Chief Complaint  Patient presents with  . Rash    Pt reports noticing rash z3 weeks and has been getting worse it started on the right hand and arm and has moved to the left hand and arms/Pt thinks it is a reaction to her new insulin /Pt has tried betamethasone cream with no relief/ Pt also reports Toujeo makes her blood sugars high in the morning     Subjective: Patient is a 60 y.o. female here for Medication issue.  3 weeks of an itchy rash, initial lesion started on R forearm. Changed from [place] to [place] on 10/1. She does have poison ivy in her back yard and was outside around this time. Has been using OTC steroid cream with some relief. No sick contacts, no new topicals. Denies swelling of tongue, lips or throat, no SOB  ROS: Lungs: Denies SOB  Family History  Problem Relation Age of Onset  . Arthritis Father   . Cancer Father   . Diabetes Mother    Past Medical History:  Diagnosis Date  . Arthritis   . CTS (carpal tunnel syndrome)    6/15-19/09 RLL PNA with sepsis high LFTs (ALT 61)  . Depression   . Diabetes mellitus   . GERD (gastroesophageal reflux disease)   . Hyperlipidemia   . Hypertension   . Right upper quadrant abdominal pain    No Known Allergies  Current Outpatient Prescriptions:  .  ACCU-CHEK FASTCLIX LANCETS MISC, Check blood sugar twice daily, Disp: 200 each, Rfl: 5 .  Alcohol Swabs (B-D SINGLE USE SWABS REGULAR) PADS, Check blood sugar twice daily, Disp: 200 each, Rfl: 3 .  aspirin 81 MG tablet, Take 81 mg by mouth daily.  , Disp: , Rfl:  .  betamethasone dipropionate (DIPROLENE) 0.05 % cream, Apply topically 2 (two) times daily., Disp: 30 g, Rfl: 0 .  Blood Glucose Monitoring Suppl (ACCU-CHEK NANO SMARTVIEW) W/DEVICE KIT, Check blood sugar as directed, Disp: 1 kit, Rfl: 0 .  esomeprazole (NEXIUM) 40 MG capsule, TAKE 1 CAPSULE BY MOUTH EVERY DAY BEFORE BREAKFAST, Disp: 90 capsule, Rfl: 1 .  glipiZIDE (GLUCOTROL XL) 10 MG 24 hr tablet, take 1 tablet by  mouth once daily, Disp: 90 tablet, Rfl: 3 .  glucose blood (ACCU-CHEK SMARTVIEW) test strip, CHECK BLOOD SUGAR TWICE DAILY, Disp: 200 each, Rfl: 5 .  Insulin Glargine (TOUJEO SOLOSTAR) 300 UNIT/ML SOPN, Inject 14 Units into the skin at bedtime., Disp: 3 pen, Rfl: 1 .  Insulin Pen Needle (BD PEN NEEDLE NANO U/F) 32G X 4 MM MISC, USE AS DIRECTED ONCE DAILY, Disp: 100 each, Rfl: 1 .  metFORMIN (GLUCOPHAGE-XR) 500 MG 24 hr tablet, TAKE 4 TABLETS DAILY WITH SUPPER, Disp: 360 tablet, Rfl: 3 .  ONGLYZA 5 MG TABS tablet, TAKE 1 TABLET BY MOUTH  DAILY, Disp: 90 tablet, Rfl: 1 .  rosuvastatin (CRESTOR) 20 MG tablet, TAKE 1 AND 1/2 TABLETS BY MOUTH DAILY, Disp: 135 tablet, Rfl: 1 .  sertraline (ZOLOFT) 100 MG tablet, Take 2 tablets (200 mg total) by mouth daily., Disp: 180 tablet, Rfl: 1 .  fenofibrate 160 MG tablet, Take 1 tablet (160 mg total) by mouth daily. (Patient not taking: Reported on 05/03/2016), Disp: 90 tablet, Rfl: 1 .  Insulin Degludec (TRESIBA FLEXTOUCH) 200 UNIT/ML SOPN, Inject 14 Units into the skin at bedtime. Needs to keep appointment for further refills. (Patient not taking: Reported on 05/03/2016), Disp: 9 pen, Rfl: 0  Objective: BP (!) 146/74 (BP Location: Left Arm, Patient Position: Sitting,  Cuff Size: Large)   Pulse 76   Temp 98.4 F (36.9 C) (Oral)   Ht 5' 6"  (1.676 m)   Wt 222 lb 6.4 oz (100.9 kg)   SpO2 98%   BMI 35.90 kg/m  General: Awake, appears stated age HEENT: MMM, EOMi Heart: RRR, no murmurs Lungs: CTAB, no rales, wheezes or rhonchi. Normal effort Skin: vesicles in linear fashion over R arm, and b/l forearms, some on the dorsal aspects of her prox phalanx on the R, no interweb involvement. No surrounding erythema or drainage. Psych: Age appropriate judgment and insight, normal affect and mood  Assessment and Plan: Poison ivy dermatitis - Plan: DISCONTINUED: predniSONE (DELTASONE) 20 MG tablet  Orders as above. Does not resemble a drug eruption compared to poison  ivy exposure. Particularly with lesions showing up prior to the insulin change. OK with BP given age. F/u with Dr. Carollee Herter as originally scheduled. The patient voiced understanding and agreement to the plan.  [place], DO 05/03/16  9:57 AM

## 2016-05-03 NOTE — Progress Notes (Signed)
Pre visit review using our clinic review tool, if applicable. No additional management support is needed unless otherwise documented below in the visit note. 

## 2016-05-03 NOTE — Telephone Encounter (Signed)
Pt called in and said that the insurance company told her that we need to appeal the denial for the [place].  Please advise.

## 2016-05-04 NOTE — Telephone Encounter (Signed)
OK to try  - can write that Toujeo is ineffective + Pt tried [place] in the past and got better control.

## 2016-05-05 ENCOUNTER — Telehealth: Payer: Self-pay

## 2016-05-05 NOTE — Telephone Encounter (Signed)
Please ask her to take half of a 10 mg of glipizide extended-release before lunch and half before dinner. Let us know tomorrow about the sugars.

## 2016-05-05 NOTE — Telephone Encounter (Signed)
Called and advised to take half the tablet of glipizide before lunch and before dinner. Patient understood. No other questions.

## 2016-05-08 ENCOUNTER — Ambulatory Visit: Payer: Medicare Other | Admitting: Family Medicine

## 2016-05-11 NOTE — Telephone Encounter (Signed)
Sent Dr.Gherghe letter that I typed up for the appeal for patient medication for Tresiba.

## 2016-05-15 NOTE — Telephone Encounter (Signed)
Davina with [employer] appeal dept,stated appeal has been approved.   [id number]

## 2016-05-16 ENCOUNTER — Other Ambulatory Visit: Payer: Self-pay

## 2016-05-16 MED ORDER — INSULIN DEGLUDEC 200 UNIT/ML ~~LOC~~ SOPN: 14.0000 [IU] | PEN_INJECTOR | Freq: Every day | SUBCUTANEOUS | 0 refills | Status: DC

## 2016-05-16 NOTE — Telephone Encounter (Signed)
Medication sent into patient pharmacy.

## 2016-05-16 NOTE — Telephone Encounter (Signed)
Yes! Doristine Devoid!

## 2016-05-23 ENCOUNTER — Ambulatory Visit: Payer: Medicare Other | Admitting: Internal Medicine

## 2016-06-19 ENCOUNTER — Ambulatory Visit (INDEPENDENT_AMBULATORY_CARE_PROVIDER_SITE_OTHER): Payer: Medicare Other | Admitting: Internal Medicine

## 2016-06-19 ENCOUNTER — Encounter: Payer: Self-pay | Admitting: Internal Medicine

## 2016-06-19 VITALS — BP 122/82 | HR 85 | Wt 217.0 lb

## 2016-06-19 DIAGNOSIS — E1165 Type 2 diabetes mellitus with hyperglycemia: Secondary | ICD-10-CM

## 2016-06-19 DIAGNOSIS — IMO0001 Reserved for inherently not codable concepts without codable children: Secondary | ICD-10-CM

## 2016-06-19 LAB — POCT GLYCOSYLATED HEMOGLOBIN (HGB A1C): Hemoglobin A1C: 7.4

## 2016-06-19 MED ORDER — GLUCOSE BLOOD VI STRP: ORAL_STRIP | 5 refills | Status: DC

## 2016-06-19 MED ORDER — INSULIN DEGLUDEC 200 UNIT/ML ~~LOC~~ SOPN: 14.0000 [IU] | PEN_INJECTOR | Freq: Every day | SUBCUTANEOUS | 2 refills | Status: DC

## 2016-06-19 NOTE — Progress Notes (Signed)
Patient ID: Donna Pham, female   DOB: Jan 19, 1956, 60 y.o.   MRN: [id number]  HPI: Donna Pham is a 60 y.o.-year-old [place] female, returning for f/u for DM2, dx 2000, insulin-dependent, uncontrolled, without complications.   Last visit was 2 years ago!  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  Last hemoglobin A1c was: Lab Results  Component Value Date   HGBA1C 8.5 (H) 01/28/2016   HGBA1C 7.6 (H) 07/23/2015   HGBA1C 7.4 (H) 01/08/2015   Pt is on a regimen of: - Metformin XR 1000 mg 2x a day - Glipizide XL 10 mg - started 10/21/2013 - Onglyza 5 mg daily - Tresiba U200 14 units - started 07/2014 >> Toujeo was ineffective >> back on Tresiba. Invokana 100 mg >> had 3 yeast infections since last visit >> we stopped it. She also stopped Cycloset 3 weeks ago (did not get it from pharmacy). She tried Avandia in the past.  She tried Actos in the past.   Pt checks her sugars 2-3x a day: - am: 120-170 >> 113-172 (140-160) >> 64, 74 (after taking dog for a walk), 140-180 >> 69, 82, 91-145, 157 - 2h after b'fast: n/c >> 124-270 (140-190) >> 160-220 >> n/c - before lunch: n/c >> 194, 303 >> 160-170 >> n/c - 2h after lunch: 170-240 >> 156-250 >> 180 >> 146, 179 - before dinner: n/c >> 164, 178, 220 >> 160-250 >> 117, 163 - 2h after dinner: (139)160-260 (299) >> 160-254 >> 160  >> 66, 101, 160-181, 196, 200, 236 - nighttime: n/c >> 124-230 >> n/c >> 117  Meter: AccuChek Smart View  Pt's meals are: - Breakfast: coffee + waffle + PB; blueberry - Lunch: chicken noodle soup, stews - Dinner: fried chicken - Snack: 1  - no CKD, last BUN/creatinine:  Lab Results  Component Value Date   BUN 18 01/28/2016   CREATININE 0.81 01/28/2016  24 h urine protein 85 (<150) - last set of lipids: Lab Results  Component Value Date   CHOL 142 01/28/2016   HDL 45.10 01/28/2016   LDLCALC 71 01/28/2016   LDLDIRECT 167.2 08/22/2010   TRIG 129.0 01/28/2016   CHOLHDL 3 01/28/2016  On Crestor and fenofibrate. - last eye exam was on 01/28/2016. No DR.  - no numbness and tingling in her feet.  She also has a history of HTN, HL, h/o cholecystitis, fatty liver.  I reviewed pt's medications, allergies, PMH, social hx, family hx, and changes were documented in the history of present illness. Otherwise, unchanged from my initial visit note.  ROS: Constitutional: no weight gain/loss, no fatigue, no subjective hyperthermia/hypothermia Eyes: no blurry vision, no xerophthalmia ENT: no sore throat, no nodules palpated in throat, no dysphagia/odynophagia, no hoarseness Cardiovascular: no CP/SOB/palpitations/leg swelling Respiratory: no cough/SOB Gastrointestinal: no N/V/D/C/heartburn Musculoskeletal: no muscle/+ joint aches Skin: no rashes Neurological: no tremors/numbness/tingling/dizziness  PE: BP 122/82   Pulse 85   Wt 217 lb (98.4 kg)   BMI 35.02 kg/m  Wt Readings from Last 3 Encounters:  06/19/16 217 lb (98.4 kg)  05/03/16 222 lb 6.4 oz (100.9 kg)  01/28/16 219 lb 3.2 oz (99.4 kg)   Constitutional: obese, in NAD Eyes: PERRLA, EOMI, no exophthalmos ENT: moist mucous membranes, no thyromegaly, no cervical lymphadenopathy Cardiovascular: RRR, No MRG Respiratory: CTA B Gastrointestinal: abdomen soft, NT, ND, BS+ Musculoskeletal: no deformities, strength intact in all 4 Skin: moist, warm, no rashes Neurological: no tremor with outstretched hands, DTR normal in  all 4  ASSESSMENT: 1. DM2, insulin-dependent, uncontrolled, without complications  PLAN:  1. Patient with long-standing, uncontrolled diabetes, on oral antidiabetic regimen + basal insulin added at last visit Donna Pham U200). Sugars are better, but they fluctuate, being higher with dietary indiscretions. Discussed about reducing fatty foods and sweets. No need to change her regimen. - I advised her to: Patient Instructions  Please continue: - Metformin XR 1000 mg 2x a day -  Glipizide XL 10 mg in am - Onglyza 5 mg daily - Tresiba U200 insulin 14 units at bedtime  Please return in 3 months with your sugar log.   - continue checking sugars at different times of the day - check 2 times a day, rotating checks - up to date with eye exams - HbA1c today is better: 7.4% - Return to clinic in 1 mo with sugar log   Philemon Kingdom, MD PhD [facility] Endocrinology

## 2016-06-19 NOTE — Patient Instructions (Addendum)
Please continue: - Metformin XR 1000 mg 2x a day - Glipizide XL 10 mg in am - Onglyza 5 mg daily - Tresiba U200 insulin 14 units at bedtime.   Please return in 3 months with your sugar log.

## 2016-07-12 ENCOUNTER — Ambulatory Visit (HOSPITAL_COMMUNITY)
Admission: EM | Admit: 2016-07-12 | Discharge: 2016-07-12 | Disposition: A | Discharge status: Home / Self Care | Payer: Medicare Other | Attending: Family Medicine | Admitting: Family Medicine

## 2016-07-12 ENCOUNTER — Ambulatory Visit (INDEPENDENT_AMBULATORY_CARE_PROVIDER_SITE_OTHER): Payer: Medicare Other

## 2016-07-12 ENCOUNTER — Encounter (HOSPITAL_COMMUNITY): Payer: Self-pay | Admitting: Emergency Medicine

## 2016-07-12 DIAGNOSIS — R05 Cough: Secondary | ICD-10-CM | POA: Diagnosis not present

## 2016-07-12 DIAGNOSIS — J069 Acute upper respiratory infection, unspecified: Secondary | ICD-10-CM

## 2016-07-12 MED ORDER — HYDROCOD POLST-CPM POLST ER 10-8 MG/5ML PO SUER: 5.0000 mL | Freq: Two times a day (BID) | ORAL | 0 refills | Status: DC | PRN

## 2016-07-12 NOTE — ED Provider Notes (Signed)
[place]    CSN: [id number] Arrival date & time: 07/12/16  1043     History   Chief Complaint Chief Complaint  Patient presents with  . URI    HPI Donna Pham is a 60 y.o. female.   The history is provided by the patient.  URI  Presenting symptoms: congestion, cough and rhinorrhea   Presenting symptoms: no fever   Severity:  Moderate Onset quality:  Gradual Duration:  10 days Chronicity:  New Relieved by:  None tried Worsened by:  Nothing Ineffective treatments:  None tried Associated symptoms: no wheezing   Risk factors: recent illness     Past Medical History:  Diagnosis Date  . Arthritis   . CTS (carpal tunnel syndrome)    6/15-19/09 RLL PNA with sepsis high LFTs (ALT 61)  . Depression   . Diabetes mellitus   . GERD (gastroesophageal reflux disease)   . Hyperlipidemia   . Hypertension   . Right upper quadrant abdominal pain     Patient Active Problem List   Diagnosis Date Noted  . Swelling of upper lip 05/26/2014  . Obesity (BMI 30-39.9) 08/28/2013  . Breast pain, left 04/28/2013  . Streptococcal sore throat 02/12/2013  . Biliary dyskinesia 05/19/2011  . Cholecystitis 12/29/2010  . ANXIETY STATE, UNSPECIFIED 05/10/2010  . Acute sinusitis 04/11/2010  . UTI 03/24/2010  . FATTY LIVER DISEASE 01/31/2010  . HEADACHE, OCCIPITAL 01/31/2010  . LIMB PAIN 10/28/2009  . FATIGUE / MALAISE 10/28/2009  . ABDOMINAL PAIN 07/05/2009  . PLANTAR WART, LEFT 06/02/2009  . MOLE 06/02/2009  . Backache 06/02/2009  . CHEST PAIN UNSPECIFIED 05/14/2009  . VAGINITIS, CANDIDAL 04/20/2009  . CELLULITIS/ABSCESS, ARM 04/09/2009  . ACUTE BRONCHITIS 02/03/2009  . PLANTAR FASCIITIS, LEFT 03/03/2008  . CANDIDIASIS OF UNSPECIFIED SITE 01/10/2008  . PNEUMONIA, RIGHT LOWER LOBE 01/10/2008  . UNS ADVRS EFF UNS RX MEDICINAL&BIOLOGICAL SBSTNC 01/10/2008  . CONTACT DERMATITIS&OTH ECZEMA DUE OTH [place] AGENT 08/08/2007  . Hyperlipidemia LDL goal <70 05/20/2007  .  CARPAL TUNNEL SYNDROME, BILATERAL, HX OF 03/12/2007  . Diabetes mellitus type 2, uncontrolled, without complications ([place]) 73/22/0254  . DEPRESSION 01/22/2007  . HYPERTENSION 01/22/2007  . GERD 01/22/2007  . SHOULDER STRAIN 01/22/2007    Past Surgical History:  Procedure Laterality Date  . EYE SURGERY      OB History    No data available       Home Medications    Prior to Admission medications   Medication Sig Start Date End Date Taking? Authorizing Provider  ACCU-CHEK FASTCLIX LANCETS MISC Check blood sugar twice daily 11/04/15   Rosalita Chessman Chase, DO  Alcohol Swabs (B-D SINGLE USE SWABS REGULAR) PADS Check blood sugar twice daily 08/10/14   Rosalita Chessman Chase, DO  aspirin 81 MG tablet Take 81 mg by mouth daily.      Historical Provider, MD  betamethasone dipropionate (DIPROLENE) 0.05 % cream Apply topically 2 (two) times daily. 01/17/16   Debbrah Alar, NP  Blood Glucose Monitoring Suppl (ACCU-CHEK NANO SMARTVIEW) W/DEVICE KIT Check blood sugar as directed 08/10/14   Rosalita Chessman Chase, DO  esomeprazole (NEXIUM) 40 MG capsule TAKE 1 CAPSULE BY MOUTH EVERY DAY BEFORE BREAKFAST 01/19/16   Alferd Apa Lowne Chase, DO  fenofibrate 160 MG tablet Take 1 tablet (160 mg total) by mouth daily. 08/04/14   Rosalita Chessman Chase, DO  glipiZIDE (GLUCOTROL XL) 10 MG 24 hr tablet take 1 tablet by mouth once daily 12/09/15   Alferd Apa  Lowne Chase, DO  glucose blood (ACCU-CHEK SMARTVIEW) test strip CHECK BLOOD SUGAR 4x DAILY 06/19/16   Carlus Pavlov, MD  Insulin Degludec (TRESIBA FLEXTOUCH) 200 UNIT/ML SOPN Inject 14 Units into the skin at bedtime. 06/19/16   Carlus Pavlov, MD  Insulin Pen Needle (BD PEN NEEDLE NANO U/F) 32G X 4 MM MISC USE AS DIRECTED ONCE DAILY 03/23/16   Carlus Pavlov, MD  metFORMIN (GLUCOPHAGE-XR) 500 MG 24 hr tablet TAKE 4 TABLETS DAILY WITH SUPPER 11/03/15   Yvonne R Lowne Chase, DO  ONGLYZA 5 MG TABS tablet TAKE 1 TABLET BY MOUTH  DAILY 05/02/16   Grayling Congress Lowne Chase,  DO  predniSONE (DELTASONE) 20 MG tablet 3 tabs daily for 1 week then 2 tabs daily for 1 week and then 1 tab daily. 05/03/16   Sharlene Dory, DO  rosuvastatin (CRESTOR) 20 MG tablet TAKE 1 AND 1/2 TABLETS BY MOUTH DAILY 07/28/15   Lelon Perla Chase, DO  sertraline (ZOLOFT) 100 MG tablet Take 2 tablets (200 mg total) by mouth daily. 08/04/14   Donato Schultz, DO    Family History Family History  Problem Relation Age of Onset  . Arthritis Father   . Cancer Father   . Diabetes Mother     Social History Social History  Substance Use Topics  . Smoking status: Never Smoker  . Smokeless tobacco: Never Used  . Alcohol use No     Allergies   Patient has no known allergies.   Review of Systems Review of Systems  Constitutional: Negative.  Negative for fever.  HENT: Positive for congestion, postnasal drip and rhinorrhea.   Respiratory: Positive for cough. Negative for shortness of breath and wheezing.   Cardiovascular: Negative.   All other systems reviewed and are negative.    Physical Exam Triage Vital Signs ED Triage Vitals  Enc Vitals Group     BP 07/12/16 1155 133/66     Pulse Rate 07/12/16 1155 80     Resp 07/12/16 1155 16     Temp 07/12/16 1155 98.4 F (36.9 C)     Temp Source 07/12/16 1155 Oral     SpO2 07/12/16 1155 98 %     Weight 07/12/16 1154 220 lb (99.8 kg)     Height 07/12/16 1154 5\' 6"  (1.676 m)     Head Circumference --      Peak Flow --      Pain Score 07/12/16 1155 0     Pain Loc --      Pain Edu? --      Excl. in GC? --    No data found.   Updated Vital Signs BP 133/66   Pulse 80   Temp 98.4 F (36.9 C) (Oral)   Resp 16   Ht 5\' 6"  (1.676 m)   Wt 220 lb (99.8 kg)   SpO2 98%   BMI 35.51 kg/m   Visual Acuity Right Eye Distance:   Left Eye Distance:   Bilateral Distance:    Right Eye Near:   Left Eye Near:    Bilateral Near:     Physical Exam  Constitutional: She is oriented to person, place, and time. She appears  well-developed and well-nourished. No distress.  HENT:  Head: Normocephalic.  Right Ear: External ear normal.  Left Ear: External ear normal.  Mouth/Throat: Oropharynx is clear and moist.  Eyes: Pupils are equal, round, and reactive to light.  Neck: Normal range of motion. Neck supple.  Cardiovascular: Normal rate  and regular rhythm.   Pulmonary/Chest: Effort normal and breath sounds normal. She has no rales.  Abdominal: Soft. Bowel sounds are normal.  Lymphadenopathy:    She has no cervical adenopathy.  Neurological: She is alert and oriented to person, place, and time.  Skin: Skin is warm and dry.  Nursing note and vitals reviewed.    UC Treatments / Results  Labs (all labs ordered are listed, but only abnormal results are displayed) Labs Reviewed - No data to display  EKG  EKG Interpretation None       Radiology No results found. X-rays reviewed and report per radiologist.  Procedures Procedures (including critical care time)  Medications Ordered in UC Medications - No data to display   Initial Impression / Assessment and Plan / UC Course  I have reviewed the triage vital signs and the nursing notes.  Pertinent labs & imaging results that were available during my care of the patient were reviewed by me and considered in my medical decision making (see chart for details).  Clinical Course       Final Clinical Impressions(s) / UC Diagnoses   Final diagnoses:  None    New Prescriptions New Prescriptions   No medications on file     Billy Fischer, MD 07/25/16 1015

## 2016-07-12 NOTE — ED Triage Notes (Signed)
PT reports productive cough and nasal congestion for 10 days

## 2016-07-21 ENCOUNTER — Other Ambulatory Visit: Payer: Self-pay | Admitting: Family Medicine

## 2016-07-26 ENCOUNTER — Other Ambulatory Visit: Payer: Self-pay | Admitting: Family Medicine

## 2016-08-07 ENCOUNTER — Other Ambulatory Visit: Payer: Self-pay

## 2016-08-07 DIAGNOSIS — F411 Generalized anxiety disorder: Secondary | ICD-10-CM

## 2016-08-07 MED ORDER — SERTRALINE HCL 100 MG PO TABS: 200.0000 mg | ORAL_TABLET | Freq: Every day | ORAL | 1 refills | Status: AC

## 2016-08-07 NOTE — Telephone Encounter (Signed)
Last seen 05/03/16  Last filled 08/04/14  #[id number]  Please advise  PC

## 2016-08-14 ENCOUNTER — Other Ambulatory Visit: Payer: Self-pay

## 2016-08-14 ENCOUNTER — Telehealth: Payer: Self-pay | Admitting: Internal Medicine

## 2016-08-14 MED ORDER — ACCU-CHEK NANO SMARTVIEW W/DEVICE KIT: PACK | 0 refills | Status: AC

## 2016-08-14 NOTE — Telephone Encounter (Signed)
Pt uses [facility] on [address] and [address] please call in a rx for the accucheck nano meter, hers has been broken for 5 days

## 2016-08-14 NOTE — Telephone Encounter (Signed)
Device submitted.

## 2016-08-21 DIAGNOSIS — E785 Hyperlipidemia, unspecified: Secondary | ICD-10-CM | POA: Diagnosis not present

## 2016-08-21 DIAGNOSIS — Z794 Long term (current) use of insulin: Secondary | ICD-10-CM | POA: Diagnosis not present

## 2016-08-21 DIAGNOSIS — E1121 Type 2 diabetes mellitus with diabetic nephropathy: Secondary | ICD-10-CM | POA: Diagnosis not present

## 2016-08-21 DIAGNOSIS — Z0000 Encounter for general adult medical examination without abnormal findings: Secondary | ICD-10-CM | POA: Diagnosis not present

## 2016-08-31 ENCOUNTER — Telehealth: Payer: Self-pay | Admitting: Internal Medicine

## 2016-08-31 NOTE — Telephone Encounter (Signed)
[employer] called and was following up on the Omega3 and Lidocaine ointment scripts. CB# [phone]

## 2016-08-31 NOTE — Telephone Encounter (Signed)
[employer], spoke with a rep; advised that we do not fill these types of medications. They need to go to patient PCP.

## 2016-09-18 ENCOUNTER — Ambulatory Visit: Payer: Medicare Other | Admitting: Internal Medicine

## 2016-09-30 ENCOUNTER — Other Ambulatory Visit: Payer: Self-pay | Admitting: Internal Medicine

## 2016-10-01 NOTE — Progress Notes (Signed)
Cardiology Office Note    Date:  10/02/2016   ID:  Donna, Pham [birth year], MRN [id number]  PCP:  Ann Held, DO  Cardiologist:  Peter Martinique, MD    History of Present Illness:  Donna Pham is a 61 y.o. female is seen at the request of Dr. Carollee Herter for evaluation of atypical chest pain. She has a history of DM on insulin, HTN, and HLD. She has been seen in the distant past by Dr. Haroldine Laws. She reported history of cardiac cath in [facility] in 2001 which was normal. Myoview studies in 2007 and again in 2010 were normal. She is originally from [place]. She reports symptoms of pain in her left back between her spine and shoulder blade. It may radiate around her chest into her left breast. It is sharp. She does have some associated left arm numbness and pain. Pain is more noticeable at night and not associated with activity. Relief with Aleve or Tylenol. She is very concerned about her cardiac risk. She does report she is under a lot of stress. Her husband is 74 years older and in poor health. She states she has no time to exercise.    Past Medical History:  Diagnosis Date  . Arthritis   . CTS (carpal tunnel syndrome)    6/15-19/09 RLL PNA with sepsis high LFTs (ALT 61)  . Depression   . Diabetes mellitus   . GERD (gastroesophageal reflux disease)   . Hyperlipidemia   . Hypertension   . Right upper quadrant abdominal pain     Past Surgical History:  Procedure Laterality Date  . EYE SURGERY      Current Medications: Outpatient Medications Prior to Visit  Medication Sig Dispense Refill  . ACCU-CHEK FASTCLIX LANCETS MISC Check blood sugar twice daily 200 each 5  . Alcohol Swabs (B-D SINGLE USE SWABS REGULAR) PADS Check blood sugar twice daily 200 each 3  . aspirin 81 MG tablet Take 81 mg by mouth daily.      . betamethasone dipropionate (DIPROLENE) 0.05 % cream Apply topically 2 (two) times daily. 30 g 0  . Blood Glucose Monitoring Suppl (ACCU-CHEK NANO  SMARTVIEW) w/Device KIT Check blood sugar as directed 1 kit 0  . chlorpheniramine-HYDROcodone (TUSSIONEX PENNKINETIC ER) 10-8 MG/5ML SUER Take 5 mLs by mouth every 12 (twelve) hours as needed for cough. 140 mL 0  . esomeprazole (NEXIUM) 40 MG capsule TAKE 1 CAPSULE BY MOUTH EVERY DAY BEFORE BREAKFAST 90 capsule 1  . fenofibrate 160 MG tablet Take 1 tablet (160 mg total) by mouth daily. 90 tablet 1  . glipiZIDE (GLUCOTROL XL) 10 MG 24 hr tablet take 1 tablet by mouth once daily 90 tablet 3  . glucose blood (ACCU-CHEK SMARTVIEW) test strip CHECK BLOOD SUGAR 4x DAILY 300 each 5  . Insulin Degludec (TRESIBA FLEXTOUCH) 200 UNIT/ML SOPN Inject 14 Units into the skin at bedtime. 9 pen 2  . Insulin Pen Needle (BD PEN NEEDLE NANO U/F) 32G X 4 MM MISC USE AS DIRECTED ONCE DAILY 100 each 1  . metFORMIN (GLUCOPHAGE-XR) 500 MG 24 hr tablet TAKE 4 TABLETS DAILY WITH  SUPPER 360 tablet 0  . ONGLYZA 5 MG TABS tablet TAKE 1 TABLET BY MOUTH  DAILY 90 tablet 1  . rosuvastatin (CRESTOR) 20 MG tablet TAKE 1 AND 1/2 TABLETS BY MOUTH DAILY 135 tablet 0  . sertraline (ZOLOFT) 100 MG tablet Take 2 tablets (200 mg total) by mouth daily. 180 tablet 1  . predniSONE (  DELTASONE) 20 MG tablet 3 tabs daily for 1 week then 2 tabs daily for 1 week and then 1 tab daily. 42 tablet 0  . rosuvastatin (CRESTOR) 20 MG tablet TAKE 1 AND 1/2 TABLETS BY MOUTH DAILY 135 tablet 1   No facility-administered medications prior to visit.      Allergies:   Patient has no known allergies.   Social History   Social History  . Marital status: Married    Spouse name: N/A  . Number of children: 2  . Years of education: N/A   Occupational History  . Retired    Social History Main Topics  . Smoking status: Never Smoker  . Smokeless tobacco: Never Used  . Alcohol use No  . Drug use: No  . Sexual activity: Not Asked   Other Topics Concern  . None   Social History Narrative   No diet    Regular exercise-no   Originally from [place]       Family History:  The patient's family history includes Arthritis in her father; Cancer in her father; Diabetes in her mother; Heart disease in her mother.   ROS:   Please see the history of present illness.    ROS All other systems reviewed and are negative.   PHYSICAL EXAM:   VS:  BP 110/72   Pulse 82   Ht 5' 5"  (1.651 m)   Wt 218 lb 12.8 oz (99.2 kg)   BMI 36.41 kg/m    GEN: Well nourished, obese,  in no acute distress  HEENT: normal  Neck: no JVD, carotid bruits, or masses Cardiac: RRR; no murmurs, rubs, or gallops,no edema  Respiratory:  clear to auscultation bilaterally, normal work of breathing GI: soft, nontender, nondistended, + BS MS: no deformity or atrophy  Skin: warm and dry, no rash Neuro:  Alert and Oriented x 3, Strength and sensation are intact Psych: euthymic mood, full affect  Wt Readings from Last 3 Encounters:  10/02/16 218 lb 12.8 oz (99.2 kg)  07/12/16 220 lb (99.8 kg)  06/19/16 217 lb (98.4 kg)      Studies/Labs Reviewed:   EKG:  EKG is ordered today.  The ekg ordered today demonstrates NSR rate 77, low voltage and poor R wave progression. I have personally reviewed and interpreted this study.   Recent Labs: 01/28/2016: ALT 11; BUN 18; Creatinine, Ser 0.81; Hemoglobin 14.1; Platelets 251.0; Potassium 4.0; Sodium 139; TSH 1.09   Lipid Panel    Component Value Date/Time   CHOL 142 01/28/2016 1106   TRIG 129.0 01/28/2016 1106   HDL 45.10 01/28/2016 1106   CHOLHDL 3 01/28/2016 1106   VLDL 25.8 01/28/2016 1106   LDLCALC 71 01/28/2016 1106   LDLDIRECT 167.2 08/22/2010 1112    Additional studies/ records that were reviewed today include:  CHEST  2 VIEW  COMPARISON:  August 16, 2012  FINDINGS: There is mild scarring in the left base. Lungs elsewhere are clear. Heart size and pulmonary vascularity are normal. No adenopathy. There is calcification in the right carotid artery.  IMPRESSION: Mild scarring left base. No edema or  consolidation. Carotid artery calcification noted.   Electronically Signed   By: Lowella Grip III M.D.   On: 07/12/2016 12:35  ASSESSMENT:    1. Atypical chest pain   2. Hyperlipidemia LDL goal <70   3. Essential hypertension   4. Type 2 diabetes mellitus with complication, with long-term current use of insulin (HCC)      PLAN:  In  order of problems listed above:  1. Her pain is quite atypical and more suggestive of musculoskeletal pain. She does have multiple cardiac risk factors and it has been 8 yrs since her last cardiac evaluation. We discussed further evaluation with a nuclear stress test vs. Cardiac CTA. Will proceed with a nuclear stress test.  2. Well controlled on Crestor 3. Well controlled currently on no therapy 4. Per primary care.    Medication Adjustments/Labs and Tests Ordered: Current medicines are reviewed at length with the patient today.  Concerns regarding medicines are outlined above.  Medication changes, Labs and Tests ordered today are listed in the Patient Instructions below. Patient Instructions  We will schedule you for a nuclear stress test  Continue your current therapy      Signed, Peter Martinique, MD  10/02/2016 1:32 PM    [facility] [address], [place], [place], [address] [phone]

## 2016-10-02 ENCOUNTER — Ambulatory Visit (INDEPENDENT_AMBULATORY_CARE_PROVIDER_SITE_OTHER): Payer: Medicare Other | Admitting: Cardiology

## 2016-10-02 ENCOUNTER — Encounter: Payer: Self-pay | Admitting: Cardiology

## 2016-10-02 VITALS — BP 110/72 | HR 82 | Ht 65.0 in | Wt 218.8 lb

## 2016-10-02 DIAGNOSIS — I10 Essential (primary) hypertension: Secondary | ICD-10-CM | POA: Diagnosis not present

## 2016-10-02 DIAGNOSIS — Z794 Long term (current) use of insulin: Secondary | ICD-10-CM

## 2016-10-02 DIAGNOSIS — E118 Type 2 diabetes mellitus with unspecified complications: Secondary | ICD-10-CM

## 2016-10-02 DIAGNOSIS — R0789 Other chest pain: Secondary | ICD-10-CM

## 2016-10-02 DIAGNOSIS — E785 Hyperlipidemia, unspecified: Secondary | ICD-10-CM

## 2016-10-02 NOTE — Patient Instructions (Signed)
We will schedule you for a nuclear stress test  Continue your current therapy   

## 2016-10-10 ENCOUNTER — Telehealth (HOSPITAL_COMMUNITY): Payer: Self-pay

## 2016-10-10 NOTE — Telephone Encounter (Signed)
Encounter complete. 

## 2016-10-12 ENCOUNTER — Ambulatory Visit (HOSPITAL_COMMUNITY)
Admission: RE | Admit: 2016-10-12 | Discharge: 2016-10-12 | Disposition: A | Discharge status: Home / Self Care | Payer: Medicare Other | Source: Ambulatory Visit | Attending: Cardiovascular Disease | Admitting: Cardiovascular Disease

## 2016-10-12 DIAGNOSIS — E785 Hyperlipidemia, unspecified: Secondary | ICD-10-CM | POA: Insufficient documentation

## 2016-10-12 DIAGNOSIS — E118 Type 2 diabetes mellitus with unspecified complications: Secondary | ICD-10-CM | POA: Diagnosis not present

## 2016-10-12 DIAGNOSIS — I10 Essential (primary) hypertension: Secondary | ICD-10-CM | POA: Diagnosis not present

## 2016-10-12 DIAGNOSIS — Z794 Long term (current) use of insulin: Secondary | ICD-10-CM | POA: Diagnosis not present

## 2016-10-12 DIAGNOSIS — R0789 Other chest pain: Secondary | ICD-10-CM | POA: Insufficient documentation

## 2016-10-12 DIAGNOSIS — E669 Obesity, unspecified: Secondary | ICD-10-CM | POA: Diagnosis not present

## 2016-10-12 DIAGNOSIS — I2510 Atherosclerotic heart disease of native coronary artery without angina pectoris: Secondary | ICD-10-CM | POA: Insufficient documentation

## 2016-10-12 DIAGNOSIS — Z8249 Family history of ischemic heart disease and other diseases of the circulatory system: Secondary | ICD-10-CM | POA: Diagnosis not present

## 2016-10-12 LAB — MYOCARDIAL PERFUSION IMAGING
Estimated workload: 7 METS
Exercise duration (min): 6 min
Exercise duration (sec): 0 s
LV dias vol: 74 mL (ref 46–106)
LV sys vol: 28 mL
MPHR: 158 {beats}/min
Peak HR: 155 {beats}/min
Percent HR: 98 %
RPE: 17
Rest HR: 73 {beats}/min
SDS: 2
SRS: 0
SSS: 2
TID: 1.23

## 2016-10-12 MED ORDER — TECHNETIUM TC 99M TETROFOSMIN IV KIT
32.3000 | PACK | Freq: Once | INTRAVENOUS | Status: AC | PRN
  Administered 2016-10-12: 32.3 via INTRAVENOUS
  Filled 2016-10-12: qty 33

## 2016-10-12 MED ORDER — TECHNETIUM TC 99M TETROFOSMIN IV KIT
10.5000 | PACK | Freq: Once | INTRAVENOUS | Status: AC | PRN
  Administered 2016-10-12: 10.5 via INTRAVENOUS
  Filled 2016-10-12: qty 11

## 2016-10-16 ENCOUNTER — Other Ambulatory Visit: Payer: Self-pay | Admitting: Family Medicine

## 2016-10-19 ENCOUNTER — Ambulatory Visit: Payer: Medicare Other | Admitting: Internal Medicine

## 2016-10-26 ENCOUNTER — Ambulatory Visit (INDEPENDENT_AMBULATORY_CARE_PROVIDER_SITE_OTHER): Payer: Medicare Other | Admitting: Internal Medicine

## 2016-10-26 ENCOUNTER — Encounter: Payer: Self-pay | Admitting: Internal Medicine

## 2016-10-26 ENCOUNTER — Telehealth: Payer: Self-pay

## 2016-10-26 VITALS — BP 122/84 | HR 79 | Ht 63.0 in | Wt 215.0 lb

## 2016-10-26 DIAGNOSIS — M545 Low back pain, unspecified: Secondary | ICD-10-CM

## 2016-10-26 DIAGNOSIS — Z794 Long term (current) use of insulin: Secondary | ICD-10-CM | POA: Diagnosis not present

## 2016-10-26 DIAGNOSIS — IMO0001 Reserved for inherently not codable concepts without codable children: Secondary | ICD-10-CM

## 2016-10-26 DIAGNOSIS — E1165 Type 2 diabetes mellitus with hyperglycemia: Secondary | ICD-10-CM | POA: Diagnosis not present

## 2016-10-26 LAB — URINALYSIS, ROUTINE W REFLEX MICROSCOPIC
Bilirubin Urine: NEGATIVE
Hgb urine dipstick: NEGATIVE
Ketones, ur: NEGATIVE
Leukocytes, UA: NEGATIVE
Nitrite: NEGATIVE
Specific Gravity, Urine: >=1.03 — AB (ref 1.000–1.030)
Total Protein, Urine: NEGATIVE
Urine Glucose: NEGATIVE
Urobilinogen, UA: 0.2 (ref 0.0–1.0)
pH: 5.5 (ref 5.0–8.0)

## 2016-10-26 LAB — POCT GLYCOSYLATED HEMOGLOBIN (HGB A1C): Hemoglobin A1C: 7.4

## 2016-10-26 MED ORDER — INSULIN DEGLUDEC 200 UNIT/ML ~~LOC~~ SOPN: 16.0000 [IU] | PEN_INJECTOR | Freq: Every day | SUBCUTANEOUS | 2 refills | Status: DC

## 2016-10-26 NOTE — Patient Instructions (Addendum)
Please continue: - Metformin XR 1000 mg 2x a day - Glipizide XL 10 mg in am - Onglyza 5 mg daily  Please move Tresiba in am and increase 16 units at bedtime.  Please stop at the lab.  Please return in 3 months with your sugar log.

## 2016-10-26 NOTE — Telephone Encounter (Signed)
-----   Message from Philemon Kingdom, MD sent at 10/26/2016 12:29 PM EDT ----- Almyra Free, can you please call pt: no UTI.

## 2016-10-26 NOTE — Telephone Encounter (Signed)
Called patient and gave lab results. Patient had no questions or concerns.  

## 2016-10-26 NOTE — Progress Notes (Signed)
Patient ID: Donna Pham, female   DOB: January 14, 1956, 61 y.o.   MRN: [id number]  HPI: Donna Pham is a 61 y.o.-year-old [place] female, returning for f/u for DM2, dx 2000, insulin-dependent, uncontrolled, without complications. Last visit 5 mo ago.  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 He had a failed eye Sx recently.  Last hemoglobin A1c was: Lab Results  Component Value Date   HGBA1C 7.4 06/19/2016   HGBA1C 8.5 (H) 01/28/2016   HGBA1C 7.6 (H) 07/23/2015   Pt is on a regimen of: - Metformin XR 1000 mg 2x a day - Glipizide XL 10 mg - started 10/21/2013 - Onglyza 5 mg daily - Tresiba U200 63 units - started 07/2014 >> Toujeo was ineffective >> back on Tresiba. Invokana 100 mg >> had 3 yeast infections since last visit >> we stopped it. She also stopped Cycloset 3 weeks ago (did not get it from pharmacy). She tried Avandia in the past.  She tried Actos in the past.   Pt checks her sugars 2-3x a day: - am:  64, 74 (after taking dog for a walk), 140-180 >> 69, 82, 91-145, 157 >> 90-140s - 2h after b'fast: n/c >> 124-270 (140-190) >> 160-220 >> n/c >> 130s - before lunch: n/c >> 194, 303 >> 160-170 >> n/c - 2h after lunch: 170-240 >> 156-250 >> 180 >> 146, 179 >> 170-180 - before dinner: n/c >> 164, 178, 220 >> 160-250 >> 117, 163 >> 150-160 - 2h after dinner: (139)160-260 (299) >> 160-254 >> 160  >> 66, 101, 160-181, 196, 200, 236 >> 190 - nighttime: n/c >> 124-230 >> n/c >> 117 >> n/c Lowest: 62 at night x1.  Meter: AccuChek Smart View  Pt's meals are: - Breakfast: coffee + waffle + PB; blueberry - Lunch: chicken noodle soup, stews - Dinner: fried chicken - Snack: 1  - no CKD, last BUN/creatinine:  Lab Results  Component Value Date   BUN 18 01/28/2016   CREATININE 0.81 01/28/2016  24 h urine protein 85 (<150) - last set of lipids: Lab Results  Component Value Date   CHOL 142 01/28/2016   HDL 45.10 01/28/2016   LDLCALC 71 01/28/2016   LDLDIRECT 167.2 08/22/2010   TRIG 129.0 01/28/2016   CHOLHDL 3 01/28/2016  On Crestor and fenofibrate. - last eye exam was on 01/28/2016. No DR.  - no numbness and tingling in her feet.  She also has a history of HTN, HL, h/o cholecystitis, fatty liver.  I reviewed pt's medications, allergies, PMH, social hx, family hx, and changes were documented in the history of present illness. Otherwise, unchanged from my initial visit note.  ROS: Constitutional: no weight gain/loss, no fatigue, no subjective hyperthermia/hypothermia Eyes: no blurry vision, no xerophthalmia ENT: no sore throat, no nodules palpated in throat, no dysphagia/odynophagia, no hoarseness Cardiovascular: no CP/SOB/palpitations/leg swelling Respiratory: no cough/SOB Gastrointestinal: no N/V/D/C/heartburn Musculoskeletal: no muscle/joint aches Skin: no rashes Neurological: no tremors/numbness/tingling/dizziness  PE: BP 122/84 (BP Location: Left Arm, Patient Position: Sitting)   Pulse 79   Ht 5\' 3"  (1.6 m)   Wt 215 lb (97.5 kg)   SpO2 97%   BMI 38.09 kg/m  Wt Readings from Last 3 Encounters:  10/26/16 215 lb (97.5 kg)  10/12/16 218 lb (98.9 kg)  10/02/16 218 lb 12.8 oz (99.2 kg)   Constitutional: obese, in NAD Eyes: PERRLA, EOMI, no exophthalmos ENT: moist mucous membranes, no thyromegaly, no cervical lymphadenopathy Cardiovascular: RRR, No MRG Respiratory: CTA  B Gastrointestinal: abdomen soft, NT, ND, BS+ Musculoskeletal: no deformities, strength intact in all 4, no CVA tenderness Skin: moist, warm, no rashes Neurological: no tremor with outstretched hands, DTR normal in all 4  ASSESSMENT: 1. DM2, insulin-dependent, uncontrolled, without long term complications, but with HGly  2. Back pain  PLAN:  1. Patient with long-standing, uncontrolled diabetes, on oral antidiabetic regimen + basal insulin. Sugars are stable, at or slightly above target in am, but they increase during the day >> will try to move  Tresiba in am and increase the dose slightly. We also discussed about improving diet. - she will go to [place] in June >> will need refills then - I advised her to: Patient Instructions  Please continue: - Metformin XR 1000 mg 2x a day - Glipizide XL 10 mg in am - Onglyza 5 mg daily  Please move Tresiba in am and increase 63 units at bedtime.  Please stop at the lab.  Please return in 3 months with your sugar log.   - continue checking sugars at different times of the day - check 2 times a day, rotating checks - up to date with eye exams - HbA1c today is stable: 7.4% - Return to clinic in 3 mo with sugar log   2. Back pain - will check a  U/A per her request - no dysuria, urinary frequency  Office Visit on 10/26/2016  Component Date Value Ref Range Status  . Color, Urine 10/26/2016 YELLOW  Yellow;Lt. Yellow Final  . APPearance 10/26/2016 CLEAR  Clear Final  . Specific Gravity, Urine 10/26/2016 >=1.030* 1.000 - 1.030 Final  . pH 10/26/2016 5.5  5.0 - 8.0 Final  . Total Protein, Urine 10/26/2016 NEGATIVE  Negative Final  . Urine Glucose 10/26/2016 NEGATIVE  Negative Final  . Ketones, ur 10/26/2016 NEGATIVE  Negative Final  . Bilirubin Urine 10/26/2016 NEGATIVE  Negative Final  . Hgb urine dipstick 10/26/2016 NEGATIVE  Negative Final  . Urobilinogen, UA 10/26/2016 0.2  0.0 - 1.0 Final  . Leukocytes, UA 10/26/2016 NEGATIVE  Negative Final  . Nitrite 10/26/2016 NEGATIVE  Negative Final  . WBC, UA 10/26/2016 0-2/hpf  0-2/hpf Final  . RBC / HPF 10/26/2016 0-2/hpf  0-2/hpf Final  . Hemoglobin A1C 10/26/2016 7.4   Final     Philemon Kingdom, MD PhD [facility]

## 2016-11-08 LAB — HM DIABETES EYE EXAM

## 2016-12-06 ENCOUNTER — Telehealth: Payer: Self-pay | Admitting: Internal Medicine

## 2016-12-06 MED ORDER — INSULIN DEGLUDEC 200 UNIT/ML ~~LOC~~ SOPN: 16.0000 [IU] | PEN_INJECTOR | Freq: Every day | SUBCUTANEOUS | 2 refills | Status: DC

## 2016-12-06 NOTE — Telephone Encounter (Signed)
Patient is requesting a refill of nsulin Degludec (TRESIBA FLEXTOUCH) 200 UNIT/ML SOPN [[id number]]  Order Details to the pharmacy on file.

## 2016-12-06 NOTE — Telephone Encounter (Signed)
Refill submitted. 

## 2016-12-10 ENCOUNTER — Other Ambulatory Visit: Payer: Self-pay | Admitting: Internal Medicine

## 2016-12-13 ENCOUNTER — Encounter: Payer: Self-pay | Admitting: *Deleted

## 2016-12-19 ENCOUNTER — Other Ambulatory Visit: Payer: Self-pay | Admitting: Family Medicine

## 2016-12-19 NOTE — Telephone Encounter (Signed)
Patient needs follow up appointment or CPE last visit with PCP 01/28/16    thx   PC

## 2017-02-12 ENCOUNTER — Telehealth: Payer: Self-pay | Admitting: Internal Medicine

## 2017-02-12 ENCOUNTER — Ambulatory Visit: Payer: Medicare Other | Admitting: Internal Medicine

## 2017-02-12 DIAGNOSIS — Z0289 Encounter for other administrative examinations: Secondary | ICD-10-CM

## 2017-02-12 NOTE — Telephone Encounter (Signed)
Patient no showed today's appt. Please advise on how to follow up. °A. No follow up necessary. °B. Follow up urgent. Contact patient immediately. °C. Follow up necessary. Contact patient and schedule visit in ___ days. °D. Follow up advised. Contact patient and schedule visit in ____weeks. ° °

## 2017-03-11 ENCOUNTER — Other Ambulatory Visit: Payer: Self-pay | Admitting: Internal Medicine

## 2017-03-21 ENCOUNTER — Other Ambulatory Visit: Payer: Self-pay | Admitting: Family Medicine

## 2017-03-21 NOTE — Telephone Encounter (Signed)
Pt was advised to F/U in Jan 2018/needs appt/thx dmf

## 2017-04-23 ENCOUNTER — Other Ambulatory Visit: Payer: Self-pay

## 2017-04-23 MED ORDER — ACCU-CHEK FASTCLIX LANCETS MISC
5 refills | Status: AC
Start: 2017-04-23 — End: ?

## 2017-04-29 ENCOUNTER — Other Ambulatory Visit: Payer: Self-pay | Admitting: Family Medicine

## 2017-05-30 ENCOUNTER — Other Ambulatory Visit: Payer: Self-pay | Admitting: Internal Medicine

## 2017-07-06 ENCOUNTER — Other Ambulatory Visit: Payer: Self-pay | Admitting: Internal Medicine

## 2017-07-18 ENCOUNTER — Encounter: Payer: Self-pay | Admitting: Internal Medicine

## 2017-07-18 NOTE — Progress Notes (Signed)
Received labs from PCP, Dr. Damaris Hippo: 07/06/2017: HbA1c 7.8%

## 2017-08-02 ENCOUNTER — Other Ambulatory Visit: Payer: Self-pay | Admitting: Internal Medicine

## 2017-08-27 ENCOUNTER — Other Ambulatory Visit: Payer: Self-pay | Admitting: Internal Medicine

## 2017-08-30 ENCOUNTER — Telehealth: Payer: Self-pay | Admitting: *Deleted

## 2017-08-30 NOTE — Telephone Encounter (Signed)
Patient does not see Lowne-Chase  Anymore  Copied from [place] [phone]. Topic: General - Call Back - No Documentation >> Aug 28, 2017  5:08 PM Arletha Grippe wrote: Reason for CRM: pt states she is returning call - no crm  Please call [phone]

## 2017-08-31 NOTE — Telephone Encounter (Signed)
Patient not sure what was going on, she maybe [facility] may have called her.  She is not a patient of Dr. Ivy Lynn any more

## 2017-09-07 ENCOUNTER — Other Ambulatory Visit: Payer: Self-pay | Admitting: Family Medicine

## 2017-09-07 DIAGNOSIS — Z1231 Encounter for screening mammogram for malignant neoplasm of breast: Secondary | ICD-10-CM

## 2017-09-27 ENCOUNTER — Ambulatory Visit
Admission: RE | Admit: 2017-09-27 | Discharge: 2017-09-27 | Disposition: A | Discharge status: Home / Self Care | Payer: Medicare Other | Source: Ambulatory Visit | Attending: Family Medicine | Admitting: Family Medicine

## 2017-09-27 DIAGNOSIS — Z1231 Encounter for screening mammogram for malignant neoplasm of breast: Secondary | ICD-10-CM

## 2017-10-09 ENCOUNTER — Encounter: Payer: Self-pay | Admitting: Internal Medicine

## 2017-10-10 ENCOUNTER — Other Ambulatory Visit: Payer: Self-pay | Admitting: Internal Medicine

## 2017-10-16 ENCOUNTER — Encounter: Payer: Self-pay | Admitting: Internal Medicine

## 2017-10-16 NOTE — Progress Notes (Signed)
Received labs from PCP, from 10/09/2017: - HbA1c 7.9%, increased from 7.4% - ACR <7.9 - Glucose 139, BUN/creatinine 21/0.76, GFR 77, otherwise normal CMP

## 2017-11-08 ENCOUNTER — Other Ambulatory Visit: Payer: Self-pay | Admitting: Family Medicine

## 2017-11-28 ENCOUNTER — Ambulatory Visit (INDEPENDENT_AMBULATORY_CARE_PROVIDER_SITE_OTHER): Payer: Medicare Other | Admitting: Internal Medicine

## 2017-11-28 ENCOUNTER — Encounter: Payer: Self-pay | Admitting: Internal Medicine

## 2017-11-28 VITALS — BP 108/70 | HR 99 | Ht 63.0 in | Wt 212.4 lb

## 2017-11-28 DIAGNOSIS — E118 Type 2 diabetes mellitus with unspecified complications: Secondary | ICD-10-CM

## 2017-11-28 DIAGNOSIS — E669 Obesity, unspecified: Secondary | ICD-10-CM | POA: Diagnosis not present

## 2017-11-28 DIAGNOSIS — E785 Hyperlipidemia, unspecified: Secondary | ICD-10-CM

## 2017-11-28 DIAGNOSIS — Z794 Long term (current) use of insulin: Secondary | ICD-10-CM | POA: Diagnosis not present

## 2017-11-28 LAB — LIPID PANEL
Cholesterol: 168 mg/dL (ref 0–200)
HDL: 47.4 mg/dL (ref >39.00)
LDL Cholesterol: 86 mg/dL (ref 0–99)
NonHDL: 120.49
Total CHOL/HDL Ratio: 4
Triglycerides: 174 mg/dL — ABNORMAL HIGH (ref 0.0–149.0)
VLDL: 34.8 mg/dL (ref 0.0–40.0)

## 2017-11-28 MED ORDER — DULAGLUTIDE 0.75 MG/0.5ML ~~LOC~~ SOAJ: SUBCUTANEOUS | 1 refills | Status: DC

## 2017-11-28 NOTE — Progress Notes (Addendum)
Patient ID: Donna Pham, female   DOB: [birth year], 62 y.o.   MRN: [id number]  HPI: Donna Pham is a 61 y.o.-year-old [place] female, returning for f/u for DM2, dx 2000, insulin-dependent, uncontrolled, without long-term complications. Last visit 1 year in 1 month!  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 Her son will move to [place]. She did not  have time to come for an earlier appointment.   Last hemoglobin A1c was: 10/09/2017: HbA1c 7.9% 07/06/2017: HbA1c 7.8% Lab Results  Component Value Date   HGBA1C 7.4 10/26/2016   HGBA1C 7.4 06/19/2016   HGBA1C 8.5 (H) 01/28/2016   Pt is on a regimen of: - Metformin XR 1000 mg 2x a day - Glipizide XL 10 mg - started 10/21/2013 - Onglyza 5 mg daily - Tresiba U200 14 units - started 07/2014 >> Toujeo was ineffective >> back on Tresiba 16 units in am Invokana 100 mg >> had 3 yeast infections since last visit >> we stopped it. She also stopped Cycloset 3 weeks ago (did not get it from pharmacy). She tried Avandia in the past.  She tried Actos in the past.   Pt checks her sugars 2-3x a day: - am:  69, 82, 91-145, 157 >> 90-140s >> 105-120 - 2h after b'fast: 160-220 >> n/c >> 130s >> n/c - before lunch:194, 303 >> 160-170 >> n/c >> 130-170 (late b'fast) - 2h after lunch:  146, 179 >> 170-180 >> 170-180 - before dinner: 117, 163 >> 150-160 >> 130-140 - 2h after dinner: 66, 101, 160-181, 196, 200, 236 >> 190 >> 140-170, 200 (fruit) - nighttime: n/c >> 124-230 >> n/c >> 117 >> n/c Lowest: 62 at night x1 >> 63 x1 -3 mo ago.  Meter: AccuChek Smart View  Pt's meals are: - Breakfast: coffee + waffle + PB; blueberry - Lunch: chicken noodle soup, stews - Dinner: fried chicken - Snack: 1  Received labs from PCP, from 10/09/2017: - HbA1c 7.9%, increased from 7.4% - ACR <7.9 - Glucose 139, BUN/creatinine 21/0.76, GFR 77, otherwise normal CMP  - No CKD, Prev. BUN/creatinine:  Lab Results  Component Value Date    BUN 18 01/28/2016   CREATININE 0.81 01/28/2016  24 h urine protein 85 (<150) Not on an ACE inhibitor/ARB.  - + HL; Prev. lipids: Lab Results  Component Value Date   CHOL 142 01/28/2016   HDL 45.10 01/28/2016   LDLCALC 71 01/28/2016   LDLDIRECT 167.2 08/22/2010   TRIG 129.0 01/28/2016   CHOLHDL 3 01/28/2016  On Crestor 20 and fenofibrate.  - last eye exam was on 10/2016: No DR  - no numbness and tingling in her feet.  On ASA 81.  She also has a history of HTN, fatty liver.  ROS: Constitutional: no weight gain/no weight loss, no fatigue, no subjective hyperthermia, no subjective hypothermia Eyes: no blurry vision, no xerophthalmia ENT: no sore throat, no nodules palpated in throat, no dysphagia, no odynophagia, no hoarseness Cardiovascular: no CP/no SOB/no palpitations/no leg swelling Respiratory: no cough/no SOB/no wheezing Gastrointestinal: no N/no V/no D/no C/+ acid reflux Musculoskeletal: no muscle aches/no joint aches Skin: no rashes, no hair loss Neurological: no tremors/no numbness/no tingling/no dizziness  I reviewed pt's medications, allergies, PMH, social hx, family hx, and changes were documented in the history of present illness. Otherwise, unchanged from my initial visit note.  PE: BP 108/70   Pulse 99   Ht 5\' 3"  (1.6 m)   Wt 212 lb 6.4 oz (  96.3 kg)   SpO2 97%   BMI 37.62 kg/m  Wt Readings from Last 3 Encounters:  11/28/17 212 lb 6.4 oz (96.3 kg)  10/26/16 215 lb (97.5 kg)  10/12/16 218 lb (98.9 kg)   Constitutional: overweight, in NAD Eyes: PERRLA, EOMI, no exophthalmos ENT: moist mucous membranes, no thyromegaly, no cervical lymphadenopathy Cardiovascular: tachycardiaRR, No MRG Respiratory: CTA B Gastrointestinal: abdomen soft, NT, ND, BS+ Musculoskeletal: no deformities, strength intact in all 4 Skin: moist, warm, no rashes Neurological: no tremor with outstretched hands, DTR normal in all 4  ASSESSMENT: 1. DM2, insulin-dependent,  uncontrolled, without long term complications, but with hyperglycemia  2.  Hyperlipidemia  3. Obesity  PLAN:  1. Patient with long-standing, uncontrolled, type 2 diabetes, returning after long absence.  She continues on oral antidiabetic regimen + basal insulin.  At last visit, we moved to receive by in a.m. and increase the dose slightly, to 16 units.  We also discussed about improving diet. - reviewed latest HbA1c: higher, at 7.9% - sugars are at goal in am and get slightly higher as the day goes by >> will switch from Onglyza to Trulicity - discussed benefits and possible SEs of Trulicity - will stop Onglyza after the second Trulicity inj - I advised her to: Patient Instructions  Please continue: - Metformin XR 1000 mg 2x a day with meals - Glipizide XL 10 mg before breakfast - Tresiba 16 units in a.m.  Please start Trulicity 7.12 mg weekly. Let me know when you are close to running out to call in the higher dose to your pharmacy (1.5 mg).  Stop Onglyza before the second injection of Trulicity.  Please return in 3 months with your sugar log.   - continue checking sugars at different times of the day - check 1x a day, rotating checks - advised for yearly eye exams >> she is not UTD - Return to clinic in 3 mo with sugar log    2. HL - Reviewed latest lipid panel from 2017: All fractions at goal, with spectacular decreasing LDL - Continues  Crestor and fenofibrate without side effects.  - will check Lipids today  3. Obesity  - lost net 3 lbs since last visit - continues to try to limit bread intake - will switch to a GLP-1 receptor agonist instead of the DPP 4 inhibitor >> this should help with weight, also  Office Visit on 11/28/2017  Component Date Value Ref Range Status  . Cholesterol 11/28/2017 168  0 - 200 mg/dL Final   ATP III Classification       Desirable:  < 200 mg/dL               Borderline High:  200 - 239 mg/dL          High:  > = 240 mg/dL  . Triglycerides  11/28/2017 174.0* 0.0 - 149.0 mg/dL Final   Normal:  <150 mg/dLBorderline High:  150 - 199 mg/dL  . HDL 11/28/2017 47.40  >39.00 mg/dL Final  . VLDL 11/28/2017 34.8  0.0 - 40.0 mg/dL Final  . LDL Cholesterol 11/28/2017 86  0 - 99 mg/dL Final  . Total CHOL/HDL Ratio 11/28/2017 4   Final                  Men          Women1/2 Average Risk     3.4          3.3Average Risk  5.0          4.42X Average Risk          9.6          7.13X Average Risk          15.0          11.0                      . NonHDL 11/28/2017 120.49   Final   NOTE:  Non-HDL goal should be 30 mg/dL higher than patient's LDL goal (i.e. LDL goal of < 70 mg/dL, would have non-HDL goal of < 100 mg/dL)   Lipids a little worse, but LDL still at goal.  Philemon Kingdom, MD PhD [facility] Endocrinology

## 2017-11-28 NOTE — Patient Instructions (Addendum)
Please continue: - Metformin XR 1000 mg 2x a day with meals - Glipizide XL 10 mg before breakfast - Tresiba 16 units in a.m.  Please start Trulicity 1.02 mg weekly. Let me know when you are close to running out to call in the higher dose to your pharmacy (1.5 mg).  Stop Onglyza before the second injection of Trulicity.  Please return in 3 months with your sugar log.

## 2018-01-04 ENCOUNTER — Telehealth: Payer: Self-pay | Admitting: Internal Medicine

## 2018-01-04 MED ORDER — DULAGLUTIDE 1.5 MG/0.5ML ~~LOC~~ SOAJ: 1.5000 mg | SUBCUTANEOUS | 1 refills | Status: DC

## 2018-01-04 NOTE — Telephone Encounter (Signed)
Sent!

## 2018-01-04 NOTE — Telephone Encounter (Signed)
Need refill need a stronger dosage for medication Trulicity send to  [place], [address] DEA #:  [id number]

## 2018-01-23 DIAGNOSIS — N3000 Acute cystitis without hematuria: Secondary | ICD-10-CM | POA: Diagnosis not present

## 2018-01-23 DIAGNOSIS — E1121 Type 2 diabetes mellitus with diabetic nephropathy: Secondary | ICD-10-CM | POA: Diagnosis not present

## 2018-01-23 DIAGNOSIS — R1011 Right upper quadrant pain: Secondary | ICD-10-CM | POA: Diagnosis not present

## 2018-01-23 DIAGNOSIS — M546 Pain in thoracic spine: Secondary | ICD-10-CM | POA: Diagnosis not present

## 2018-01-24 ENCOUNTER — Other Ambulatory Visit: Payer: Self-pay | Admitting: Family Medicine

## 2018-01-24 DIAGNOSIS — R1011 Right upper quadrant pain: Secondary | ICD-10-CM

## 2018-01-31 ENCOUNTER — Other Ambulatory Visit: Payer: Self-pay | Admitting: Internal Medicine

## 2018-01-31 NOTE — Progress Notes (Signed)
Received latest HbA1c from PCP: 01/23/2018: HbA1c 8.2%, higher

## 2018-02-01 ENCOUNTER — Ambulatory Visit
Admission: RE | Admit: 2018-02-01 | Discharge: 2018-02-01 | Disposition: A | Discharge status: Home / Self Care | Payer: Medicare Other | Source: Ambulatory Visit | Attending: Family Medicine | Admitting: Family Medicine

## 2018-02-01 ENCOUNTER — Other Ambulatory Visit: Payer: Self-pay | Admitting: Family Medicine

## 2018-02-01 ENCOUNTER — Telehealth: Payer: Self-pay | Admitting: Internal Medicine

## 2018-02-01 DIAGNOSIS — R1011 Right upper quadrant pain: Secondary | ICD-10-CM | POA: Diagnosis not present

## 2018-02-01 NOTE — Telephone Encounter (Signed)
Pt is not on Lantus, and is not taking [place] either. Please advise

## 2018-02-01 NOTE — Telephone Encounter (Signed)
Why not? My last OV instructions are:  Please continue: - Metformin XR 1000 mg 2x a day with meals - Glipizide XL 10 mg before breakfast - Tresiba 16 units in a.m.  Please start Trulicity 8.81 mg weekly. Let me know when you are close to running out to call in the higher dose to your pharmacy (1.5 mg).  Stop Onglyza before the second injection of Trulicity.  Please return in 3 months with your sugar log.   Please ask her to restart Tresiba (U200) right away at 16 units and may need to increase the dose as per prev. msg if sugars not improved.

## 2018-02-01 NOTE — Telephone Encounter (Signed)
Dulaglutide (TRULICITY) 1.5 VO/5.9YT SOPN  Patient stated that she is having serve pain in her stomach. She stated Dr Cruzita Lederer just started her on a new medication and that is when the pain started. She would like to know if this is caused from the medication or could be something else.  Please advise

## 2018-02-01 NOTE — Telephone Encounter (Signed)
It could be the Trulicity.  Let us go ahead and start this, but she may need to increase Lantus by at least 5 units.  Please let us know next week if the sugars increase.

## 2018-02-01 NOTE — Telephone Encounter (Signed)
Pt states she got confused. She says she has not been taking [place] or Onglyza for 2 months. She is starting back on Tresiba and will call back next week with her sugar log.

## 2018-02-01 NOTE — Telephone Encounter (Signed)
Spoke with pts husband and he stated he would have the pt call back which per DPR in chart is allowed by patient

## 2018-02-01 NOTE — Telephone Encounter (Signed)
Please advise on below  

## 2018-02-20 ENCOUNTER — Telehealth: Payer: Self-pay | Admitting: Internal Medicine

## 2018-02-20 NOTE — Telephone Encounter (Signed)
Busy tone

## 2018-02-20 NOTE — Telephone Encounter (Signed)
Needs a new RX for [place] sent to [facility] on [address]. Blood sugars have been running high (160). Patient wants to increase to 60 units. Please call patient at ph# [phone].

## 2018-02-21 ENCOUNTER — Other Ambulatory Visit: Payer: Self-pay

## 2018-02-21 MED ORDER — INSULIN DEGLUDEC 200 UNIT/ML ~~LOC~~ SOPN: 16.0000 [IU] | PEN_INJECTOR | Freq: Every day | SUBCUTANEOUS | 2 refills | Status: DC

## 2018-02-21 NOTE — Telephone Encounter (Signed)
LMTCB

## 2018-02-21 NOTE — Telephone Encounter (Signed)
Pt is aware and will call back next week if not getting better sugar control

## 2018-02-21 NOTE — Telephone Encounter (Signed)
Dr. Cruzita Lederer pt, she is sick and has a appointment to see PCP but because she is sick her sugars have been in the high 100s to 200 range everyday. She takes 16 units of Tresiba in the morning and would like to know if she can increase that dosage by 2-4 units until her sugars come back down? Pt also was having bad stomach pains with Trulicity but asked if she should start taking that again. Advised pt that I would ask the covering provider but I do not think she should restart this since she had the sever stomach pain.

## 2018-02-21 NOTE — Telephone Encounter (Signed)
Pt is wondering if she can start Onglyza back since no Trulicity?

## 2018-02-21 NOTE — Telephone Encounter (Signed)
Okay to start Onglyza 1 tablet daily

## 2018-02-21 NOTE — Telephone Encounter (Signed)
Since his sugars are high from acute illness she can increase her Tresiba to 20 units for now and not use Trulicity

## 2018-02-21 NOTE — Telephone Encounter (Signed)
Pt returned you call [facility] number [phone] Check blood sugar now @ 9:03 it is 200

## 2018-02-22 DIAGNOSIS — J029 Acute pharyngitis, unspecified: Secondary | ICD-10-CM | POA: Diagnosis not present

## 2018-02-22 DIAGNOSIS — J209 Acute bronchitis, unspecified: Secondary | ICD-10-CM | POA: Diagnosis not present

## 2018-04-04 ENCOUNTER — Other Ambulatory Visit: Payer: Self-pay | Admitting: Internal Medicine

## 2018-04-11 ENCOUNTER — Ambulatory Visit (INDEPENDENT_AMBULATORY_CARE_PROVIDER_SITE_OTHER): Payer: Medicare Other | Admitting: Internal Medicine

## 2018-04-11 ENCOUNTER — Encounter: Payer: Self-pay | Admitting: Internal Medicine

## 2018-04-11 VITALS — BP 132/78 | HR 75 | Ht 63.0 in | Wt 204.0 lb

## 2018-04-11 DIAGNOSIS — E785 Hyperlipidemia, unspecified: Secondary | ICD-10-CM | POA: Diagnosis not present

## 2018-04-11 DIAGNOSIS — E669 Obesity, unspecified: Secondary | ICD-10-CM | POA: Diagnosis not present

## 2018-04-11 DIAGNOSIS — Z23 Encounter for immunization: Secondary | ICD-10-CM

## 2018-04-11 DIAGNOSIS — Z794 Long term (current) use of insulin: Secondary | ICD-10-CM | POA: Diagnosis not present

## 2018-04-11 DIAGNOSIS — E118 Type 2 diabetes mellitus with unspecified complications: Secondary | ICD-10-CM | POA: Diagnosis not present

## 2018-04-11 LAB — POCT GLYCOSYLATED HEMOGLOBIN (HGB A1C): Hemoglobin A1C: 7 % — AB (ref 4.0–5.6)

## 2018-04-11 MED ORDER — INSULIN DEGLUDEC 200 UNIT/ML ~~LOC~~ SOPN: 16.0000 [IU] | PEN_INJECTOR | Freq: Every day | SUBCUTANEOUS | 2 refills | Status: DC

## 2018-04-11 MED ORDER — INSULIN PEN NEEDLE 32G X 4 MM MISC: 3 refills | Status: DC

## 2018-04-11 MED ORDER — GLIPIZIDE ER 5 MG PO TB24: 5.0000 mg | ORAL_TABLET | Freq: Every day | ORAL | 3 refills | Status: DC

## 2018-04-11 NOTE — Addendum Note (Signed)
Addended by: Cardell Peach I on: 04/11/2018 10:53 AM   Modules accepted: Orders

## 2018-04-11 NOTE — Patient Instructions (Signed)
Please continue: - Metformin XR 1000 mg 2x a day with meals - Onglyza 5 mg before breakfast - Tresiba 16 units in a.m.  Please decrease: - Glipizide XL 5 mg before breakfast  Please return in 3-4 months with your sugar log.

## 2018-04-11 NOTE — Progress Notes (Signed)
Patient ID: Donna Pham, female   DOB: 09-13-1955, 62 y.o.   MRN: [id number]  HPI: Donna Pham is a 62 y.o.-year-old [place] female, returning for f/u for DM2, dx 2000, insulin-dependent, uncontrolled, without long-term complications. Last visit 5.5 months ago.  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  Last hemoglobin A1c was: 01/23/2018: HbA1c 8.2% 10/09/2017: HbA1c 7.9% 07/06/2017: HbA1c 7.8% Lab Results  Component Value Date   HGBA1C 7.4 10/26/2016   HGBA1C 7.4 06/19/2016   HGBA1C 8.5 (H) 01/28/2016   Pt is on a regimen of: - Metformin XR 1000 mg 2x a day with meals - Glipizide XL 10 mg before breakfast - Onglyza 5 mg before breakfast - Tresiba 16 units in a.m. She could not tolerate Trulicity 2/2 AP. Toujeo was ineffective for her. Invokana 100 mg >> had 3 yeast infections since last visit >> we stopped it. She also stopped Cycloset 3 weeks ago (did not get it from pharmacy). She tried Avandia in the past.  She tried Actos in the past.   Pt checks her sugars 1-3 times a day: - am:  90-140s >> 105-120 >> 101-131, 160 - 2h after b'fast: 160-220 >> n/c >> 130s >> n/c - before lunch:n/c >> 130-170 (late b'fast) >> n/c - 2h after lunch:  170-180 >> 170-180 >> n/c - before dinner:  150-160 >> 130-140 >> n/c - 2h after dinner:  190 >> 140-170, 200 (fruit) >> 57, 63 x1 - nighttime: 124-230 >> n/c >> 117 >> n/c >> 128-163, 263 (sweets) Lowest: 62 at night x1 >> 63 x1 >> 57 x1, 63 x1 after dinner  Meter: AccuChek Smart View  Pt's meals are: - Breakfast: coffee + waffle + PB; blueberry - Lunch: chicken noodle soup, stews - Dinner: fried chicken - Snack: 1  Received labs from PCP, from 10/09/2017: - ACR <7.9 - Glucose 139, BUN/creatinine 21/0.76, GFR 77, otherwise normal CMP  -No CKD, Prev. BUN/creatinine:  Lab Results  Component Value Date   BUN 18 01/28/2016   CREATININE 0.81  01/28/2016  24 h urine protein 85 (<150) Not on an ACE inhibitor/ARB.  -+ HL; Prev. lipids: Lab Results  Component Value Date   CHOL 168 11/28/2017   HDL 47.40 11/28/2017   LDLCALC 86 11/28/2017   LDLDIRECT 167.2 08/22/2010   TRIG 174.0 (H) 11/28/2017   CHOLHDL 4 11/28/2017  On Crestor 20 and fenofibrate.  - last eye exam was on 10/2016: No DR  -No numbness and tingling in her feet.    On ASA 81.  She also has a history of HTN, fatty liver.  Her first husband died when she was 58 and he was 21 in a motor vehicle accident in [place].  She moved to [place] afterwards with 1 of her sons.  Her older son is still in [place].  She would like to move back to [place], but her husband does not want to move.  Her younger son will move to [place] today.  He and his family was helping them, but now she is left without a car and she is quite stressed about it.    ROS: Constitutional: no weight gain/no weight loss, no fatigue, no subjective hyperthermia, no subjective hypothermia Eyes: no blurry vision, no xerophthalmia ENT: no sore throat, no nodules palpated in throat, no dysphagia, no odynophagia, no hoarseness Cardiovascular: no CP/no SOB/no palpitations/no leg swelling Respiratory: no cough/no  SOB/no wheezing Gastrointestinal: no N/no V/no D/no C/no acid reflux Musculoskeletal: no muscle aches/no joint aches Skin: no rashes, no hair loss Neurological: no tremors/no numbness/no tingling/no dizziness  I reviewed pt's medications, allergies, PMH, social hx, family hx, and changes were documented in the history of present illness. Otherwise, unchanged from my initial visit note.  Past Medical History:  Diagnosis Date  . Arthritis   . CTS (carpal tunnel syndrome)    6/15-19/09 RLL PNA with sepsis high LFTs (ALT 61)  . Depression   . Diabetes mellitus   . GERD (gastroesophageal reflux disease)   . Hyperlipidemia   . Hypertension   . Right upper quadrant abdominal pain    Past  Surgical History:  Procedure Laterality Date  . EYE SURGERY     Social History   Socioeconomic History  . Marital status: Married    Spouse name: Not on file  . Number of children: 2  . Years of education: Not on file  . Highest education level: Not on file  Occupational History  . Occupation: Retired  Scientific laboratory technician  . Financial resource strain: Not on file  . Food insecurity:    Worry: Not on file    Inability: Not on file  . Transportation needs:    Medical: Not on file    Non-medical: Not on file  Tobacco Use  . Smoking status: Never Smoker  . Smokeless tobacco: Never Used  Substance and Sexual Activity  . Alcohol use: No  . Drug use: No  . Sexual activity: Not on file  Lifestyle  . Physical activity:    Days per week: Not on file    Minutes per session: Not on file  . Stress: Not on file  Relationships  . Social connections:    Talks on phone: Not on file    Gets together: Not on file    Attends religious service: Not on file    Active member of club or organization: Not on file    Attends meetings of clubs or organizations: Not on file    Relationship status: Not on file  . Intimate partner violence:    Fear of current or ex partner: Not on file    Emotionally abused: Not on file    Physically abused: Not on file    Forced sexual activity: Not on file  Other Topics Concern  . Not on file  Social History Narrative   No diet    Regular exercise-no   Originally from [place]   Current Outpatient Medications on File Prior to Visit  Medication Sig Dispense Refill  . ACCU-CHEK FASTCLIX LANCETS MISC Check blood sugar twice daily 200 each 5  . ACCU-CHEK SMARTVIEW test strip CHECK BLOOD SUGAR TWO TIMES DAILY 200 each 0  . Alcohol Swabs (B-D SINGLE USE SWABS REGULAR) PADS Check blood sugar twice daily 200 each 3  . aspirin 81 MG tablet Take 81 mg by mouth daily.      . BD PEN NEEDLE NANO U/F 32G X 4 MM MISC USE ONCE DAILY AS DIRECTED 100 each 0  . BD PEN NEEDLE  NANO U/F 32G X 4 MM MISC USE ONCE DAILY AS DIRECTED 100 each 0  . betamethasone dipropionate (DIPROLENE) 0.05 % cream Apply topically 2 (two) times daily. 30 g 0  . Blood Glucose Monitoring Suppl (ACCU-CHEK NANO SMARTVIEW) w/Device KIT Check blood sugar as directed 1 kit 0  . chlorpheniramine-HYDROcodone (TUSSIONEX PENNKINETIC ER) 10-8 MG/5ML SUER Take 5 mLs by mouth every  12 (twelve) hours as needed for cough. 140 mL 0  . Dulaglutide (TRULICITY) 1.5 SP/2.3RA SOPN Inject 1.5 mg into the skin once a week. 4 pen 1  . esomeprazole (NEXIUM) 40 MG capsule TAKE 1 CAPSULE BY MOUTH EVERY DAY BEFORE BREAKFAST 90 capsule 1  . fenofibrate 160 MG tablet Take 1 tablet (160 mg total) by mouth daily. 90 tablet 1  . glipiZIDE (GLUCOTROL XL) 10 MG 24 hr tablet TAKE 1 TABLET BY MOUTH ONCE DAILY 90 tablet 0  . glucose blood (ACCU-CHEK SMARTVIEW) test strip CHECK BLOOD SUGAR 4x DAILY 300 each 5  . Insulin Degludec (TRESIBA FLEXTOUCH) 200 UNIT/ML SOPN Inject 16 Units into the skin daily before breakfast. 9 pen 2  . metFORMIN (GLUCOPHAGE-XR) 500 MG 24 hr tablet TAKE 4 TABLETS DAILY WITH  SUPPER 360 tablet 0  . rosuvastatin (CRESTOR) 20 MG tablet TAKE 1 AND 1/2 TABLETS BY MOUTH DAILY 135 tablet 0  . sertraline (ZOLOFT) 100 MG tablet Take 2 tablets (200 mg total) by mouth daily. 180 tablet 1   No current facility-administered medications on file prior to visit.    No Known Allergies Family History  Problem Relation Age of Onset  . Arthritis Father   . Cancer Father   . Diabetes Mother   . Heart disease Mother   . Breast cancer Neg Hx    PE: BP 132/78   Pulse 75   Ht 5' 3" (1.6 m) Comment: measured  Wt 204 lb (92.5 kg)   SpO2 96%   BMI 36.14 kg/m  Wt Readings from Last 3 Encounters:  04/11/18 204 lb (92.5 kg)  11/28/17 212 lb 6.4 oz (96.3 kg)  10/26/16 215 lb (97.5 kg)   Constitutional: overweight, in NAD Eyes: PERRLA, EOMI, no exophthalmos ENT: moist mucous membranes, no thyromegaly, no cervical  lymphadenopathy Cardiovascular: RRR, No MRG Respiratory: CTA B Gastrointestinal: abdomen soft, NT, ND, BS+ Musculoskeletal: no deformities, strength intact in all 4 Skin: moist, warm, no rashes Neurological: no tremor with outstretched hands, DTR normal in all 4  ASSESSMENT: 1. DM2, insulin-dependent, uncontrolled, without long term complications, but with hyperglycemia  2.  Hyperlipidemia  3. Obesity  PLAN:  1. Patient with long-standing, uncontrolled, type 2 diabetes, on basal insulin and oral medications.  At last visit, we try to switch from a DPP 4 inhibitor to a GLP-1 receptor agonist, but she could not tolerate this due to abdominal pain.  We also discussed in about improving diet.  Latest HbA1c was reviewed per records from PCP: 8.2% on 01/23/2018, worse.  However, she tells me that she had a.  After our last visit when she also stopped [place] she misunderstood instructions. -At this visit, her weight is better, having lost approximately 8 pounds.  Her sugars also appear better.  However, she has occasional low blood sugars after dinner especially if she eats a lighter dinner.  It is unclear whether she has low blood sugars during the day, but would like to avoid the chance of low blood sugars and will decrease the dose of her glipizide in the morning.  We will keep the Tresiba dose the same for now. - I advised her to: Patient Instructions  Please continue: - Metformin XR 1000 mg 2x a day with meals - Onglyza 5 mg before breakfast - Tresiba 16 units in a.m.  Please decrease: - Glipizide XL 5 mg before breakfast  Please return in 3-4 months with your sugar log.   - today, HbA1c is 7% (better!) -  continue checking sugars at different times of the day - check 1x a day, rotating checks - advised for yearly eye exams >> she is not UTD - Given flu shot today - Return to clinic in 3-4 mo with sugar log     2. HL - Reviewed latest lipid panel from last visit: LDL now at goal,  much decreased from 2012, triglycerides slightly high Lab Results  Component Value Date   CHOL 168 11/28/2017   HDL 47.40 11/28/2017   LDLCALC 86 11/28/2017   LDLDIRECT 167.2 08/22/2010   TRIG 174.0 (H) 11/28/2017   CHOLHDL 4 11/28/2017  - Continues Crestor and fenofibrate without side effects.  3. Obesity  -Lost 8 pounds since last visit -Continues to try to limit bread intake -Continue DPP 4 inhibitor which is weight neutral; unfortunately, she could not tolerate GLP-1 receptor agonist due to abdominal pain   Philemon Kingdom, MD PhD [facility] Endocrinology

## 2018-04-12 ENCOUNTER — Ambulatory Visit: Payer: Medicare Other | Admitting: Internal Medicine

## 2018-04-16 DIAGNOSIS — J069 Acute upper respiratory infection, unspecified: Secondary | ICD-10-CM | POA: Diagnosis not present

## 2018-05-03 DIAGNOSIS — E119 Type 2 diabetes mellitus without complications: Secondary | ICD-10-CM | POA: Diagnosis not present

## 2018-05-20 ENCOUNTER — Other Ambulatory Visit: Payer: Self-pay

## 2018-05-20 MED ORDER — ONGLYZA 5 MG PO TABS: 5.0000 mg | ORAL_TABLET | Freq: Every day | ORAL | 1 refills | Status: DC

## 2018-07-07 ENCOUNTER — Other Ambulatory Visit: Payer: Self-pay | Admitting: Internal Medicine

## 2018-07-15 ENCOUNTER — Other Ambulatory Visit: Payer: Self-pay

## 2018-07-15 ENCOUNTER — Telehealth: Payer: Self-pay | Admitting: Internal Medicine

## 2018-07-15 NOTE — Telephone Encounter (Signed)
glipiZIDE (GLIPIZIDE XL) 5 MG 24 hr tablet  Patient stated that she Received this medication (XL) and does not believe this is correct. She would like a call back to discuss.  Please advise

## 2018-07-15 NOTE — Telephone Encounter (Signed)
I spoke to the patient and the pharmacy sent her the wrong dose of the glipizide- per last office note I advised the patient she should be taking 5 mg not the 10 mg that was sent to her-patient understood this

## 2018-07-19 ENCOUNTER — Other Ambulatory Visit: Payer: Self-pay

## 2018-07-19 MED ORDER — GLIPIZIDE ER 5 MG PO TB24: 5.0000 mg | ORAL_TABLET | Freq: Every day | ORAL | 3 refills | Status: DC

## 2018-07-29 ENCOUNTER — Other Ambulatory Visit: Payer: Self-pay

## 2018-07-29 MED ORDER — ONGLYZA 5 MG PO TABS: 5.0000 mg | ORAL_TABLET | Freq: Every day | ORAL | 1 refills | Status: DC

## 2018-08-13 ENCOUNTER — Encounter: Payer: Self-pay | Admitting: Internal Medicine

## 2018-08-13 ENCOUNTER — Ambulatory Visit (INDEPENDENT_AMBULATORY_CARE_PROVIDER_SITE_OTHER): Payer: Medicare Other | Admitting: Internal Medicine

## 2018-08-13 VITALS — BP 118/72 | HR 76 | Ht 63.0 in | Wt 212.0 lb

## 2018-08-13 DIAGNOSIS — Z794 Long term (current) use of insulin: Secondary | ICD-10-CM

## 2018-08-13 DIAGNOSIS — E785 Hyperlipidemia, unspecified: Secondary | ICD-10-CM | POA: Diagnosis not present

## 2018-08-13 DIAGNOSIS — E669 Obesity, unspecified: Secondary | ICD-10-CM | POA: Diagnosis not present

## 2018-08-13 DIAGNOSIS — E118 Type 2 diabetes mellitus with unspecified complications: Secondary | ICD-10-CM

## 2018-08-13 LAB — POCT GLYCOSYLATED HEMOGLOBIN (HGB A1C): Hemoglobin A1C: 7.9 % — AB (ref 4.0–5.6)

## 2018-08-13 MED ORDER — INSULIN DEGLUDEC 200 UNIT/ML ~~LOC~~ SOPN: 16.0000 [IU] | PEN_INJECTOR | Freq: Every day | SUBCUTANEOUS | 3 refills | Status: DC

## 2018-08-13 MED ORDER — GLUCOSE BLOOD VI STRP: ORAL_STRIP | 3 refills | Status: DC

## 2018-08-13 MED ORDER — METFORMIN HCL ER 500 MG PO TB24: ORAL_TABLET | ORAL | 3 refills | Status: DC

## 2018-08-13 NOTE — Addendum Note (Signed)
Addended by: Cardell Peach I on: 08/13/2018 02:38 PM   Modules accepted: Orders

## 2018-08-13 NOTE — Progress Notes (Signed)
Patient ID: Donna Pham, female   DOB: [birth year], 63 y.o.   MRN: [id number]  HPI: Donna Pham is a 63 y.o.-year-old [place] female, returning for f/u for DM2, dx 2000, insulin-dependent, uncontrolled, without long-term complications. Last visit 4 months ago.  She continues to have a lot of stress that she is a caregiver for her husband who is 94 years older than her and has dementia.  He was in and out of the hospital and a nursing home since last visit.  She also had dietary indiscretions over the holidays (cookies).  Sugars are higher.  Last hemoglobin A1c was: Lab Results  Component Value Date   HGBA1C 7.0 (A) 04/11/2018   HGBA1C 7.4 10/26/2016   HGBA1C 7.4 06/19/2016  01/23/2018: HbA1c 8.2% 10/09/2017: HbA1c 7.9% 07/06/2017: HbA1c 7.8%  Pt is on a regimen of: - Metformin XR 1000 mg 2x a day with meals - Onglyza 5 mg before breakfast - Glipizide XL 5 mg before breakfast - Tresiba 16 units in a.m.  She could not tolerate Trulicity 2/2 AP. Toujeo was ineffective for her. Invokana 100 mg >> had 3 yeast infections since last visit >> we stopped it. She also stopped Cycloset 3 weeks ago (did not get it from pharmacy). She tried Avandia in the past.  She tried Actos in the past.   Pt checks her sugars 1-3 times a day: - am:  105-120 >> 101-131, 160 >> 67-140, 156 - 2h after b'fast: 130s >> n/c >> 66, 121-176, 206 - before lunch: 130-170 >> n/c >> 149-189, 256 - 2h after lunch:  170-180 >> 170-180 >> n/c >> 154-229 - before dinner:  150-160 >> 130-140 >> n/c >> 82-154 - 2h after dinner: 140-170, 200 >> 57, 63 x1 >> 169-207 - nighttime: 117 >> n/c >> 128-163, 263 (sweets) >> 125-201 Lowest: 62 at night x1 >> 63 x1 >> 57 x1, 63 x1 after dinner >> 67.  Meter: AccuChek Smart View  Pt's meals are: - Breakfast: coffee + waffle + PB; blueberry - Lunch: chicken noodle soup, stews - Dinner: fried chicken - Snack: 1  Reviewed labs from 10/09/2017: - ACR <7.9 - Glucose  139, BUN/creatinine 21/0.76, GFR 77, otherwise normal CMP  -No CKD, Prev. BUN/creatinine:  Lab Results  Component Value Date   BUN 18 01/28/2016   CREATININE 0.81 01/28/2016  24 h urine protein 85 (<150) Not on an ACE inhibitor/ARB  -+ HL; reviewed latest lipid panel: Lab Results  Component Value Date   CHOL 168 11/28/2017   HDL 47.40 11/28/2017   LDLCALC 86 11/28/2017   LDLDIRECT 167.2 08/22/2010   TRIG 174.0 (H) 11/28/2017   CHOLHDL 4 11/28/2017  On Crestor 20 and fenofibrate.  - last eye exam was on 10/2016: No DR  -She denies numbness and tingling in her feet.    On ASA 81.  She also has a history of HTN, fatty liver.  Her first husband died when she was 84 and he was 65 in a motor vehicle accident in [place].  She moved to [place] afterwards with 1 of her sons.  Her older son is still in [place].  She would like to move back to [place], but her husband does not want to move.  Her younger son will move to [place] today.  He and his family was helping them, but now she is left without a car and she is quite stressed about it.    ROS: Constitutional: + weight gain/no weight loss, no fatigue, no subjective  hyperthermia, no subjective hypothermia Eyes: no blurry vision, no xerophthalmia ENT: no sore throat, no nodules palpated in neck, no dysphagia, no odynophagia, no hoarseness Cardiovascular: no CP/no SOB/no palpitations/no leg swelling Respiratory: no cough/no SOB/no wheezing Gastrointestinal: no N/no V/no D/no C/no acid reflux Musculoskeletal: no muscle aches/no joint aches Skin: no rashes, no hair loss Neurological: no tremors/no numbness/no tingling/no dizziness  I reviewed pt's medications, allergies, PMH, social hx, family hx, and changes were documented in the history of present illness. Otherwise, unchanged from my initial visit note.  Past Medical History:  Diagnosis Date  . Arthritis   . CTS (carpal tunnel syndrome)    6/15-19/09 RLL PNA with sepsis high  LFTs (ALT 61)  . Depression   . Diabetes mellitus   . GERD (gastroesophageal reflux disease)   . Hyperlipidemia   . Hypertension   . Right upper quadrant abdominal pain    Past Surgical History:  Procedure Laterality Date  . EYE SURGERY     Social History   Socioeconomic History  . Marital status: Married    Spouse name: Not on file  . Number of children: 2  . Years of education: Not on file  . Highest education level: Not on file  Occupational History  . Occupation: Retired  Scientific laboratory technician  . Financial resource strain: Not on file  . Food insecurity:    Worry: Not on file    Inability: Not on file  . Transportation needs:    Medical: Not on file    Non-medical: Not on file  Tobacco Use  . Smoking status: Never Smoker  . Smokeless tobacco: Never Used  Substance and Sexual Activity  . Alcohol use: No  . Drug use: No  . Sexual activity: Not on file  Lifestyle  . Physical activity:    Days per week: Not on file    Minutes per session: Not on file  . Stress: Not on file  Relationships  . Social connections:    Talks on phone: Not on file    Gets together: Not on file    Attends religious service: Not on file    Active member of club or organization: Not on file    Attends meetings of clubs or organizations: Not on file    Relationship status: Not on file  . Intimate partner violence:    Fear of current or ex partner: Not on file    Emotionally abused: Not on file    Physically abused: Not on file    Forced sexual activity: Not on file  Other Topics Concern  . Not on file  Social History Narrative   No diet    Regular exercise-no   Originally from [place]   Current Outpatient Medications on File Prior to Visit  Medication Sig Dispense Refill  . ACCU-CHEK FASTCLIX LANCETS MISC Check blood sugar twice daily 200 each 5  . ACCU-CHEK SMARTVIEW test strip CHECK BLOOD SUGAR TWO TIMES DAILY 200 each 0  . Alcohol Swabs (B-D SINGLE USE SWABS REGULAR) PADS Check blood  sugar twice daily 200 each 3  . aspirin 81 MG tablet Take 81 mg by mouth daily.      . BD PEN NEEDLE NANO U/F 32G X 4 MM MISC USE ONCE DAILY AS DIRECTED 100 each 0  . betamethasone dipropionate (DIPROLENE) 0.05 % cream Apply topically 2 (two) times daily. 30 g 0  . Blood Glucose Monitoring Suppl (ACCU-CHEK NANO SMARTVIEW) w/Device KIT Check blood sugar as directed 1 kit 0  .  chlorpheniramine-HYDROcodone (TUSSIONEX PENNKINETIC ER) 10-8 MG/5ML SUER Take 5 mLs by mouth every 12 (twelve) hours as needed for cough. 140 mL 0  . esomeprazole (NEXIUM) 40 MG capsule TAKE 1 CAPSULE BY MOUTH EVERY DAY BEFORE BREAKFAST 90 capsule 1  . fenofibrate 160 MG tablet Take 1 tablet (160 mg total) by mouth daily. 90 tablet 1  . glipiZIDE (GLIPIZIDE XL) 5 MG 24 hr tablet Take 1 tablet (5 mg total) by mouth daily with breakfast. 90 tablet 3  . glucose blood (ACCU-CHEK SMARTVIEW) test strip CHECK BLOOD SUGAR 4x DAILY 300 each 5  . Insulin Degludec (TRESIBA FLEXTOUCH) 200 UNIT/ML SOPN Inject 16 Units into the skin daily before breakfast. 9 pen 2  . Insulin Pen Needle (BD PEN NEEDLE NANO U/F) 32G X 4 MM MISC USE ONCE DAILY AS DIRECTED 100 each 3  . metFORMIN (GLUCOPHAGE-XR) 500 MG 24 hr tablet TAKE 4 TABLETS DAILY WITH  SUPPER 360 tablet 0  . ONGLYZA 5 MG TABS tablet Take 1 tablet (5 mg total) by mouth daily before breakfast. 90 tablet 1  . rosuvastatin (CRESTOR) 20 MG tablet TAKE 1 AND 1/2 TABLETS BY MOUTH DAILY 135 tablet 0  . sertraline (ZOLOFT) 100 MG tablet Take 2 tablets (200 mg total) by mouth daily. 180 tablet 1   No current facility-administered medications on file prior to visit.    Allergies  Allergen Reactions  . Trulicity [Dulaglutide] Other (See Comments)    AP   Family History  Problem Relation Age of Onset  . Arthritis Father   . Cancer Father   . Diabetes Mother   . Heart disease Mother   . Breast cancer Neg Hx    PE: BP 118/72   Pulse 76   Ht _0  (1.6 m)   Wt 212 lb (96.2 kg)   SpO2  95%   BMI 37.55 kg/m  Wt Readings from Last 3 Encounters:  08/13/18 212 lb (96.2 kg)  04/11/18 204 lb (92.5 kg)  11/28/17 212 lb 6.4 oz (96.3 kg)   Constitutional: overweight, in NAD Eyes: PERRLA, EOMI, no exophthalmos ENT: moist mucous membranes, no thyromegaly, no cervical lymphadenopathy Cardiovascular: RRR, No MRG Respiratory: CTA B Gastrointestinal: abdomen soft, NT, ND, BS+ Musculoskeletal: no deformities, strength intact in all 4 Skin: moist, warm, no rashes Neurological: no tremor with outstretched hands, DTR normal in all 4  ASSESSMENT: 1. DM2, insulin-dependent, uncontrolled, without long term complications, but with hyperglycemia  2.  Hyperlipidemia  3. Obesity  PLAN:  1. Patient with longstanding, uncontrolled, type 2 diabetes, on basal insulin and oral medications.  Her HbA1c was better at last visit and she lost approximately 8 pounds.  She had occasional low blood sugars after dinner especially after eating a light dinner.  We discussed about reducing glipizide dose.  We continue the rest of the medicines.  She could not tolerate the GLP-1 receptor agonist due to abdominal pain.  However, at this visit, she brings her box of Trulicity and wonders whether she could retry this.  Also, insurance sent her a letter that Onglyza is not covered anymore.  We decided to try Trulicity again.  I gave her a sample box of low-dose Trulicity, 0.35 mg weekly and I advised her to start with this and then try to go to the higher dose, 1.5 mg weekly if she tolerates this well.  We will stop Onglyza as we are starting the GLP-1 receptor agonist. -We will not make any other changes in her glipizide, metformin, and  Tyler Aas for now. - I advised her to: Patient Instructions  Please continue: - Metformin XR 1000 mg 2x a day with meals - Glipizide XL 5 mg before breakfast - Tresiba 16 units in a.m.  Try to add back Trulicity 1.5 Mg weekly.  Stop Onglyza.  Please return in 3-4 months with  your sugar log  - today, HbA1c is 7.9% (higher) - continue checking sugars at different times of the day - check 1x a day, rotating checks - advised for yearly eye exams >> she is not UTD - Return to clinic in 3-4 mo with sugar log      2. HL - Reviewed latest lipid panel from 11/2017: LDL much improved, TG slightly high Lab Results  Component Value Date   CHOL 168 11/28/2017   HDL 47.40 11/28/2017   LDLCALC 86 11/28/2017   LDLDIRECT 167.2 08/22/2010   TRIG 174.0 (H) 11/28/2017   CHOLHDL 4 11/28/2017  - Continues Crestor and fenofibrate without side effects.  3. Obesity  - gained 8 lbs since last OV, over the [place] to try to limit bread intake -I am hoping she can tolerate the GLP-1 receptor agonist now.  This will also help with weight loss.   Philemon Kingdom, MD PhD [facility] Endocrinology

## 2018-08-13 NOTE — Patient Instructions (Addendum)
Please continue: - Metformin XR 1000 mg 2x a day with meals - Glipizide XL 5 mg before breakfast - Tresiba 16 units in a.m.  Try to add back Trulicity 1.5 Mg weekly.  Stop Onglyza.  Please return in 3-4 months with your sugar log

## 2018-08-22 ENCOUNTER — Other Ambulatory Visit: Payer: Self-pay | Admitting: Family Medicine

## 2018-08-22 DIAGNOSIS — Z1231 Encounter for screening mammogram for malignant neoplasm of breast: Secondary | ICD-10-CM

## 2018-09-09 ENCOUNTER — Telehealth: Payer: Self-pay | Admitting: Internal Medicine

## 2018-09-09 MED ORDER — INSULIN DEGLUDEC 200 UNIT/ML ~~LOC~~ SOPN: 16.0000 [IU] | PEN_INJECTOR | Freq: Every day | SUBCUTANEOUS | 3 refills | Status: DC

## 2018-09-09 NOTE — Telephone Encounter (Signed)
RX sent

## 2018-09-09 NOTE — Telephone Encounter (Signed)
MEDICATION: Trulicity  PHARMACY:  [facility] on [address]  IS THIS A 90 DAY SUPPLY : Yes  IS PATIENT OUT OF MEDICATION: Yes  IF NOT; HOW MUCH IS LEFT:   LAST APPOINTMENT DATE: @1 /28/2020  NEXT APPOINTMENT DATE:@5 /28/2020  DO WE HAVE YOUR PERMISSION TO LEAVE A DETAILED MESSAGE: Yes  OTHER COMMENTS:    **Let patient know to contact pharmacy at the end of the day to make sure medication is ready. **  ** Please notify patient to allow 48-72 hours to process**  **Encourage patient to contact the pharmacy for refills or they can request refills through [facility]**

## 2018-09-11 ENCOUNTER — Other Ambulatory Visit: Payer: Self-pay | Admitting: Internal Medicine

## 2018-09-19 ENCOUNTER — Telehealth: Payer: Self-pay

## 2018-09-19 NOTE — Telephone Encounter (Signed)
Patient presented to clinic with recent blood sugar readings.  Patient states her sugars have been too low with current dose of metformin and Tresiba.  Patient was also recently started on weekly Trulicity.  Dr. Cruzita Lederer reviewed her logs and wants her to take 2 tabs of metformin daily, decrease the Tresiba to 10 units and bring [place] another set of blood sugar readings in two weeks to see if we can decrease Tyler Aas more or even stop it.

## 2018-09-20 NOTE — Telephone Encounter (Signed)
Notified patient of message from Dr. Gherghe, patient expressed understanding and agreement. No further questions.  

## 2018-09-30 ENCOUNTER — Ambulatory Visit: Payer: Medicare Other

## 2018-10-14 ENCOUNTER — Other Ambulatory Visit: Payer: Self-pay | Admitting: Internal Medicine

## 2018-10-14 ENCOUNTER — Telehealth: Payer: Self-pay | Admitting: Internal Medicine

## 2018-10-14 NOTE — Telephone Encounter (Signed)
Per [facility] states she is calling about her medication."

## 2018-11-18 ENCOUNTER — Other Ambulatory Visit: Payer: Self-pay | Admitting: *Deleted

## 2018-12-12 ENCOUNTER — Ambulatory Visit (INDEPENDENT_AMBULATORY_CARE_PROVIDER_SITE_OTHER): Payer: Medicare Other | Admitting: Internal Medicine

## 2018-12-12 ENCOUNTER — Other Ambulatory Visit: Payer: Self-pay

## 2018-12-12 ENCOUNTER — Encounter: Payer: Self-pay | Admitting: Internal Medicine

## 2018-12-12 DIAGNOSIS — E785 Hyperlipidemia, unspecified: Secondary | ICD-10-CM

## 2018-12-12 DIAGNOSIS — E669 Obesity, unspecified: Secondary | ICD-10-CM

## 2018-12-12 DIAGNOSIS — E118 Type 2 diabetes mellitus with unspecified complications: Secondary | ICD-10-CM

## 2018-12-12 DIAGNOSIS — Z794 Long term (current) use of insulin: Secondary | ICD-10-CM

## 2018-12-12 MED ORDER — DULAGLUTIDE 0.75 MG/0.5ML ~~LOC~~ SOAJ: SUBCUTANEOUS | 5 refills | Status: DC

## 2018-12-12 MED ORDER — INSULIN DEGLUDEC 200 UNIT/ML ~~LOC~~ SOPN: 10.0000 [IU] | PEN_INJECTOR | Freq: Every day | SUBCUTANEOUS | 3 refills | Status: DC

## 2018-12-12 NOTE — Patient Instructions (Addendum)
Please  continue: - Metformin XR 1000 mg 2x a day with meals - Glipizide XL 5 mg before breakfast - Tresiba 10 units daily  Please decrease: - Trulicity 8.18 mg weekly  Please return in 3-4 months with your sugar log

## 2018-12-12 NOTE — Progress Notes (Signed)
Patient ID: Delayna Sparlin, female   DOB: October 14, 1955, 63 y.o.   MRN: [id number]  Patient location: Home My location: Office  Referring Provider: Lois Huxley, PA  I connected with the patient on 12/12/18 at 10:39 AM EDT by telephone and verified that I am speaking with the correct person.   I discussed the limitations of evaluation and management by telephone and the availability of in person appointments. The patient expressed understanding and agreed to proceed.   Details of the encounter are shown below.  HPI: Mickaela Starlin is a 63 y.o.-year-old [place] female,  presenting for f/u for DM2, dx 2000, insulin-dependent, uncontrolled, without long-term complications. Last visit 4 months ago.  She continues to have a lot of stress as a caregiver for her husband who is 57 years older than her and has dementia.    Last hemoglobin A1c was: Lab Results  Component Value Date   HGBA1C 7.9 (A) 08/13/2018   HGBA1C 7.0 (A) 04/11/2018   HGBA1C 7.4 10/26/2016  01/23/2018: HbA1c 8.2% 10/09/2017: HbA1c 7.9% 07/06/2017: HbA1c 7.8%  Pt is on a regimen of: - Metformin XR 1000 mg 2x a day with meals - Onglyza 5 mg before breakfast >> Trulicity 1.5 mg weekly - has abdominal pain and diarrhea after she eats or drinks sodas - Glipizide XL 5 mg before breakfast - Tresiba 10 units in a.m. She could not tolerate in the past Trulicity 2/2 AP. Toujeo was ineffective for her. Invokana 100 mg >> had 3 yeast infections since last visit >> we stopped it. She also stopped Cycloset 3 weeks ago (did not get it from pharmacy). She tried Avandia in the past.  She tried Actos in the past.   Pt checks her sugars 1-3 times a day - am: 101-131, 160 >> 67-140, 156 >> 80-100 - 2h after b'fast:  n/c >> 66, 121-176, 206 >> 130-150 - before lunch: n/c >> 149-189, 256 >> 120-170 (fruit) - 2h after lunch: 170-180 >> n/c >> 154-229 >> 155-160 - before dinner: 130-140 >> n/c >> 82-154 >> 170 (snack) - 2h after  dinner: 57, 63 x1 >> 169-207 >> 170 - nighttime:  128-163, 263  >> 125-201 >> n/c Lowest: 57 x1, 63 x1 after dinner >> 67 >> 80.  Meter: AccuChek Smart View  Pt's meals are: - Breakfast: coffee + waffle + PB; blueberry - Lunch: chicken noodle soup, stews - Dinner: fried chicken - Snack: 1  Reviewed labs from 10/09/2017: - ACR <7.9 - Glucose 139, BUN/creatinine 21/0.76, GFR 77, otherwise normal CMP  -No CKD, Prev. BUN/creatinine:  Lab Results  Component Value Date   BUN 18 01/28/2016   CREATININE 0.81 01/28/2016  24 h urine protein 85 (<150) Not on an ACE inhibitor/ARB.  -+ HL; latest lipid panel: Lab Results  Component Value Date   CHOL 168 11/28/2017   HDL 47.40 11/28/2017   LDLCALC 86 11/28/2017   LDLDIRECT 167.2 08/22/2010   TRIG 174.0 (H) 11/28/2017   CHOLHDL 4 11/28/2017  On Crestor 20 and fenofibrate.  - last eye exam was on 04/2018: No DR  -no numbness and tingling in her feet.    On ASA 81.  She also has a history of HTN, fatty liver.  Her first husband died when she was 29 and he was 30 in a motor vehicle accident in [place].  She moved to [place] afterwards with 1 of her sons.  Her older son is still in [place].  She would like to move back to  [place], but her husband does not want to move.  Her younger son will move to [place] today.  He and his family was helping them, but now she is left without a car and she is quite stressed about it.    ROS: Constitutional: no weight gain/no weight loss, no fatigue, no subjective hyperthermia, no subjective hypothermia Eyes: no blurry vision, no xerophthalmia ENT: no sore throat, no nodules palpated in neck, no dysphagia, no odynophagia, no hoarseness Cardiovascular: no CP/no SOB/no palpitations/no leg swelling Respiratory: no cough/no SOB/no wheezing Gastrointestinal: no N/no V/no D/no C/no acid reflux Musculoskeletal: no muscle aches/no joint aches Skin: no rashes, no hair loss Neurological: no tremors/no  numbness/no tingling/no dizziness  I reviewed pt's medications, allergies, PMH, social hx, family hx, and changes were documented in the history of present illness. Otherwise, unchanged from my initial visit note.  Past Medical History:  Diagnosis Date  . Arthritis   . CTS (carpal tunnel syndrome)    6/15-19/09 RLL PNA with sepsis high LFTs (ALT 61)  . Depression   . Diabetes mellitus   . GERD (gastroesophageal reflux disease)   . Hyperlipidemia   . Hypertension   . Right upper quadrant abdominal pain    Past Surgical History:  Procedure Laterality Date  . EYE SURGERY     Social History   Socioeconomic History  . Marital status: Married    Spouse name: Not on file  . Number of children: 2  . Years of education: Not on file  . Highest education level: Not on file  Occupational History  . Occupation: Retired  Scientific laboratory technician  . Financial resource strain: Not on file  . Food insecurity:    Worry: Not on file    Inability: Not on file  . Transportation needs:    Medical: Not on file    Non-medical: Not on file  Tobacco Use  . Smoking status: Never Smoker  . Smokeless tobacco: Never Used  Substance and Sexual Activity  . Alcohol use: No  . Drug use: No  . Sexual activity: Not on file  Lifestyle  . Physical activity:    Days per week: Not on file    Minutes per session: Not on file  . Stress: Not on file  Relationships  . Social connections:    Talks on phone: Not on file    Gets together: Not on file    Attends religious service: Not on file    Active member of club or organization: Not on file    Attends meetings of clubs or organizations: Not on file    Relationship status: Not on file  . Intimate partner violence:    Fear of current or ex partner: Not on file    Emotionally abused: Not on file    Physically abused: Not on file    Forced sexual activity: Not on file  Other Topics Concern  . Not on file  Social History Narrative   No diet    Regular  exercise-no   Originally from [place]   Current Outpatient Medications on File Prior to Visit  Medication Sig Dispense Refill  . ACCU-CHEK FASTCLIX LANCETS MISC Check blood sugar twice daily 200 each 5  . ACCU-CHEK SMARTVIEW test strip CHECK BLOOD SUGAR FOUR TIMES DAILY 300 each 4  . Alcohol Swabs (B-D SINGLE USE SWABS REGULAR) PADS Check blood sugar twice daily 200 each 3  . aspirin 81 MG tablet Take 81 mg by mouth daily.      Marland Kitchen  BD PEN NEEDLE NANO U/F 32G X 4 MM MISC USE ONCE DAILY AS DIRECTED 100 each 0  . betamethasone dipropionate (DIPROLENE) 0.05 % cream Apply topically 2 (two) times daily. 30 g 0  . Blood Glucose Monitoring Suppl (ACCU-CHEK NANO SMARTVIEW) w/Device KIT Check blood sugar as directed 1 kit 0  . esomeprazole (NEXIUM) 40 MG capsule TAKE 1 CAPSULE BY MOUTH EVERY DAY BEFORE BREAKFAST 90 capsule 1  . glipiZIDE (GLIPIZIDE XL) 5 MG 24 hr tablet Take 1 tablet (5 mg total) by mouth daily with breakfast. 90 tablet 3  . Insulin Degludec (TRESIBA FLEXTOUCH) 200 UNIT/ML SOPN Inject 16 Units into the skin daily before breakfast. 9 pen 3  . Insulin Pen Needle (BD PEN NEEDLE NANO U/F) 32G X 4 MM MISC USE ONCE DAILY AS DIRECTED 100 each 3  . metFORMIN (GLUCOPHAGE-XR) 500 MG 24 hr tablet TAKE 4 TABLETS DAILY WITH  SUPPER 360 tablet 3  . rosuvastatin (CRESTOR) 20 MG tablet TAKE 1 AND 1/2 TABLETS BY MOUTH DAILY 135 tablet 0  . sertraline (ZOLOFT) 100 MG tablet Take 2 tablets (200 mg total) by mouth daily. 180 tablet 1  . TRULICITY 1.5 OI/7.1IW SOPN INJECT 1.5MG INTO THE SKIN ONCE A WEEK 6 mL 1   No current facility-administered medications on file prior to visit.    Allergies  Allergen Reactions  . Trulicity [Dulaglutide] Other (See Comments)    AP   Family History  Problem Relation Age of Onset  . Arthritis Father   . Cancer Father   . Diabetes Mother   . Heart disease Mother   . Breast cancer Neg Hx    PE: There were no vitals taken for this visit. Wt Readings from Last 3  Encounters:  08/13/18 212 lb (96.2 kg)  04/11/18 204 lb (92.5 kg)  11/28/17 212 lb 6.4 oz (96.3 kg)   Constitutional:  in NAD  The physical exam was not performed (telephone visit).  ASSESSMENT: 1. DM2, insulin-dependent, uncontrolled, without long term complications, but with hyperglycemia  2.  Hyperlipidemia  3. Obesity  PLAN:  1. Patient with longstanding, uncontrolled, type 2 diabetes, on basal insulin, oral medications, and weekly GLP-1 receptor agonist recommended at last visit.  She was on Onglyza before this past, she tried Trulicity but she developed abdominal pain, however, she wanted to retry this.  She still had Trulicity at home.  At last visit, HbA1c was higher, at 7.9%, after the holidays. - at this visit, sugars are much better after she added Trulicity, but she continues to have abdominal pain and diarrhea.  We discussed about trying to reduce the dose of Trulicity to 5.80 milligrams weekly.  She will let me know if she continues to have abdominal symptoms, if so, will need to stop Trulicity completely. -2 months ago, since she develops lows after she started Trulicity, we decreased the dose of Tresiba.  As of now, sugars are better (only with occasional spikes before meals when she snacks) so we will continue the current dose of Tresiba.  She will probably need a higher dose if we had to stop it GLP-1 receptor agonist.   - I advised her to: Patient Instructions  Please  continue: - Metformin XR 1000 mg 2x a day with meals - Glipizide XL 5 mg before breakfast - Tresiba 10 units daily  Please decrease: - Trulicity 9.98 mg weekly  Please return in 3-4 months with your sugar log  -  We will check her HbA1c when she returns  to the clinic - continue checking sugars at different times of the day - check 1x a day, rotating checks - advised for yearly eye exams >> she is UTD - Return to clinic in 3-4 mo with sugar log       2. HL - Reviewed latest lipid panel from a  year ago: LDL at goal, triglycerides slightly high Lab Results  Component Value Date   CHOL 168 11/28/2017   HDL 47.40 11/28/2017   LDLCALC 86 11/28/2017   LDLDIRECT 167.2 08/22/2010   TRIG 174.0 (H) 11/28/2017   CHOLHDL 4 11/28/2017  - Continues Crestor and fenofibrate without side effects.  3. Obesity  -She gained 8 pounds before last visit, most likely due to the holidays -At last visit we added GLP-1 receptor agonist, which should also help with weight.  We will continue with this, but due to occasional abdominal pain and diarrhea, will need to decrease the dose.  - time spent with the patient: 18 min, of which >50% was spent in obtaining information about her symptoms, reviewing her previous labs, evaluations, and treatments, counseling her about her conditions (please see the discussed topics above), and developing a plan to further investigate and treat them; she had a number of questions which I addressed.  Philemon Kingdom, MD PhD [facility] Endocrinology

## 2018-12-24 ENCOUNTER — Encounter: Payer: Self-pay | Admitting: Internal Medicine

## 2018-12-25 ENCOUNTER — Encounter: Payer: Self-pay | Admitting: Internal Medicine

## 2018-12-25 NOTE — Progress Notes (Signed)
Received HbA1c level checked by PCP on 12/24/2018: 7.4%, improved from before:  Lab Results  Component Value Date   HGBA1C 7.9 (A) 08/13/2018

## 2019-02-01 ENCOUNTER — Other Ambulatory Visit: Payer: Self-pay | Admitting: Family Medicine

## 2019-02-01 DIAGNOSIS — R519 Headache, unspecified: Secondary | ICD-10-CM

## 2019-02-18 ENCOUNTER — Ambulatory Visit
Admission: RE | Admit: 2019-02-18 | Discharge: 2019-02-18 | Disposition: A | Discharge status: Home / Self Care | Payer: Medicare Other | Source: Ambulatory Visit | Attending: Family Medicine | Admitting: Family Medicine

## 2019-02-18 DIAGNOSIS — R519 Headache, unspecified: Secondary | ICD-10-CM

## 2019-02-18 MED ORDER — IOPAMIDOL (ISOVUE-300) INJECTION 61%
75.0000 mL | Freq: Once | INTRAVENOUS | Status: AC | PRN
  Administered 2019-02-18: 75 mL via INTRAVENOUS

## 2019-03-25 ENCOUNTER — Other Ambulatory Visit: Payer: Self-pay | Admitting: Internal Medicine

## 2019-04-24 ENCOUNTER — Other Ambulatory Visit: Payer: Self-pay

## 2019-04-24 DIAGNOSIS — Z20822 Contact with and (suspected) exposure to covid-19: Secondary | ICD-10-CM

## 2019-04-25 LAB — NOVEL CORONAVIRUS, NAA: SARS-CoV-2, NAA: NOT DETECTED

## 2019-04-30 ENCOUNTER — Ambulatory Visit
Admission: RE | Admit: 2019-04-30 | Discharge: 2019-04-30 | Disposition: A | Discharge status: Home / Self Care | Payer: Medicare Other | Source: Ambulatory Visit | Attending: Family Medicine | Admitting: Family Medicine

## 2019-04-30 ENCOUNTER — Other Ambulatory Visit: Payer: Self-pay

## 2019-04-30 DIAGNOSIS — Z1231 Encounter for screening mammogram for malignant neoplasm of breast: Secondary | ICD-10-CM

## 2019-05-06 ENCOUNTER — Other Ambulatory Visit: Payer: Self-pay | Admitting: Internal Medicine

## 2019-05-22 ENCOUNTER — Other Ambulatory Visit: Payer: Self-pay | Admitting: Internal Medicine

## 2019-06-06 ENCOUNTER — Other Ambulatory Visit: Payer: Self-pay | Admitting: Internal Medicine

## 2019-06-11 ENCOUNTER — Other Ambulatory Visit: Payer: Self-pay

## 2019-06-16 ENCOUNTER — Other Ambulatory Visit: Payer: Self-pay

## 2019-06-16 ENCOUNTER — Ambulatory Visit (INDEPENDENT_AMBULATORY_CARE_PROVIDER_SITE_OTHER): Payer: Medicare Other | Admitting: Internal Medicine

## 2019-06-16 ENCOUNTER — Encounter: Payer: Self-pay | Admitting: Internal Medicine

## 2019-06-16 VITALS — BP 120/70 | HR 85 | Ht 63.0 in | Wt 197.0 lb

## 2019-06-16 DIAGNOSIS — Z794 Long term (current) use of insulin: Secondary | ICD-10-CM | POA: Diagnosis not present

## 2019-06-16 DIAGNOSIS — E669 Obesity, unspecified: Secondary | ICD-10-CM | POA: Diagnosis not present

## 2019-06-16 DIAGNOSIS — E118 Type 2 diabetes mellitus with unspecified complications: Secondary | ICD-10-CM | POA: Diagnosis not present

## 2019-06-16 DIAGNOSIS — E785 Hyperlipidemia, unspecified: Secondary | ICD-10-CM | POA: Diagnosis not present

## 2019-06-16 LAB — POCT GLYCOSYLATED HEMOGLOBIN (HGB A1C): Hemoglobin A1C: 6.4 % — AB (ref 4.0–5.6)

## 2019-06-16 LAB — MICROALBUMIN / CREATININE URINE RATIO
Creatinine,U: 141.2 mg/dL
Microalb Creat Ratio: 0.8 mg/g (ref 0.0–30.0)
Microalb, Ur: 1.1 mg/dL (ref 0.0–1.9)

## 2019-06-16 LAB — LIPID PANEL
Cholesterol: 228 mg/dL — ABNORMAL HIGH (ref 0–200)
HDL: 43.5 mg/dL (ref >39.00)
LDL Cholesterol: 150 mg/dL — ABNORMAL HIGH (ref 0–99)
NonHDL: 184.62
Total CHOL/HDL Ratio: 5
Triglycerides: 175 mg/dL — ABNORMAL HIGH (ref 0.0–149.0)
VLDL: 35 mg/dL (ref 0.0–40.0)

## 2019-06-16 NOTE — Patient Instructions (Signed)
Please continue: - Metformin XR 1000 mg 2x a day with meals - Glipizide XL 5 mg before breakfast - Tresiba 10 units daily - Trulicity [id number] mg weekly  Please return in 3-4 months with your sugar log

## 2019-06-16 NOTE — Progress Notes (Signed)
Patient ID: [place], female   DOB: 17-Dec-1955, 63 y.o.   MRN: [id number]  This visit occurred during the SARS-CoV-2 public health emergency.  Safety protocols were in place, including screening questions prior to the visit, additional usage of staff PPE, and extensive cleaning of exam room while observing appropriate contact time as indicated for disinfecting solutions.   HPI: [place] is a 63 y.o.-year-old [place] female,  presenting for f/u for DM2, dx 2000, insulin-dependent, uncontrolled, without long-term complications. Last visit 6 months ago (TELEPHONE).  She continues to have a lot of stress as a caregiver for her husband who is 69 years older than her and has dementia.  Reviewed HbA1c levels: 12/24/2018: HbA1c 7.4% Lab Results  Component Value Date   HGBA1C 7.9 (A) 08/13/2018   HGBA1C 7.0 (A) 04/11/2018   HGBA1C 7.4 10/26/2016  01/23/2018: HbA1c 8.2% 10/09/2017: HbA1c 7.9% 07/06/2017: HbA1c 7.8%  She is on: - Metformin XR 1000 mg 2x a day with meals - Glipizide XL 5 mg before breakfast - Tresiba 10 units daily - Trulicity 6.26 mg weekly - decreased 11/2018 2/2 AP and diarrhea >> she now has the symptoms only occasionally She could not tolerate in the past Trulicity 2/2 AP. Toujeo was ineffective for her. Invokana 100 mg >> had 3 yeast infections since last visit >> we stopped it. She also stopped Cycloset 3 weeks ago (did not get it from pharmacy). She tried Avandia in the past.  She tried Actos in the past.   Pt checks her sugars 1-3 times a day: - am: 101-131, 160 >> 67-140, 156 >> 80-100 >> 97-115 - 2h after b'fast: 66, 121-176, 206 >> 130-150 >> 130-160  - before lunch: 149-189, 256 >> 120-170 (fruit) >> 130-140 (grape) - 2h after lunch: n/c >> 154-229 >> 155-160 >> 150-155 - before dinner: n/c >> 82-154 >> 170 (snack) >> 140-150 - 2h after dinner: 57, 63 x1 >> 169-207 >> 170  >> 150-160 - nighttime:  128-163, 263  >> 125-201 >> n/c Lowest: 57 x1, 63  x1 after dinner >> 67 >> 80 >> 97. She has hgly awareness at 18.  Meter: AccuChek Smart View  Pt's meals are: - Breakfast: coffee + waffle + PB; blueberry - Lunch: chicken noodle soup, stews - Dinner: fried chicken - Snack: 1  Reviewed labs from 10/09/2017: - ACR <7.9 - Glucose 139, BUN/creatinine 21/0.76, GFR 77, otherwise normal CMP  -No CKD, Prev. BUN/creatinine:  Lab Results  Component Value Date   BUN 18 01/28/2016   CREATININE 0.81 01/28/2016  24 h urine protein 85 (<150) Not on an ACE inhibitor/ARB.  -+ HL; latest lipid panel: Lab Results  Component Value Date   CHOL 168 11/28/2017   HDL 47.40 11/28/2017   LDLCALC 86 11/28/2017   LDLDIRECT 167.2 08/22/2010   TRIG 174.0 (H) 11/28/2017   CHOLHDL 4 11/28/2017  On Crestor 30 and fenofibrate.  - last eye exam was on 11/2018: No DR  -No numbness and tingling in her feet.    On ASA 81.  She also has a history of HTN, fatty liver.  Her first husband died when she was 61 and he was 67 in a motor vehicle accident in [place].  She moved to [place] afterwards with 1 of her sons.  Her older son is still in [place].  She would like to move back to [place], but her husband does not want to move.  Her younger son will move to [place] today.  He and  his family was helping them, but now she is left without a car and she is quite stressed about it.    ROS: Constitutional: no weight gain/no weight loss, no fatigue, no subjective hyperthermia, no subjective hypothermia Eyes: no blurry vision, no xerophthalmia ENT: no sore throat, no nodules palpated in neck, no dysphagia, no odynophagia, no hoarseness Cardiovascular: no CP/no SOB/no palpitations/no leg swelling Respiratory: no cough/no SOB/no wheezing Gastrointestinal: no N/no V/no D/no C/no acid reflux Musculoskeletal: no muscle aches/+ joint aches Skin: no rashes, no hair loss Neurological: no tremors/no numbness/no tingling/no dizziness  I reviewed pt's medications,  allergies, PMH, social hx, family hx, and changes were documented in the history of present illness. Otherwise, unchanged from my initial visit note.  Past Medical History:  Diagnosis Date  . Arthritis   . CTS (carpal tunnel syndrome)    6/15-19/09 RLL PNA with sepsis high LFTs (ALT 61)  . Depression   . Diabetes mellitus   . GERD (gastroesophageal reflux disease)   . Hyperlipidemia   . Hypertension   . Right upper quadrant abdominal pain    Past Surgical History:  Procedure Laterality Date  . EYE SURGERY     Social History   Socioeconomic History  . Marital status: Married    Spouse name: Not on file  . Number of children: 2  . Years of education: Not on file  . Highest education level: Not on file  Occupational History  . Occupation: Retired  Scientific laboratory technician  . Financial resource strain: Not on file  . Food insecurity    Worry: Not on file    Inability: Not on file  . Transportation needs    Medical: Not on file    Non-medical: Not on file  Tobacco Use  . Smoking status: Never Smoker  . Smokeless tobacco: Never Used  Substance and Sexual Activity  . Alcohol use: No  . Drug use: No  . Sexual activity: Not on file  Lifestyle  . Physical activity    Days per week: Not on file    Minutes per session: Not on file  . Stress: Not on file  Relationships  . Social Herbalist on phone: Not on file    Gets together: Not on file    Attends religious service: Not on file    Active member of club or organization: Not on file    Attends meetings of clubs or organizations: Not on file    Relationship status: Not on file  . Intimate partner violence    Fear of current or ex partner: Not on file    Emotionally abused: Not on file    Physically abused: Not on file    Forced sexual activity: Not on file  Other Topics Concern  . Not on file  Social History Narrative   No diet    Regular exercise-no   Originally from [place]   Current Outpatient Medications on  File Prior to Visit  Medication Sig Dispense Refill  . ACCU-CHEK FASTCLIX LANCETS MISC Check blood sugar twice daily 200 each 5  . ACCU-CHEK SMARTVIEW test strip CHECK BLOOD SUGAR FOUR TIMES DAILY 360 strip 11  . Alcohol Swabs (B-D SINGLE USE SWABS REGULAR) PADS Check blood sugar twice daily 200 each 3  . aspirin 81 MG tablet Take 81 mg by mouth daily.      . BD PEN NEEDLE NANO U/F 32G X 4 MM MISC USE ONCE DAILY AS DIRECTED 100 each 0  .  betamethasone dipropionate (DIPROLENE) 0.05 % cream Apply topically 2 (two) times daily. 30 g 0  . Blood Glucose Monitoring Suppl (ACCU-CHEK NANO SMARTVIEW) w/Device KIT Check blood sugar as directed 1 kit 0  . esomeprazole (NEXIUM) 40 MG capsule TAKE 1 CAPSULE BY MOUTH EVERY DAY BEFORE BREAKFAST 90 capsule 1  . glipiZIDE (GLUCOTROL XL) 5 MG 24 hr tablet TAKE 1 TABLET(5 MG) BY MOUTH DAILY WITH BREAKFAST 90 tablet 3  . Insulin Degludec (TRESIBA FLEXTOUCH) 200 UNIT/ML SOPN Inject 10 Units into the skin daily before breakfast. 9 pen 3  . Insulin Pen Needle (BD PEN NEEDLE NANO U/F) 32G X 4 MM MISC USE ONCE DAILY AS DIRECTED 100 each 3  . metFORMIN (GLUCOPHAGE-XR) 500 MG 24 hr tablet TAKE 4 TABLETS DAILY WITH  SUPPER 360 tablet 3  . rosuvastatin (CRESTOR) 20 MG tablet TAKE 1 AND 1/2 TABLETS BY MOUTH DAILY 135 tablet 0  . sertraline (ZOLOFT) 100 MG tablet Take 2 tablets (200 mg total) by mouth daily. 180 tablet 1  . TRULICITY 2.83 TD/1.7OH SOPN ADMINISTER 0.5 MG UNDER THE SKIN WEEKLY IN THE MORNING 2 mL 1   No current facility-administered medications on file prior to visit.    Allergies  Allergen Reactions  . Trulicity [Dulaglutide] Other (See Comments)    AP   Family History  Problem Relation Age of Onset  . Arthritis Father   . Cancer Father   . Diabetes Mother   . Heart disease Mother   . Breast cancer Neg Hx    PE: BP 120/70   Pulse 85   Ht 5' 3" (1.6 m)   Wt 197 lb (89.4 kg)   SpO2 96%   BMI 34.90 kg/m  Wt Readings from Last 3 Encounters:   06/16/19 197 lb (89.4 kg)  08/13/18 212 lb (96.2 kg)  04/11/18 204 lb (92.5 kg)   Constitutional: overweight, in NAD Eyes: PERRLA, EOMI, no exophthalmos ENT: moist mucous membranes, no thyromegaly, no cervical lymphadenopathy Cardiovascular: RRR, No MRG Respiratory: CTA B Gastrointestinal: abdomen soft, NT, ND, BS+ Musculoskeletal: no deformities, strength intact in all 4 Skin: moist, warm, no rashes Neurological: no tremor with outstretched hands, DTR normal in all 4  ASSESSMENT: 1. DM2, insulin-dependent, uncontrolled, without long term complications, but with hyperglycemia  2.  Hyperlipidemia  3. Obesity  PLAN:  1. Patient with longstanding, uncontrolled, 2 diabetes, on basal insulin, oral medications and weekly GLP-1 receptor agonist, decreased at last visit due to abdominal pain diarrhea.  She was previously on Onglyza and then tried Trulicity.  She initially had GI symptoms and had to stop but earlier in the year she wanted to retry the Trulicity.  We can only use it at a low dose due to return of GI symptoms.  However, since last visit, she only has occasional GI symptoms with it and would like to continue. -Latest HbA1c checked by PCP was reviewed with the patient and this was lower, at 7.4% in 12/2018. -At this visit, sugars are stable, and mostly at goal.  Her sugars before meals are slightly higher than goal occasionally as she is eating fruit between meals.  However, overall, they are controlled, and no changes needed in her medication. - I advised her to: Patient Instructions  Please continue: - Metformin XR 1000 mg 2x a day with meals - Glipizide XL 5 mg before breakfast - Tresiba 10 units daily - Trulicity 6.07 mg weekly  Please return in 3-4 months with your sugar log  - we checked  her HbA1c: 6.4% (improved) - advised to check sugars at different times of the day - 1x a day, rotating check times - advised for yearly eye exams >> she is UTD - check annual labs  today - return to clinic in 3-4 months      2. HL -Reviewed latest lipid panel from 2019: LDL at goal, triglycerides slightly high: Lab Results  Component Value Date   CHOL 168 11/28/2017   HDL 47.40 11/28/2017   LDLCALC 86 11/28/2017   LDLDIRECT 167.2 08/22/2010   TRIG 174.0 (H) 11/28/2017   CHOLHDL 4 11/28/2017  -Continues Crestor and fenofibrate without side effects -She is due for another lipid panel >> we will check today as she is fasting  3. Obesity  -Continue Trulicity which should also help with weight loss, but we have to decrease the dose at last visit due to abdominal pain and diarrhea -At this visit, she lost 15 lbs since 07/2018!   Component     Latest Ref Rng & Units 06/16/2019  Glucose     65 - 99 mg/dL 105 (H)  BUN     7 - 25 mg/dL 16  Creatinine     0.50 - 0.99 mg/dL 0.88  GFR, Est Non African American     > OR = 60 mL/min/1.41m 69  GFR, Est African American     > OR = 60 mL/min/1.710m80  BUN/Creatinine Ratio     6 - 22 (calc) NOT APPLICABLE  Sodium     13342 146 mmol/L 138  Potassium     3.5 - 5.3 mmol/L 4.6  Chloride     98 - 110 mmol/L 105  CO2     20 - 32 mmol/L 27  Calcium     8.6 - 10.4 mg/dL 9.3  Total Protein     6.1 - 8.1 g/dL 6.9  Albumin MSPROF     3.6 - 5.1 g/dL 4.1  Globulin     1.9 - 3.7 g/dL (calc) 2.8  AG Ratio     1.0 - 2.5 (calc) 1.5  Total Bilirubin     0.2 - 1.2 mg/dL 0.4  Alkaline phosphatase (APISO)     37 - 153 U/L 81  AST     10 - 35 U/L 13  ALT     6 - 29 U/L 13  Cholesterol     0 - 200 mg/dL 228 (H)  Triglycerides     0.0 - 149.0 mg/dL 175.0 (H)  HDL Cholesterol     >39.00 mg/dL 43.50  VLDL     0.0 - 40.0 mg/dL 35.0  LDL (calc)     0 - 99 mg/dL 150 (H)  Total CHOL/HDL Ratio      5  NonHDL      184.62  Microalb, Ur     0.0 - 1.9 mg/dL 1.1  Creatinine,U     mg/dL 141.2  MICROALB/CREAT RATIO     0.0 - 30.0 mg/g 0.8   ACR normal.  Kidney function slightly lower.  LDL above target, higher than  before.  Triglycerides slightly high,  Stable.  I will check with her if she is taking Crestor consistently.  If so, we may need to increase Crestor to 40 mg daily.  CrPhilemon KingdomMD PhD [facility]

## 2019-06-17 LAB — COMPLETE METABOLIC PANEL WITH GFR
AG Ratio: 1.5 (calc) (ref 1.0–2.5)
ALT: 13 U/L (ref 6–29)
AST: 13 U/L (ref 10–35)
Albumin: 4.1 g/dL (ref 3.6–5.1)
Alkaline phosphatase (APISO): 81 U/L (ref 37–153)
BUN: 16 mg/dL (ref 7–25)
CO2: 27 mmol/L (ref 20–32)
Calcium: 9.3 mg/dL (ref 8.6–10.4)
Chloride: 105 mmol/L (ref 98–110)
Creat: 0.88 mg/dL (ref 0.50–0.99)
GFR, Est African American: 80 mL/min/{1.73_m2} (ref >=60)
GFR, Est Non African American: 69 mL/min/{1.73_m2} (ref >=60)
Globulin: 2.8 g/dL (calc) (ref 1.9–3.7)
Glucose, Bld: 105 mg/dL — ABNORMAL HIGH (ref 65–99)
Potassium: 4.6 mmol/L (ref 3.5–5.3)
Sodium: 138 mmol/L (ref 135–146)
Total Bilirubin: 0.4 mg/dL (ref 0.2–1.2)
Total Protein: 6.9 g/dL (ref 6.1–8.1)

## 2019-06-18 ENCOUNTER — Telehealth: Payer: Self-pay

## 2019-06-18 NOTE — Telephone Encounter (Signed)
Spoke to patient and read the message, she states she only takes 20 MG and does not take it very often because she thought her cholesterol was ok.  Please advise if you want her to do the 20 or 30 and she will start taking it daily.

## 2019-06-18 NOTE — Telephone Encounter (Signed)
-----   Message from Philemon Kingdom, MD sent at 06/18/2019 12:17 PM EST ----- Lenna Sciara, can you please call pt: Urine proteins are normal.  Kidney function slightly lower but still above 60, which is good.  LDL (bad cholesterol) above target, higher than before.  Please check with her if she is taking the Crestor consistently.  If so, is she on 30 mg daily?  We need to increase to 40 mg daily if she is taking this consistently.  Her triglycerides are slightly high, but stable.

## 2019-06-18 NOTE — Telephone Encounter (Signed)
On her medlist was mentioned 1.5 tablets daily... First let's have her take the 20 mg daily and will recheck at next visit.

## 2019-06-20 NOTE — Telephone Encounter (Signed)
Left message for patient to return our call at [phone].  

## 2019-07-15 ENCOUNTER — Telehealth: Payer: Self-pay

## 2019-07-15 NOTE — Telephone Encounter (Signed)
Fax [phone]  Called in needing most recent A1C if not wanting to send to her you can send to patients PCP

## 2019-07-17 NOTE — Telephone Encounter (Signed)
A1c printed and will be faxed.

## 2019-07-30 ENCOUNTER — Other Ambulatory Visit: Payer: Self-pay | Admitting: Internal Medicine

## 2019-08-08 ENCOUNTER — Telehealth: Payer: Self-pay

## 2019-08-08 MED ORDER — GLUCOPHAGE XR 500 MG PO TB24: ORAL_TABLET | ORAL | 2 refills | Status: DC

## 2019-08-08 NOTE — Telephone Encounter (Signed)
RX sent as per Dr. Cruzita Lederer.

## 2019-08-08 NOTE — Telephone Encounter (Signed)
I believe this intended for you and Dr. Cruzita Lederer

## 2019-08-08 NOTE — Addendum Note (Signed)
Addended by: Cardell Peach I on: 08/08/2019 04:45 PM   Modules accepted: Orders

## 2019-08-08 NOTE — Telephone Encounter (Signed)
I am not sure what to make of this.  She previously had generic Metformin ER without problems.  We can try to send Glucophage ER DAW.Marland KitchenMarland Kitchen

## 2019-08-08 NOTE — Telephone Encounter (Signed)
Patient calling today stating [facility] sent her the generic for metformin and it is making her blood sugar run high and she was told by [facility] that if she does not want generic she will have to get the MD to state that on Rx-please contact patient to further discuss

## 2019-08-25 ENCOUNTER — Telehealth: Payer: Self-pay

## 2019-08-25 NOTE — Telephone Encounter (Signed)
PA is not need for Trulicity.

## 2019-09-15 ENCOUNTER — Other Ambulatory Visit: Payer: Self-pay | Admitting: Internal Medicine

## 2019-10-01 ENCOUNTER — Other Ambulatory Visit: Payer: Self-pay

## 2019-10-01 MED ORDER — METFORMIN HCL ER (OSM) 500 MG PO TB24: 500.0000 mg | ORAL_TABLET | Freq: Every day | ORAL | 1 refills | Status: DC

## 2019-10-01 NOTE — Telephone Encounter (Signed)
Glucophage XR TAB 500 MG has been DC by manufacturer and no longer available.  Dr. Cruzita Lederer would like to replace it with Metformin ER TAB 500 MG.  This has been sent to her pharmacy.

## 2019-10-10 ENCOUNTER — Other Ambulatory Visit: Payer: Self-pay

## 2019-10-14 ENCOUNTER — Encounter: Payer: Self-pay | Admitting: Internal Medicine

## 2019-10-14 ENCOUNTER — Other Ambulatory Visit: Payer: Self-pay

## 2019-10-14 ENCOUNTER — Ambulatory Visit (INDEPENDENT_AMBULATORY_CARE_PROVIDER_SITE_OTHER): Payer: Medicare Other | Admitting: Internal Medicine

## 2019-10-14 VITALS — BP 138/80 | HR 82 | Ht 63.0 in | Wt 202.0 lb

## 2019-10-14 DIAGNOSIS — E785 Hyperlipidemia, unspecified: Secondary | ICD-10-CM | POA: Diagnosis not present

## 2019-10-14 DIAGNOSIS — Z794 Long term (current) use of insulin: Secondary | ICD-10-CM | POA: Diagnosis not present

## 2019-10-14 DIAGNOSIS — E669 Obesity, unspecified: Secondary | ICD-10-CM

## 2019-10-14 DIAGNOSIS — E118 Type 2 diabetes mellitus with unspecified complications: Secondary | ICD-10-CM | POA: Diagnosis not present

## 2019-10-14 LAB — POCT GLYCOSYLATED HEMOGLOBIN (HGB A1C): Hemoglobin A1C: 8.1 % — AB (ref 4.0–5.6)

## 2019-10-14 NOTE — Progress Notes (Signed)
Patient ID: Donna Pham, female   DOB: 1955/08/31, 64 y.o.   MRN: [id number]  This visit occurred during the SARS-CoV-2 public health emergency.  Safety protocols were in place, including screening questions prior to the visit, additional usage of staff PPE, and extensive cleaning of exam room while observing appropriate contact time as indicated for disinfecting solutions.   HPI: Donna Pham is a 64 y.o.-year-old [place] female,  presenting for f/u for DM2, dx 2000, insulin-dependent, uncontrolled, without long-term complications. Last visit 4 months ago.  She relaxed her diet since last office visit >> eats a lot of sweets. Sugars are higher.  She continues to have a lot of stress as a caregiver for her husband who is 9 years older than her and has dementia.  Reviewed HbA1c levels: Lab Results  Component Value Date   HGBA1C 6.4 (A) 06/16/2019   HGBA1C 7.9 (A) 08/13/2018   HGBA1C 7.0 (A) 04/11/2018  12/24/2018: HbA1c 7.4% 01/23/2018: HbA1c 8.2% 10/09/2017: HbA1c 7.9% 07/06/2017: HbA1c 7.8%  She is on: - Metformin XR 1000 mg 2x a day with meals - Glipizide XL 5 mg before breakfast - Tresiba 10 units daily - Trulicity 6.80 mg weekly - decreased 11/2018 2/2 AP and diarrhea >> now resolved She could not tolerate in the past Trulicity 2/2 AP. Toujeo was ineffective for her. Invokana 100 mg >> had 3 yeast infections since last visit >> we stopped it. She also stopped Cycloset 3 weeks ago (did not get it from pharmacy). She tried Avandia in the past.  She tried Actos in the past.   Pt checks her sugars 1 time a day: - am: 67-140, 156 >> 80-100 >> 97-115 >> 119, 121-156, 162 - 2h after b'fast: 130-150 >> 130-160 >> n/c - before lunch: 120-170 (fruit) >> 130-140 (grape) >> n/c - 2h after lunch: 155-160 >> 150-155 >> n/c - before dinner:  170 (snack) >> 140-150 >> n/c - 2h after dinner: 169-207 >> 170  >> 150-160 >> 252, 278 - nighttime:  128-163, 263  >> 125-201 >> n/c Lowest:  57 x1, 63 x1 after dinner >> 67 >> 80 >> 97. She has hgly awareness in the 64s.  Meter: AccuChek Smart View  Pt's meals are: - Breakfast: coffee + waffle + PB; blueberry - Lunch: chicken noodle soup, stews - Dinner: fried chicken - Snacks: many  Reviewed labs from 10/09/2017: - ACR <7.9 - Glucose 139, BUN/creatinine 21/0.76, GFR 77, otherwise normal CMP  -No CKD, Prev. BUN/creatinine:  Lab Results  Component Value Date   BUN 16 06/16/2019   CREATININE 0.88 06/16/2019  24 h urine protein 85 (<150) She is not on ACE inhibitor/ARB.  -+ HL; latest lipid panel: Lab Results  Component Value Date   CHOL 228 (H) 06/16/2019   HDL 43.50 06/16/2019   LDLCALC 150 (H) 06/16/2019   LDLDIRECT 167.2 08/22/2010   TRIG 175.0 (H) 06/16/2019   CHOLHDL 5 06/16/2019  On Crestor and fenofibric.  - last eye exam was on 11/2018: No DR  -No numbness and tingling in her feet.    On ASA 81.  She also has a history of HTN, fatty liver.  Her first husband died when she was 62 and he was 98 in a motor vehicle accident in [place].  She moved to [place] afterwards with 1 of her sons.  Her older son is still in [place].  She would like to move back to [place], but her husband does not want to move.  Her younger  son will move to [place] today.  He and his family was helping them, but now she is left without a car and she is quite stressed about it.    ROS: Constitutional: no weight gain/no weight loss, no fatigue, no subjective hyperthermia, no subjective hypothermia Eyes: no blurry vision, no xerophthalmia ENT: no sore throat, no nodules palpated in neck, no dysphagia, no odynophagia, no hoarseness Cardiovascular: no CP/no SOB/no palpitations/no leg swelling Respiratory: no cough/no SOB/no wheezing Gastrointestinal: no N/no V/no D/no C/no acid reflux Musculoskeletal: no muscle aches/no joint aches Skin: no rashes, no hair loss Neurological: no tremors/no numbness/no tingling/no dizziness  I  reviewed pt's medications, allergies, PMH, social hx, family hx, and changes were documented in the history of present illness. Otherwise, unchanged from my initial visit note.  Past Medical History:  Diagnosis Date  . Arthritis   . CTS (carpal tunnel syndrome)    6/15-19/09 RLL PNA with sepsis high LFTs (ALT 61)  . Depression   . Diabetes mellitus   . GERD (gastroesophageal reflux disease)   . Hyperlipidemia   . Hypertension   . Right upper quadrant abdominal pain    Past Surgical History:  Procedure Laterality Date  . EYE SURGERY     Social History   Socioeconomic History  . Marital status: Married    Spouse name: Not on file  . Number of children: 2  . Years of education: Not on file  . Highest education level: Not on file  Occupational History  . Occupation: Retired  Tobacco Use  . Smoking status: Never Smoker  . Smokeless tobacco: Never Used  Substance and Sexual Activity  . Alcohol use: No  . Drug use: No  . Sexual activity: Not on file  Other Topics Concern  . Not on file  Social History Narrative   No diet    Regular exercise-no   Originally from [place]   Social Determinants of Health   Financial Resource Strain:   . Difficulty of Paying Living Expenses:   Food Insecurity:   . Worried About Charity fundraiser in the Last Year:   . Arboriculturist in the Last Year:   Transportation Needs:   . Film/video editor (Medical):   Marland Kitchen Lack of Transportation (Non-Medical):   Physical Activity:   . Days of Exercise per Week:   . Minutes of Exercise per Session:   Stress:   . Feeling of Stress :   Social Connections:   . Frequency of Communication with Friends and Family:   . Frequency of Social Gatherings with Friends and Family:   . Attends Religious Services:   . Active Member of Clubs or Organizations:   . Attends Archivist Meetings:   Marland Kitchen Marital Status:   Intimate Partner Violence:   . Fear of Current or Ex-Partner:   . Emotionally  Abused:   Marland Kitchen Physically Abused:   . Sexually Abused:    Current Outpatient Medications on File Prior to Visit  Medication Sig Dispense Refill  . ACCU-CHEK FASTCLIX LANCETS MISC Check blood sugar twice daily 200 each 5  . ACCU-CHEK SMARTVIEW test strip CHECK BLOOD SUGAR FOUR TIMES DAILY 360 strip 11  . Alcohol Swabs (B-D SINGLE USE SWABS REGULAR) PADS Check blood sugar twice daily 200 each 3  . aspirin 81 MG tablet Take 81 mg by mouth daily.      . BD PEN NEEDLE NANO U/F 32G X 4 MM MISC USE ONCE DAILY AS DIRECTED 100 each  0  . betamethasone dipropionate (DIPROLENE) 0.05 % cream Apply topically 2 (two) times daily. 30 g 0  . Blood Glucose Monitoring Suppl (ACCU-CHEK NANO SMARTVIEW) w/Device KIT Check blood sugar as directed 1 kit 0  . esomeprazole (NEXIUM) 40 MG capsule TAKE 1 CAPSULE BY MOUTH EVERY DAY BEFORE BREAKFAST 90 capsule 1  . glipiZIDE (GLUCOTROL XL) 5 MG 24 hr tablet TAKE 1 TABLET(5 MG) BY MOUTH DAILY WITH BREAKFAST 90 tablet 3  . Insulin Pen Needle (BD PEN NEEDLE NANO U/F) 32G X 4 MM MISC USE ONCE DAILY AS DIRECTED 100 each 3  . metformin (FORTAMET) 500 MG (OSM) 24 hr tablet Take 1 tablet (500 mg total) by mouth daily with breakfast. 90 tablet 1  . rosuvastatin (CRESTOR) 20 MG tablet TAKE 1 AND 1/2 TABLETS BY MOUTH DAILY 135 tablet 0  . sertraline (ZOLOFT) 100 MG tablet Take 2 tablets (200 mg total) by mouth daily. 180 tablet 1  . TRESIBA FLEXTOUCH 200 UNIT/ML SOPN INJECT 16 UNITS INTO THE SKIN DAILY BEFORE BREAKFAST 27 mL 1  . TRULICITY 0.10 UV/2.5DG SOPN ADMINISTER 0.5 MG UNDER THE SKIN WEEKLY IN THE MORNING 12 pen 1   No current facility-administered medications on file prior to visit.   Allergies  Allergen Reactions  . Trulicity [Dulaglutide] Other (See Comments)    AP   Family History  Problem Relation Age of Onset  . Arthritis Father   . Cancer Father   . Diabetes Mother   . Heart disease Mother   . Breast cancer Neg Hx    PE: BP 138/80   Pulse 82   Ht '5\' 3"'$   (1.6 m)   Wt 202 lb (91.6 kg)   SpO2 98%   BMI 35.78 kg/m  Wt Readings from Last 3 Encounters:  10/14/19 202 lb (91.6 kg)  06/16/19 197 lb (89.4 kg)  08/13/18 212 lb (96.2 kg)   Constitutional: overweight, in NAD Eyes: PERRLA, EOMI, no exophthalmos ENT: moist mucous membranes, no thyromegaly, no cervical lymphadenopathy Cardiovascular: RRR, No MRG Respiratory: CTA B Gastrointestinal: abdomen soft, NT, ND, BS+ Musculoskeletal: no deformities, strength intact in all 4 Skin: moist, warm, no rashes Neurological: no tremor with outstretched hands, DTR normal in all 4  ASSESSMENT: 1. DM2, insulin-dependent, uncontrolled, without long term complications, but with hyperglycemia  2.  Hyperlipidemia  3. Obesity  PLAN:  1. Patient with longstanding, uncontrolled, type 2 diabetes, on basal insulin, weekly GLP-1 receptor agonist and also Metformin and sulfonylurea, with improved blood sugars and HbA1c at last visit (6.4%).  At that time, sugars before meals were slightly higher than goal occasionally as she was eating between meals.  Overall, they were controlled and we did not change her medications. - She had abdominal pain and diarrhea with a higher dose of Trulicity so we are using a lower dose, 0.75 mg weekly. -At this visit, the sugars are higher, as she is eating more sweets.  She tells me that she thinks about eating sweets since she wakes up in the morning.  We discussed about trying to stay away from processed foods or other sweets except fruit for 3 to 4 weeks to reset her taste buds.  I also advised her to eliminate juice and also diet sodas. -In the meantime, since sugars after dinner are quite high, will add glipizide IR before this meal.  I also advised her to increase the Trulicity to 1.5 mg weekly since she now tolerates well the 0.75 mg dose - I advised her to:  Patient Instructions  Please continue: - Metformin XR 1000 mg 2x a day with meals - Tresiba 10 units  daily  Increase: - Glipizide XL to 5 mg before b'fast and 5 mg before dinner (crushed)  - Trulicity to 1.5 mg weekly  STOP juice. Try to stop diet sodas.  Please return in 3-4 months with your sugar log  - we checked her HbA1c: 8.1% (higher) - advised to check sugars at different times of the day - 1x a day, rotating check times - advised for yearly eye exams >> she is UTD - return to clinic in 3-4 months      2. HL -We checked her lipid panel at last visit and LDL was quite high, triglycerides also above target Lab Results  Component Value Date   CHOL 228 (H) 06/16/2019   HDL 43.50 06/16/2019   LDLCALC 150 (H) 06/16/2019   LDLDIRECT 167.2 08/22/2010   TRIG 175.0 (H) 06/16/2019   CHOLHDL 5 06/16/2019  -Continues Crestor and fenofibrate without side effects  3. Obesity  -Continue Trulicity at the lower dose, due to abdominal pain and diarrhea -At last visit, she lost 15 pounds in the last year, now she gained 5 pounds since last visit   Philemon Kingdom, MD PhD [facility] Endocrinology

## 2019-10-14 NOTE — Patient Instructions (Addendum)
Please continue: - Metformin XR 1000 mg 2x a day with meals - Tresiba 10 units daily  Increase: - Glipizide XL to 5 mg before b'fast and 5 mg before dinner (crushed)  - Trulicity to 1.5 mg weekly  STOP juice. Try to stop diet sodas.  Please return in 3-4 months with your sugar log

## 2019-11-28 ENCOUNTER — Telehealth: Payer: Self-pay | Admitting: Internal Medicine

## 2019-11-28 MED ORDER — TRULICITY 1.5 MG/0.5ML ~~LOC~~ SOAJ: 1.5000 mg | SUBCUTANEOUS | 1 refills | Status: DC

## 2019-11-28 MED ORDER — BD PEN NEEDLE NANO U/F 32G X 4 MM MISC: 3 refills | Status: DC

## 2019-11-28 NOTE — Telephone Encounter (Signed)
Medication Refill Request  Did you call your pharmacy and request this refill first? Yes  . If patient has not contacted pharmacy first, instruct them to do so for future refills.  . Remind them that contacting the pharmacy for their refill is the quickest method to get the refill.  . Refill policy also stated that it will take anywhere between 24-72 hours to receive the refill.    Name of medication? accu-chek pen needles AND trulicity 1.5  Is this a 90 day supply? yes  Name and location of pharmacy?   [facility] [id number]  [place], [place] [address]  Phone:  [phone] Fax:  [phone]

## 2019-11-28 NOTE — Telephone Encounter (Signed)
RX sent

## 2019-12-03 ENCOUNTER — Telehealth: Payer: Self-pay | Admitting: Internal Medicine

## 2019-12-04 ENCOUNTER — Telehealth: Payer: Self-pay | Admitting: Internal Medicine

## 2019-12-04 NOTE — Telephone Encounter (Signed)
Pharmacy updated.

## 2019-12-04 NOTE — Telephone Encounter (Signed)
Patient called to request that all of her prescriptions be switched to [facility]

## 2019-12-04 NOTE — Telephone Encounter (Signed)
Last chart note with Dr. Cruzita Lederer  10/14/2019 reviewed:   Increase: - Glipizide XL to 5 mg before b'fast and 5 mg before dinner (crushed)

## 2019-12-04 NOTE — Telephone Encounter (Signed)
Denton Ar with [facility] requests to be called at ph# [phone] re: clarification of dosage and frequency for Glipizide XL

## 2019-12-04 NOTE — Telephone Encounter (Signed)
error 

## 2019-12-04 NOTE — Telephone Encounter (Signed)
This note has been faxed to PCP for clarification.

## 2019-12-08 ENCOUNTER — Other Ambulatory Visit: Payer: Self-pay

## 2019-12-08 ENCOUNTER — Telehealth: Payer: Self-pay | Admitting: Internal Medicine

## 2019-12-08 DIAGNOSIS — E118 Type 2 diabetes mellitus with unspecified complications: Secondary | ICD-10-CM

## 2019-12-08 MED ORDER — GLIPIZIDE ER 5 MG PO TB24: ORAL_TABLET | ORAL | 0 refills | Status: DC

## 2019-12-08 NOTE — Telephone Encounter (Signed)
Medication Refill Request  Did you call your pharmacy and request this refill first? Yes  . If patient has not contacted pharmacy first, instruct them to do so for future refills.  . Remind them that contacting the pharmacy for their refill is the quickest method to get the refill.  . Refill policy also stated that it will take anywhere between 24-72 hours to receive the refill.    Name of medication? Glipizide and accu-chek lancets  Is this a 90 day supply? yes  Name and location of pharmacy?  [facility] - [place], [place] - [address] Phone:  [phone]  Fax:  [phone]

## 2020-01-23 ENCOUNTER — Other Ambulatory Visit: Payer: Self-pay | Admitting: Family Medicine

## 2020-01-23 DIAGNOSIS — R1011 Right upper quadrant pain: Secondary | ICD-10-CM

## 2020-01-23 LAB — HEMOGLOBIN A1C: Hemoglobin A1C: 8.2

## 2020-01-26 ENCOUNTER — Encounter: Payer: Self-pay | Admitting: Internal Medicine

## 2020-01-26 NOTE — Progress Notes (Signed)
Received fax result from PCP.  Result abstracted.

## 2020-01-28 ENCOUNTER — Other Ambulatory Visit: Payer: Self-pay | Admitting: Internal Medicine

## 2020-01-28 DIAGNOSIS — Z794 Long term (current) use of insulin: Secondary | ICD-10-CM

## 2020-01-28 DIAGNOSIS — E118 Type 2 diabetes mellitus with unspecified complications: Secondary | ICD-10-CM

## 2020-02-02 ENCOUNTER — Ambulatory Visit
Admission: RE | Admit: 2020-02-02 | Discharge: 2020-02-02 | Disposition: A | Discharge status: Home / Self Care | Payer: Medicare Other | Source: Ambulatory Visit | Attending: Family Medicine | Admitting: Family Medicine

## 2020-02-02 DIAGNOSIS — R1011 Right upper quadrant pain: Secondary | ICD-10-CM

## 2020-02-23 ENCOUNTER — Telehealth: Payer: Self-pay | Admitting: Internal Medicine

## 2020-02-23 NOTE — Telephone Encounter (Signed)
Unfortunately we do not have a copy of her eye exam. We have asked her to provide [place] the information but she has not given it to [place].

## 2020-02-23 NOTE — Telephone Encounter (Signed)
[facility] calling to request for [place] to send them the patient's last eye exam (May 2020) - fax# [phone]

## 2020-03-04 ENCOUNTER — Ambulatory Visit: Payer: Medicare Other | Admitting: Internal Medicine

## 2020-03-15 ENCOUNTER — Other Ambulatory Visit: Payer: Self-pay

## 2020-03-15 DIAGNOSIS — E118 Type 2 diabetes mellitus with unspecified complications: Secondary | ICD-10-CM

## 2020-03-15 MED ORDER — GLIPIZIDE ER 5 MG PO TB24: ORAL_TABLET | ORAL | 1 refills | Status: DC

## 2020-04-06 ENCOUNTER — Telehealth: Payer: Self-pay

## 2020-04-06 MED ORDER — METFORMIN HCL ER (OSM) 500 MG PO TB24: 500.0000 mg | ORAL_TABLET | Freq: Two times a day (BID) | ORAL | 0 refills | Status: DC

## 2020-04-06 MED ORDER — METFORMIN HCL ER (OSM) 500 MG PO TB24: 500.0000 mg | ORAL_TABLET | Freq: Every day | ORAL | 0 refills | Status: DC

## 2020-04-06 NOTE — Telephone Encounter (Signed)
[facility]

## 2020-04-06 NOTE — Telephone Encounter (Signed)
New message  [facility] calling   1. Which medications need to be rewritten to say metformin (FORTAMET) 500 MG (OSM) 24 hr tablet twice daily   2. Which pharmacy/location (including street and city if local pharmacy) is medication to be sent to?[facility] - [place], [place] - [address]

## 2020-04-06 NOTE — Telephone Encounter (Signed)
New message    Donna Pham is saying metformin (FORTAMET) 500 MG (OSM) 24 hr tablet needs to be rewritten to say twice daily.    Old prescription  metformin (FORTAMET) 500 MG (OSM) 24 hr tablet Take 1 tablet (500 mg total) by mouth daily with breakfast.,

## 2020-04-06 NOTE — Telephone Encounter (Signed)
OK.  I thought she was on a higher dose before, please clarify with her first.

## 2020-04-06 NOTE — Telephone Encounter (Signed)
RX changed and sent.

## 2020-04-24 ENCOUNTER — Other Ambulatory Visit: Payer: Self-pay | Admitting: Internal Medicine

## 2020-04-26 ENCOUNTER — Other Ambulatory Visit: Payer: Self-pay | Admitting: Family Medicine

## 2020-04-26 DIAGNOSIS — Z1231 Encounter for screening mammogram for malignant neoplasm of breast: Secondary | ICD-10-CM

## 2020-04-27 ENCOUNTER — Other Ambulatory Visit: Payer: Self-pay | Admitting: Internal Medicine

## 2020-04-27 MED ORDER — METFORMIN HCL ER (OSM) 500 MG PO TB24
500.0000 mg | ORAL_TABLET | Freq: Two times a day (BID) | ORAL | 0 refills | Status: DC
Start: 2020-04-27 — End: 2020-07-21

## 2020-04-27 MED ORDER — TRULICITY 1.5 MG/0.5ML ~~LOC~~ SOAJ: 1.5000 mg | SUBCUTANEOUS | 0 refills | Status: DC

## 2020-04-27 NOTE — Telephone Encounter (Signed)
Medication Refill Request   Did you call your pharmacy and request this refill first?unknown    If patient has not contacted pharmacy first, instruct them to do so for future refills.   Remind them that contacting the pharmacy for their refill is the quickest method to get the refill.   Refill policy also stated that it will take anywhere between 24-72 hours to receive the refill.    Name of medication? Trulicity & Metformin   Is this a 90 day supply? Unknown  Name and location of pharmacy? [facility]

## 2020-04-27 NOTE — Telephone Encounter (Signed)
Refill sent.

## 2020-05-18 ENCOUNTER — Other Ambulatory Visit: Payer: Self-pay | Admitting: Internal Medicine

## 2020-05-20 ENCOUNTER — Ambulatory Visit
Admission: RE | Admit: 2020-05-20 | Discharge: 2020-05-20 | Disposition: A | Discharge status: Home / Self Care | Payer: Medicare Other | Source: Ambulatory Visit | Attending: Family Medicine | Admitting: Family Medicine

## 2020-05-20 ENCOUNTER — Other Ambulatory Visit: Payer: Self-pay

## 2020-05-20 DIAGNOSIS — Z1231 Encounter for screening mammogram for malignant neoplasm of breast: Secondary | ICD-10-CM

## 2020-06-18 ENCOUNTER — Other Ambulatory Visit: Payer: Self-pay | Admitting: Internal Medicine

## 2020-07-06 ENCOUNTER — Other Ambulatory Visit: Payer: Self-pay

## 2020-07-06 ENCOUNTER — Encounter: Payer: Self-pay | Admitting: Internal Medicine

## 2020-07-06 ENCOUNTER — Ambulatory Visit (INDEPENDENT_AMBULATORY_CARE_PROVIDER_SITE_OTHER): Payer: Medicare Other | Admitting: Internal Medicine

## 2020-07-06 VITALS — BP 150/100 | HR 101 | Ht 63.0 in | Wt 193.8 lb

## 2020-07-06 DIAGNOSIS — E118 Type 2 diabetes mellitus with unspecified complications: Secondary | ICD-10-CM | POA: Diagnosis not present

## 2020-07-06 DIAGNOSIS — Z794 Long term (current) use of insulin: Secondary | ICD-10-CM

## 2020-07-06 DIAGNOSIS — E785 Hyperlipidemia, unspecified: Secondary | ICD-10-CM

## 2020-07-06 DIAGNOSIS — E669 Obesity, unspecified: Secondary | ICD-10-CM | POA: Diagnosis not present

## 2020-07-06 LAB — LIPID PANEL
Cholesterol: 156 mg/dL (ref 0–200)
HDL: 56.7 mg/dL (ref >39.00)
LDL Cholesterol: 74 mg/dL (ref 0–99)
NonHDL: 98.89
Total CHOL/HDL Ratio: 3
Triglycerides: 126 mg/dL (ref 0.0–149.0)
VLDL: 25.2 mg/dL (ref 0.0–40.0)

## 2020-07-06 LAB — COMPREHENSIVE METABOLIC PANEL
ALT: 16 U/L (ref 0–35)
AST: 15 U/L (ref 0–37)
Albumin: 4.2 g/dL (ref 3.5–5.2)
Alkaline Phosphatase: 77 U/L (ref 39–117)
BUN: 15 mg/dL (ref 6–23)
CO2: 31 mEq/L (ref 19–32)
Calcium: 9.5 mg/dL (ref 8.4–10.5)
Chloride: 102 mEq/L (ref 96–112)
Creatinine, Ser: 0.96 mg/dL (ref 0.40–1.20)
GFR: 61.78 mL/min (ref >60.00)
Glucose, Bld: 123 mg/dL — ABNORMAL HIGH (ref 70–99)
Potassium: 4.3 mEq/L (ref 3.5–5.1)
Sodium: 137 mEq/L (ref 135–145)
Total Bilirubin: 0.4 mg/dL (ref 0.2–1.2)
Total Protein: 7.2 g/dL (ref 6.0–8.3)

## 2020-07-06 LAB — MICROALBUMIN / CREATININE URINE RATIO
Creatinine,U: 160.3 mg/dL
Microalb Creat Ratio: 0.6 mg/g (ref 0.0–30.0)
Microalb, Ur: 0.9 mg/dL (ref 0.0–1.9)

## 2020-07-06 LAB — POCT GLYCOSYLATED HEMOGLOBIN (HGB A1C): Hemoglobin A1C: 7.6 % — AB (ref 4.0–5.6)

## 2020-07-06 MED ORDER — ACCU-CHEK SMARTVIEW VI STRP
ORAL_STRIP | 3 refills | Status: DC
Start: 2020-07-06 — End: 2020-12-07

## 2020-07-06 NOTE — Progress Notes (Signed)
Patient ID: Donna Pham, female   DOB: [birth year], 64 y.o.   MRN: [id number]  This visit occurred during the SARS-CoV-2 public health emergency.  Safety protocols were in place, including screening questions prior to the visit, additional usage of staff PPE, and extensive cleaning of exam room while observing appropriate contact time as indicated for disinfecting solutions.   HPI: Donna Pham is a 64 y.o.-year-old [place] female,  presenting for f/u for DM2, dx 2000, insulin-dependent, uncontrolled, without long-term complications. Last visit 9 months ago.  At last visit, she had a lot of stress as a caregiver for her husband who was 54 years older than her and had dementia.  He died ~1 mo ago. She is now trying to sell her house.  Reviewed HbA1c levels: Lab Results  Component Value Date   HGBA1C 8.2 01/23/2020   HGBA1C 8.1 (A) 10/14/2019   HGBA1C 6.4 (A) 06/16/2019  12/24/2018: HbA1c 7.4% 01/23/2018: HbA1c 8.2% 10/09/2017: HbA1c 7.9% 07/06/2017: HbA1c 7.8%  She is on: - Metformin XR 1000 mg 2x a day with meals - Glipizide XL 5 mg before breakfast and 5 mg before dinner (crushed) - Tyler Aas 16 units daily - Trulicity 6.06 mg weekly - decreased 11/2018 2/2 AP and diarrhea >> 1.5 mg  - increased 09/2019 - no SEs She could not tolerate in the past Trulicity 2/2 AP. Toujeo was ineffective for her. Invokana 100 mg >> had 3 yeast infections since last visit >> we stopped it. She also stopped Cycloset 3 weeks ago (did not get it from pharmacy). She tried Avandia in the past.  She tried Actos in the past.   Pt checks her sugars once a day: - am: 80-100 >> 97-115 >> 119, 121-156, 162 >> 90-124 - 2h after b'fast: 130-150 >> 130-160 >> n/c - before lunch: 120-170 (fruit) >> 130-140 (grape) >> n/c - 2h after lunch: 155-160 >> 150-155 >> n/c - before dinner:  170 (snack) >> 140-150 >> n/c - 2h after dinner: 170  >> 150-160 >> 252, 278 >> 120-153 - nighttime:  128-163, 263  >> 125-201 >>  n/c >> 64, 69 once a week. Lowest: 67 >> 80 >> 97 >> 90. She has hgly awareness in the 59s.  Meter: [employer]  Pt's meals are: - Breakfast: coffee + waffle + PB; blueberry - Lunch: chicken noodle soup, stews - Dinner: fried chicken - Snacks: "many"  -No CKD, Prev. BUN/creatinine:  Lab Results  Component Value Date   BUN 16 06/16/2019   CREATININE 0.88 06/16/2019  24 h urine protein 85 (<150) Not on ACE inhibitor/ARB.  -+ HL; latest lipid panel: Lab Results  Component Value Date   CHOL 228 (H) 06/16/2019   HDL 43.50 06/16/2019   LDLCALC 150 (H) 06/16/2019   LDLDIRECT 167.2 08/22/2010   TRIG 175.0 (H) 06/16/2019   CHOLHDL 5 06/16/2019  On Crestor 20. Off Fenofibrate.  Reviewed labs from 10/09/2017: - ACR <7.9 - Glucose 139, BUN/creatinine 21/0.76, GFR 77, otherwise normal CMP - last eye exam was on 2021: No DR - reportedly  -No numbness and tingling in her feet.    On ASA 81.  She also has a history of fatty liver, HTN.  Her first husband died when she was 25 and he was 34 in a motor vehicle accident in [place].  She moved to [place] afterwards with 1 of her sons.  Her older son is still in [place].  She would like to move back to [place], but her husband  does not want to move.  Her younger son will move to [place] today.  He and his family was helping them, but now she is left without a car and she is quite stressed about it.    ROS: Constitutional: no weight gain/no weight loss, no fatigue, no subjective hyperthermia, no subjective hypothermia Eyes: no blurry vision, no xerophthalmia ENT: no sore throat, no nodules palpated in neck, no dysphagia, no odynophagia, no hoarseness Cardiovascular: no CP/no SOB/no palpitations/no leg swelling Respiratory: no cough/no SOB/no wheezing Gastrointestinal: no N/no V/no D/no C/no acid reflux Musculoskeletal: no muscle aches/no joint aches Skin: no rashes, no hair loss Neurological: no tremors/no numbness/no tingling/no  dizziness  I reviewed pt's medications, allergies, PMH, social hx, family hx, and changes were documented in the history of present illness. Otherwise, unchanged from my initial visit note.  Past Medical History:  Diagnosis Date  . Arthritis   . CTS (carpal tunnel syndrome)    6/15-19/09 RLL PNA with sepsis high LFTs (ALT 61)  . Depression   . Diabetes mellitus   . GERD (gastroesophageal reflux disease)   . Hyperlipidemia   . Hypertension   . Right upper quadrant abdominal pain    Past Surgical History:  Procedure Laterality Date  . EYE SURGERY     Social History   Socioeconomic History  . Marital status: Married    Spouse name: Not on file  . Number of children: 2  . Years of education: Not on file  . Highest education level: Not on file  Occupational History  . Occupation: Retired  Tobacco Use  . Smoking status: Never Smoker  . Smokeless tobacco: Never Used  Substance and Sexual Activity  . Alcohol use: No  . Drug use: No  . Sexual activity: Not on file  Other Topics Concern  . Not on file  Social History Narrative   No diet    Regular exercise-no   Originally from [place]   Social Determinants of Health   Financial Resource Strain: Not on file  Food Insecurity: Not on file  Transportation Needs: Not on file  Physical Activity: Not on file  Stress: Not on file  Social Connections: Not on file  Intimate Partner Violence: Not on file   Current Outpatient Medications on File Prior to Visit  Medication Sig Dispense Refill  . ACCU-CHEK FASTCLIX LANCETS MISC Check blood sugar twice daily 200 each 5  . ACCU-CHEK SMARTVIEW test strip CHECK BLOOD SUGAR FOUR TIMES DAILY 360 strip 11  . Alcohol Swabs (B-D SINGLE USE SWABS REGULAR) PADS Check blood sugar twice daily 200 each 3  . aspirin 81 MG tablet Take 81 mg by mouth daily.      . betamethasone dipropionate (DIPROLENE) 0.05 % cream Apply topically 2 (two) times daily. 30 g 0  . Blood Glucose Monitoring Suppl  (ACCU-CHEK NANO SMARTVIEW) w/Device KIT Check blood sugar as directed 1 kit 0  . esomeprazole (NEXIUM) 40 MG capsule TAKE 1 CAPSULE BY MOUTH EVERY DAY BEFORE BREAKFAST 90 capsule 1  . glipiZIDE (GLUCOTROL XL) 5 MG 24 hr tablet Take one tab with breakfast and one tablet with dinner 180 tablet 1  . Insulin Pen Needle (BD PEN NEEDLE NANO U/F) 32G X 4 MM MISC USE ONCE DAILY AS DIRECTED 100 each 3  . metformin (FORTAMET) 500 MG (OSM) 24 hr tablet Take 1 tablet (500 mg total) by mouth in the morning and at bedtime. 60 tablet 0  . rosuvastatin (CRESTOR) 20 MG tablet TAKE 1 AND   1/2 TABLETS BY MOUTH DAILY 135 tablet 0  . sertraline (ZOLOFT) 100 MG tablet Take 2 tablets (200 mg total) by mouth daily. 180 tablet 1  . TRESIBA FLEXTOUCH 200 UNIT/ML SOPN INJECT 16 UNITS INTO THE SKIN DAILY BEFORE BREAKFAST 27 mL 1  . TRULICITY 1.5 PI/9.5JO SOPN Inject 1.5 mg into the skin once a week. 2 mL 0   No current facility-administered medications on file prior to visit.   Allergies  Allergen Reactions  . Trulicity [Dulaglutide] Other (See Comments)    AP   Family History  Problem Relation Age of Onset  . Arthritis Father   . Cancer Father   . Diabetes Mother   . Heart disease Mother   . Breast cancer Neg Hx    PE: BP (!) 150/100   Pulse (!) 101   Ht 5' 3" (1.6 m)   Wt 193 lb 12.8 oz (87.9 kg)   SpO2 96%   BMI 34.33 kg/m  Wt Readings from Last 3 Encounters:  07/06/20 193 lb 12.8 oz (87.9 kg)  10/14/19 202 lb (91.6 kg)  06/16/19 197 lb (89.4 kg)   Constitutional: overweight, in NAD Eyes: PERRLA, EOMI, no exophthalmos ENT: moist mucous membranes, no thyromegaly, no cervical lymphadenopathy Cardiovascular: tachycardia, RR, No MRG Respiratory: CTA B Gastrointestinal: abdomen soft, NT, ND, BS+ Musculoskeletal: no deformities, strength intact in all 4 Skin: moist, warm, no rashes Neurological: no tremor with outstretched hands, DTR normal in all 4  ASSESSMENT: 1. DM2, insulin-dependent,  uncontrolled, without long term complications, but with hyperglycemia  2.  Hyperlipidemia  3. Obesity class 1  PLAN:  1. Patient with longstanding, uncontrolled, type 2 diabetes, on basal insulin, weekly GLP-1 receptor agonist and also Metformin and sulfonylurea, with higher blood sugars at last visit, and an HbA1c of 8.1%, increased from 6.4%.  At that time, she relaxed her diet and eating more sweets and also drinking juice and diet sodas.  I strongly advised her to eliminate the soft drinks.  We discussed about increasing Trulicity to 1.5 mg weekly since she was tolerating well the 0.75 mg dose.  We also added an instant release glipizide before dinner since sugars were higher after this meal. - at this visit, reviewing her meter downloads, sugars are much better, now almost all at goal, except for occasional low blood sugars during the night, in the 60s.  Therefore, I advised her to reduce the dose of glipizide with dinner from 5 mg to 2.5 mg.  She is tolerating the higher dose of Trulicity well so we will continue with this.  Since last visit, she mentions that she did stop juice and I believe that this is the main reason for the improvement in her blood sugars.  She also lost 9 pounds since last visit. - I advised her to: Patient Instructions  Please continue: - Metformin XR 1000 mg 2x a day with meals - Glipizide XL 5 mg before b'fast and decrease the dose before dinner to: 2.5 mg before dinner  - Trulicity 1.5 mg weekly - Tresiba 10 units daily  Please return in 3-4 months with your sugar log  - we checked her HbA1c: 7.6% (better) - advised to check sugars at different times of the day - 1-2x a day, rotating check times - advised for yearly eye exams >> she is UTD - We will check annual labs today - return to clinic in 3-4 months      2. HL -Latest lipid panel from 05/2019 was reviewed:  LDL still above target, triglycerides also high: Lab Results  Component Value Date   CHOL 228  (H) 06/16/2019   HDL 43.50 06/16/2019   LDLCALC 150 (H) 06/16/2019   LDLDIRECT 167.2 08/22/2010   TRIG 175.0 (H) 06/16/2019   CHOLHDL 5 06/16/2019  -Continues Crestor 20 mg but is off fenofibrate.  No side effects from the statin. -We will check another lipid panel today  3. Obesity class I -Continue Trulicity which should also help with weight loss -She gained 5 pounds before last visit, but lost 9 pounds before last visit, after stopping juice and after increasing Trulicity dose.  -She continues to drink diet sodas but trying to cut these down Component     Latest Ref Rng & Units 07/06/2020  Sodium     135 - 145 mEq/L 137  Potassium     3.5 - 5.1 mEq/L 4.3  Chloride     96 - 112 mEq/L 102  CO2     19 - 32 mEq/L 31  Glucose     70 - 99 mg/dL 123 (H)  BUN     6 - 23 mg/dL 15  Creatinine     0.40 - 1.20 mg/dL 0.96  Total Bilirubin     0.2 - 1.2 mg/dL 0.4  Alkaline Phosphatase     39 - 117 U/L 77  AST     0 - 37 U/L 15  ALT     0 - 35 U/L 16  Total Protein     6.0 - 8.3 g/dL 7.2  Albumin     3.5 - 5.2 g/dL 4.2  Calcium     8.4 - 10.5 mg/dL 9.5  GFR     >60.00 mL/min 61.78  Cholesterol     0 - 200 mg/dL 156  Triglycerides     0.0 - 149.0 mg/dL 126.0  HDL Cholesterol     >39.00 mg/dL 56.70  VLDL     0.0 - 40.0 mg/dL 25.2  LDL (calc)     0 - 99 mg/dL 74  Total CHOL/HDL Ratio      3  NonHDL      98.89  Microalb, Ur     0.0 - 1.9 mg/dL 0.9  Creatinine,U     mg/dL 160.3  MICROALB/CREAT RATIO     0.0 - 30.0 mg/g 0.6   Labs are at goal.  Cristina Gherghe, MD PhD [facility]  

## 2020-07-06 NOTE — Patient Instructions (Addendum)
Please continue: - Metformin XR 1000 mg 2x a day with meals - Glipizide XL 5 mg before b'fast and decrease th dose before dinner to: 2.5 mg before dinner  - Trulicity 1.5 mg weekly - Tresiba 10 units daily  Please return in 3-4 months with your sugar log

## 2020-07-08 ENCOUNTER — Telehealth: Payer: Self-pay | Admitting: Internal Medicine

## 2020-07-08 NOTE — Telephone Encounter (Signed)
Patient called to inform Dr Cruzita Lederer that she is taking 10 units of [place]. FYI

## 2020-07-21 ENCOUNTER — Telehealth: Payer: Self-pay | Admitting: Internal Medicine

## 2020-07-21 ENCOUNTER — Other Ambulatory Visit: Payer: Self-pay | Admitting: *Deleted

## 2020-07-21 DIAGNOSIS — K219 Gastro-esophageal reflux disease without esophagitis: Secondary | ICD-10-CM | POA: Diagnosis not present

## 2020-07-21 DIAGNOSIS — E7800 Pure hypercholesterolemia, unspecified: Secondary | ICD-10-CM | POA: Diagnosis not present

## 2020-07-21 DIAGNOSIS — E1121 Type 2 diabetes mellitus with diabetic nephropathy: Secondary | ICD-10-CM | POA: Diagnosis not present

## 2020-07-21 DIAGNOSIS — E782 Mixed hyperlipidemia: Secondary | ICD-10-CM | POA: Diagnosis not present

## 2020-07-21 DIAGNOSIS — N1831 Chronic kidney disease, stage 3a: Secondary | ICD-10-CM | POA: Diagnosis not present

## 2020-07-21 MED ORDER — METFORMIN HCL ER (OSM) 500 MG PO TB24: 500.0000 mg | ORAL_TABLET | Freq: Two times a day (BID) | ORAL | 1 refills | Status: DC

## 2020-07-21 MED ORDER — TRULICITY 1.5 MG/0.5ML ~~LOC~~ SOAJ: 1.5000 mg | SUBCUTANEOUS | 1 refills | Status: DC

## 2020-07-21 NOTE — Telephone Encounter (Signed)
Patients PCP office called to request refills for Metformin, Trulicity to be sent to [facility] - 90 day prescriptions

## 2020-07-21 NOTE — Telephone Encounter (Signed)
Rx's have been sent. 

## 2020-07-22 ENCOUNTER — Other Ambulatory Visit: Payer: Self-pay | Admitting: Internal Medicine

## 2020-07-23 ENCOUNTER — Telehealth: Payer: Self-pay | Admitting: Internal Medicine

## 2020-07-23 NOTE — Telephone Encounter (Signed)
OK Ty, C 

## 2020-07-23 NOTE — Telephone Encounter (Signed)
Okay to send 

## 2020-07-23 NOTE — Telephone Encounter (Signed)
[facility] called to request a 90 day refill for glipiZIDE (GLUCOTROL XL) 5 MG 24 hr tablet  to be sent to [facility]

## 2020-07-26 ENCOUNTER — Telehealth: Payer: Self-pay | Admitting: Internal Medicine

## 2020-07-26 ENCOUNTER — Other Ambulatory Visit: Payer: Self-pay | Admitting: *Deleted

## 2020-07-26 DIAGNOSIS — E118 Type 2 diabetes mellitus with unspecified complications: Secondary | ICD-10-CM

## 2020-07-26 DIAGNOSIS — Z794 Long term (current) use of insulin: Secondary | ICD-10-CM

## 2020-07-26 MED ORDER — GLIPIZIDE ER 5 MG PO TB24: ORAL_TABLET | ORAL | 1 refills | Status: DC

## 2020-07-26 NOTE — Telephone Encounter (Signed)
Rx sent 

## 2020-07-26 NOTE — Telephone Encounter (Signed)
Donna Pham with [facility] called to requests the following RX:  glipiZIDE (GLUCOTROL XL) 5 MG 24 hr tablet be sent to  [place], [place] - [address] Phone:  [phone]  Fax:  [phone]

## 2020-08-16 ENCOUNTER — Other Ambulatory Visit: Payer: Self-pay

## 2020-08-16 DIAGNOSIS — Z794 Long term (current) use of insulin: Secondary | ICD-10-CM

## 2020-08-16 DIAGNOSIS — E118 Type 2 diabetes mellitus with unspecified complications: Secondary | ICD-10-CM

## 2020-08-16 MED ORDER — METFORMIN HCL ER 500 MG PO TB24: ORAL_TABLET | ORAL | 2 refills | Status: DC

## 2020-08-16 NOTE — Progress Notes (Signed)
Inbound letter from [employer] advising Drug name: Metformin Tab 500MG  ER will not be covered and an alternate needs to sent to pharmacy for further refills. New rR for Glucophage ER was sent to pharmacy.

## 2020-08-17 DIAGNOSIS — R079 Chest pain, unspecified: Secondary | ICD-10-CM | POA: Diagnosis not present

## 2020-08-17 DIAGNOSIS — E782 Mixed hyperlipidemia: Secondary | ICD-10-CM | POA: Diagnosis not present

## 2020-08-17 DIAGNOSIS — K219 Gastro-esophageal reflux disease without esophagitis: Secondary | ICD-10-CM | POA: Diagnosis not present

## 2020-08-17 DIAGNOSIS — E1121 Type 2 diabetes mellitus with diabetic nephropathy: Secondary | ICD-10-CM | POA: Diagnosis not present

## 2020-08-17 DIAGNOSIS — Z23 Encounter for immunization: Secondary | ICD-10-CM | POA: Diagnosis not present

## 2020-08-23 DIAGNOSIS — E782 Mixed hyperlipidemia: Secondary | ICD-10-CM | POA: Diagnosis not present

## 2020-08-23 DIAGNOSIS — E7800 Pure hypercholesterolemia, unspecified: Secondary | ICD-10-CM | POA: Diagnosis not present

## 2020-08-23 DIAGNOSIS — E1121 Type 2 diabetes mellitus with diabetic nephropathy: Secondary | ICD-10-CM | POA: Diagnosis not present

## 2020-08-23 DIAGNOSIS — K219 Gastro-esophageal reflux disease without esophagitis: Secondary | ICD-10-CM | POA: Diagnosis not present

## 2020-08-23 DIAGNOSIS — N1831 Chronic kidney disease, stage 3a: Secondary | ICD-10-CM | POA: Diagnosis not present

## 2020-09-17 DIAGNOSIS — K219 Gastro-esophageal reflux disease without esophagitis: Secondary | ICD-10-CM | POA: Diagnosis not present

## 2020-09-17 DIAGNOSIS — E7800 Pure hypercholesterolemia, unspecified: Secondary | ICD-10-CM | POA: Diagnosis not present

## 2020-09-17 DIAGNOSIS — E782 Mixed hyperlipidemia: Secondary | ICD-10-CM | POA: Diagnosis not present

## 2020-09-17 DIAGNOSIS — N1831 Chronic kidney disease, stage 3a: Secondary | ICD-10-CM | POA: Diagnosis not present

## 2020-09-17 DIAGNOSIS — E1121 Type 2 diabetes mellitus with diabetic nephropathy: Secondary | ICD-10-CM | POA: Diagnosis not present

## 2020-10-28 ENCOUNTER — Ambulatory Visit: Payer: Medicare Other | Admitting: Internal Medicine

## 2020-11-09 ENCOUNTER — Telehealth: Payer: Self-pay | Admitting: Internal Medicine

## 2020-11-09 DIAGNOSIS — K219 Gastro-esophageal reflux disease without esophagitis: Secondary | ICD-10-CM | POA: Diagnosis not present

## 2020-11-09 DIAGNOSIS — E1121 Type 2 diabetes mellitus with diabetic nephropathy: Secondary | ICD-10-CM | POA: Diagnosis not present

## 2020-11-09 DIAGNOSIS — E7800 Pure hypercholesterolemia, unspecified: Secondary | ICD-10-CM | POA: Diagnosis not present

## 2020-11-09 DIAGNOSIS — N1831 Chronic kidney disease, stage 3a: Secondary | ICD-10-CM | POA: Diagnosis not present

## 2020-11-09 DIAGNOSIS — E782 Mixed hyperlipidemia: Secondary | ICD-10-CM | POA: Diagnosis not present

## 2020-11-09 NOTE — Telephone Encounter (Signed)
[facility] PCP called to advise that home health nurse reported to them an A1C of 8.8.  Also they are requesting last office notes to be faxed to them for continuity for care - fax # [phone].  Also

## 2020-11-09 NOTE — Telephone Encounter (Signed)
Fax was sent over to [facility] with last office visit. Donna Pham

## 2020-12-07 ENCOUNTER — Ambulatory Visit (INDEPENDENT_AMBULATORY_CARE_PROVIDER_SITE_OTHER): Payer: Medicare Other | Admitting: Internal Medicine

## 2020-12-07 ENCOUNTER — Encounter: Payer: Self-pay | Admitting: Internal Medicine

## 2020-12-07 ENCOUNTER — Other Ambulatory Visit: Payer: Self-pay

## 2020-12-07 VITALS — BP 128/80 | HR 88 | Ht 63.0 in | Wt 194.4 lb

## 2020-12-07 DIAGNOSIS — Z794 Long term (current) use of insulin: Secondary | ICD-10-CM | POA: Diagnosis not present

## 2020-12-07 DIAGNOSIS — E785 Hyperlipidemia, unspecified: Secondary | ICD-10-CM

## 2020-12-07 DIAGNOSIS — E118 Type 2 diabetes mellitus with unspecified complications: Secondary | ICD-10-CM | POA: Diagnosis not present

## 2020-12-07 DIAGNOSIS — E669 Obesity, unspecified: Secondary | ICD-10-CM | POA: Diagnosis not present

## 2020-12-07 LAB — POCT GLYCOSYLATED HEMOGLOBIN (HGB A1C): Hemoglobin A1C: 7.5 % — AB (ref 4.0–5.6)

## 2020-12-07 MED ORDER — TRESIBA FLEXTOUCH 200 UNIT/ML ~~LOC~~ SOPN: PEN_INJECTOR | SUBCUTANEOUS | 3 refills | Status: AC

## 2020-12-07 MED ORDER — BD PEN NEEDLE NANO U/F 32G X 4 MM MISC: 3 refills | Status: AC

## 2020-12-07 MED ORDER — ACCU-CHEK SMARTVIEW VI STRP: ORAL_STRIP | 3 refills | Status: DC

## 2020-12-07 MED ORDER — GLIPIZIDE ER 5 MG PO TB24
ORAL_TABLET | ORAL | 3 refills | Status: AC
Start: 2020-12-07 — End: ?

## 2020-12-07 MED ORDER — TRULICITY 1.5 MG/0.5ML ~~LOC~~ SOAJ: 1.5000 mg | SUBCUTANEOUS | 3 refills | Status: DC

## 2020-12-07 MED ORDER — METFORMIN HCL ER 500 MG PO TB24
ORAL_TABLET | ORAL | 3 refills | Status: AC
Start: 2020-12-07 — End: ?

## 2020-12-07 NOTE — Patient Instructions (Addendum)
Please continue: - Metformin XR 1000 mg 2x a day with meals - Glipizide XL 5 mg before b'fast and 2.5-5 mg before dinner  - Trulicity 1.5 mg weekly - Tresiba 10 units daily  Please return in 4 months with your sugar log

## 2020-12-07 NOTE — Progress Notes (Signed)
Patient ID: Donna Pham, female   DOB: 21-Apr-1956, 65 y.o.   MRN: [id number]  This visit occurred during the SARS-CoV-2 public health emergency.  Safety protocols were in place, including screening questions prior to the visit, additional usage of staff PPE, and extensive cleaning of exam room while observing appropriate contact time as indicated for disinfecting solutions.   HPI: Donna Pham is a 65 y.o.-year-old [place] female,  presenting for f/u for DM2, dx 2000, insulin-dependent, uncontrolled, without long-term complications. Last visit 5 months ago.  Interim history: When I last saw the patient 1 month prior to our last visit, her husband, who was 38 years older than her, died. She is trying to sell her house.  She will then go to [place] for a month and a half and when she returns she will moved to [place].  She plans to spend 6 months in [place] and 6 months in [place] from then on.  She did meet somebody in [place] and she is excited to go and see him.  One of her sons will get married there over the summer. No increased urination, blurry vision, nausea, or other complaints at this visit.  Reviewed HbA1c levels: Lab Results  Component Value Date   HGBA1C 7.6 (A) 07/06/2020   HGBA1C 8.2 01/23/2020   HGBA1C 8.1 (A) 10/14/2019  12/24/2018: HbA1c 7.4% 01/23/2018: HbA1c 8.2% 10/09/2017: HbA1c 7.9% 07/06/2017: HbA1c 7.8%  She is on: - Metformin XR 1000 mg 2x a day with meals - Glipizide XL 5 mg before breakfast and 5 >> 2.5 >> 5 mg before dinner (crushed) - but forgot this since last OV >> just restarted 1 mo ago  - Tresiba 16 >> 10 units daily - Trulicity 7.02 mg weekly - decreased 11/2018 2/2 AP and diarrhea >> 1.5 mg  - increased 09/2019 - no SEs She could not tolerate in the past Trulicity 2/2 AP. Toujeo was ineffective for her. Invokana 100 mg >> had 3 yeast infections since last visit >> we stopped it. She also stopped Cycloset 3 weeks ago (did not get it from pharmacy). She  tried Avandia in the past.  She tried Actos in the past.   Pt checks her sugars 2x a day: - am:  97-115 >> 119, 121-156, 162 >> 90-124 >> 90-110 - 2h after b'fast: 130-150 >> 130-160 >> n/c - before lunch: 120-170 (fruit) >> 130-140 (grape) >> n/c - 2h after lunch: 155-160 >> 150-155 >> n/c - before dinner:  170 (snack) >> 140-150 >> n/c >> 86-128, 145 - 2h after dinner: 150-160 >> 252, 278 >> 120-153 >> n/c - nighttime:  125-201 >> n/c >> 64, 69 once a week >> n/c Lowest: 97 >> 90 >> 90. She has hgly awareness in the 34s. Highest: 280 (no Glipizide)  Meter: [employer]  Pt's meals are: - Breakfast: coffee + waffle + PB; blueberry - Lunch: chicken noodle soup, stews - Dinner: fried chicken - Snacks: "many"  -No CKD, Prev. BUN/creatinine:  Lab Results  Component Value Date   BUN 15 07/06/2020   CREATININE 0.96 07/06/2020  24 h urine protein 85 (<150) Not on ACE inhibitor/ARB.  -+ HL; latest lipid panel: Lab Results  Component Value Date   CHOL 156 07/06/2020   HDL 56.70 07/06/2020   LDLCALC 74 07/06/2020   LDLDIRECT 167.2 08/22/2010   TRIG 126.0 07/06/2020   CHOLHDL 3 07/06/2020  On Crestor 20. Off Fenofibrate.  Reviewed labs from 10/09/2017: - ACR <7.9 - Glucose 139, BUN/creatinine 21/0.76,  GFR 77, otherwise normal CMP - last eye exam was on 2021: No DR - reportedly  -No numbness and tingling in her feet.    On ASA 81.  She also has a history of fatty liver, HTN.  Her first husband died when she was 19 and he was 44 in a motor vehicle accident in [place].  She moved to [place] afterwards with 1 of her sons.  Her older son is still in [place].  Her younger son moved to [place].   ROS: Constitutional: no weight gain/no weight loss, no fatigue, no subjective hyperthermia, no subjective hypothermia Eyes: no blurry vision, no xerophthalmia ENT: no sore throat, no nodules palpated in neck, no dysphagia, no odynophagia, no hoarseness Cardiovascular: no  CP/no SOB/no palpitations/no leg swelling Respiratory: no cough/no SOB/no wheezing Gastrointestinal: no N/no V/no D/no C/no acid reflux Musculoskeletal: no muscle aches/no joint aches Skin: no rashes, no hair loss Neurological: no tremors/no numbness/no tingling/no dizziness  I reviewed pt's medications, allergies, PMH, social hx, family hx, and changes were documented in the history of present illness. Otherwise, unchanged from my initial visit note.  Past Medical History:  Diagnosis Date  . Arthritis   . CTS (carpal tunnel syndrome)    6/15-19/09 RLL PNA with sepsis high LFTs (ALT 61)  . Depression   . Diabetes mellitus   . GERD (gastroesophageal reflux disease)   . Hyperlipidemia   . Hypertension   . Right upper quadrant abdominal pain    Past Surgical History:  Procedure Laterality Date  . EYE SURGERY     Social History   Socioeconomic History  . Marital status: Married    Spouse name: Not on file  . Number of children: 2  . Years of education: Not on file  . Highest education level: Not on file  Occupational History  . Occupation: Retired  Tobacco Use  . Smoking status: Never Smoker  . Smokeless tobacco: Never Used  Substance and Sexual Activity  . Alcohol use: No  . Drug use: No  . Sexual activity: Not on file  Other Topics Concern  . Not on file  Social History Narrative   No diet    Regular exercise-no   Originally from [place]   Social Determinants of Radio broadcast assistant Strain: Not on [employer] Insecurity: Not on file  Transportation Needs: Not on file  Physical Activity: Not on file  Stress: Not on file  Social Connections: Not on file  Intimate Partner Violence: Not on file   Current Outpatient Medications on File Prior to Visit  Medication Sig Dispense Refill  . ACCU-CHEK FASTCLIX LANCETS MISC Check blood sugar twice daily 200 each 5  . Alcohol Swabs (B-D SINGLE USE SWABS REGULAR) PADS Check blood sugar twice daily 200 each 3  .  aspirin 81 MG tablet Take 81 mg by mouth daily.      . betamethasone dipropionate (DIPROLENE) 0.05 % cream Apply topically 2 (two) times daily. 30 g 0  . Blood Glucose Monitoring Suppl (ACCU-CHEK NANO SMARTVIEW) w/Device KIT Check blood sugar as directed 1 kit 0  . Dulaglutide (TRULICITY) 1.5 GE/9.5MW SOPN Inject 1.5 mg into the skin once a week. 6 mL 1  . esomeprazole (NEXIUM) 40 MG capsule TAKE 1 CAPSULE BY MOUTH EVERY DAY BEFORE BREAKFAST 90 capsule 1  . glipiZIDE (GLUCOTROL XL) 5 MG 24 hr tablet Take one tab with breakfast and one tablet with dinner 180 tablet 1  . glucose blood (ACCU-CHEK SMARTVIEW) test  strip CHECK BLOOD SUGAR FOUR TIMES DAILY 400 strip 3  . Insulin Pen Needle (BD PEN NEEDLE NANO U/F) 32G X 4 MM MISC USE ONCE DAILY AS DIRECTED 100 each 3  . metFORMIN (GLUCOPHAGE XR) 500 MG 24 hr tablet Take 1 tablet (500 mg total) by mouth in the morning and at bedtime. 180 tablet 2  . rosuvastatin (CRESTOR) 20 MG tablet TAKE 1 AND 1/2 TABLETS BY MOUTH DAILY 135 tablet 0  . sertraline (ZOLOFT) 100 MG tablet Take 2 tablets (200 mg total) by mouth daily. 180 tablet 1  . TRESIBA FLEXTOUCH 200 UNIT/ML FlexTouch Pen INJECT 16 UNITS SUBCUTANEOUSLY BEFORE BREAKFAST 9 mL 2   No current facility-administered medications on file prior to visit.   Allergies  Allergen Reactions  . Trulicity [Dulaglutide] Other (See Comments)    AP   Family History  Problem Relation Age of Onset  . Arthritis Father   . Cancer Father   . Diabetes Mother   . Heart disease Mother   . Breast cancer Neg Hx    PE: BP 128/80 (BP Location: Right Arm, Patient Position: Sitting, Cuff Size: Normal)   Pulse 88   Ht 5' 3"  (1.6 m)   Wt 194 lb 6.4 oz (88.2 kg)   SpO2 96%   BMI 34.44 kg/m  Wt Readings from Last 3 Encounters:  12/07/20 194 lb 6.4 oz (88.2 kg)  07/06/20 193 lb 12.8 oz (87.9 kg)  10/14/19 202 lb (91.6 kg)   Constitutional: overweight, in NAD Eyes: PERRLA, EOMI, no exophthalmos ENT: moist mucous  membranes, no thyromegaly, no cervical lymphadenopathy Cardiovascular: tachycardia, RR, No MRG Respiratory: CTA B Gastrointestinal: abdomen soft, NT, ND, BS+ Musculoskeletal: no deformities, strength intact in all 4 Skin: moist, warm, no rashes Neurological: no tremor with outstretched hands, DTR normal in all 4  ASSESSMENT: 1. DM2, insulin-dependent, uncontrolled, without long term complications, but with hyperglycemia  2.  Hyperlipidemia  3. Obesity class 1  PLAN:  1. Patient with longstanding, uncontrolled, type 2 diabetes, on basal insulin, weekly GLP-1 receptor agonist, and oral medication with metformin and sulfonylurea.  At last visit, HbA1c improved to 7.6% but she was having some low blood sugars during the night, in the 60s.  Therefore, we reduced the dose of glipizide before dinner.  Before last visit she stopped drinking juice and I believe that this was the reason for her improved blood sugars.  She also lost 9 pounds.  I strongly advised her to continue without sweet drinks. -At today's visit, sugars appear well controlled when she checks before dinner and per her report, they are also at goal in the morning.  However, they were higher approximately a month ago, and she realized that the reason for this is her not taking the glipizide as she mixed it up with another medication.  Since then, she restarted it back at 5 mg twice a day and sugars improved.  No lows.  I did advise him to continue with this regimen but to consider taking a lower dose of glipizide, 2.5 mg before a smaller dinner. -I refilled her medicines for 3 months so that she does not have problems with refills [place]. - I advised her to: Patient Instructions  Please continue: - Metformin XR 1000 mg 2x a day with meals - Glipizide XL 5 mg before b'fast and 2.5 mg before dinner  - Trulicity 1.5 mg weekly - Tresiba 10 units daily  Please return in 3-4 months with  your sugar log  - we  checked her HbA1c: 7.5% (lower) - advised to check sugars at different times of the day - 1x a day, rotating check times - advised for yearly eye exams >> she is UTD - return to clinic in 3-4 months      2. HL -Latest lipid panel from 06/2020 was reviewed and fractions were at goal. Lab Results  Component Value Date   CHOL 156 07/06/2020   HDL 56.70 07/06/2020   LDLCALC 74 07/06/2020   LDLDIRECT 167.2 08/22/2010   TRIG 126.0 07/06/2020   CHOLHDL 3 07/06/2020  -Continues Crestor 20 mg daily without side effects.  Off fenofibrate.  3. Obesity class I -We will continue Trulicity which should also help with weight loss -She lost 9 pounds before last visit, but weight stable since then  Philemon Kingdom, MD PhD [facility]

## 2020-12-07 NOTE — Addendum Note (Signed)
Addended by: Lauralyn Primes on: 12/07/2020 04:47 PM   Modules accepted: Orders

## 2020-12-14 DIAGNOSIS — E1121 Type 2 diabetes mellitus with diabetic nephropathy: Secondary | ICD-10-CM | POA: Diagnosis not present

## 2020-12-14 DIAGNOSIS — E7800 Pure hypercholesterolemia, unspecified: Secondary | ICD-10-CM | POA: Diagnosis not present

## 2020-12-14 DIAGNOSIS — K219 Gastro-esophageal reflux disease without esophagitis: Secondary | ICD-10-CM | POA: Diagnosis not present

## 2020-12-14 DIAGNOSIS — E782 Mixed hyperlipidemia: Secondary | ICD-10-CM | POA: Diagnosis not present

## 2020-12-14 DIAGNOSIS — N1831 Chronic kidney disease, stage 3a: Secondary | ICD-10-CM | POA: Diagnosis not present

## 2020-12-21 DIAGNOSIS — E7800 Pure hypercholesterolemia, unspecified: Secondary | ICD-10-CM | POA: Diagnosis not present

## 2020-12-21 DIAGNOSIS — E782 Mixed hyperlipidemia: Secondary | ICD-10-CM | POA: Diagnosis not present

## 2020-12-21 DIAGNOSIS — N1831 Chronic kidney disease, stage 3a: Secondary | ICD-10-CM | POA: Diagnosis not present

## 2020-12-21 DIAGNOSIS — E1121 Type 2 diabetes mellitus with diabetic nephropathy: Secondary | ICD-10-CM | POA: Diagnosis not present

## 2020-12-21 DIAGNOSIS — K219 Gastro-esophageal reflux disease without esophagitis: Secondary | ICD-10-CM | POA: Diagnosis not present

## 2021-03-09 DIAGNOSIS — E782 Mixed hyperlipidemia: Secondary | ICD-10-CM | POA: Diagnosis not present

## 2021-03-09 DIAGNOSIS — K219 Gastro-esophageal reflux disease without esophagitis: Secondary | ICD-10-CM | POA: Diagnosis not present

## 2021-03-09 DIAGNOSIS — E7800 Pure hypercholesterolemia, unspecified: Secondary | ICD-10-CM | POA: Diagnosis not present

## 2021-03-09 DIAGNOSIS — E1121 Type 2 diabetes mellitus with diabetic nephropathy: Secondary | ICD-10-CM | POA: Diagnosis not present

## 2021-03-09 DIAGNOSIS — N1831 Chronic kidney disease, stage 3a: Secondary | ICD-10-CM | POA: Diagnosis not present

## 2021-03-18 DIAGNOSIS — E1121 Type 2 diabetes mellitus with diabetic nephropathy: Secondary | ICD-10-CM | POA: Diagnosis not present

## 2021-03-18 DIAGNOSIS — N1831 Chronic kidney disease, stage 3a: Secondary | ICD-10-CM | POA: Diagnosis not present

## 2021-03-18 DIAGNOSIS — Z8616 Personal history of COVID-19: Secondary | ICD-10-CM | POA: Diagnosis not present

## 2021-03-18 DIAGNOSIS — E7800 Pure hypercholesterolemia, unspecified: Secondary | ICD-10-CM | POA: Diagnosis not present

## 2021-03-18 DIAGNOSIS — R202 Paresthesia of skin: Secondary | ICD-10-CM | POA: Diagnosis not present

## 2021-03-29 DIAGNOSIS — M75101 Unspecified rotator cuff tear or rupture of right shoulder, not specified as traumatic: Secondary | ICD-10-CM | POA: Diagnosis not present

## 2021-04-08 DIAGNOSIS — N1831 Chronic kidney disease, stage 3a: Secondary | ICD-10-CM | POA: Diagnosis not present

## 2021-04-08 DIAGNOSIS — K219 Gastro-esophageal reflux disease without esophagitis: Secondary | ICD-10-CM | POA: Diagnosis not present

## 2021-04-08 DIAGNOSIS — E1121 Type 2 diabetes mellitus with diabetic nephropathy: Secondary | ICD-10-CM | POA: Diagnosis not present

## 2021-04-08 DIAGNOSIS — E7800 Pure hypercholesterolemia, unspecified: Secondary | ICD-10-CM | POA: Diagnosis not present

## 2021-04-08 DIAGNOSIS — E782 Mixed hyperlipidemia: Secondary | ICD-10-CM | POA: Diagnosis not present

## 2021-04-14 ENCOUNTER — Encounter: Payer: Self-pay | Admitting: Internal Medicine

## 2021-04-14 ENCOUNTER — Ambulatory Visit (INDEPENDENT_AMBULATORY_CARE_PROVIDER_SITE_OTHER): Payer: Medicare Other | Admitting: Internal Medicine

## 2021-04-14 ENCOUNTER — Other Ambulatory Visit: Payer: Self-pay

## 2021-04-14 VITALS — BP 120/88 | HR 74 | Ht 63.0 in | Wt 195.8 lb

## 2021-04-14 DIAGNOSIS — E669 Obesity, unspecified: Secondary | ICD-10-CM | POA: Diagnosis not present

## 2021-04-14 DIAGNOSIS — E785 Hyperlipidemia, unspecified: Secondary | ICD-10-CM | POA: Diagnosis not present

## 2021-04-14 DIAGNOSIS — Z794 Long term (current) use of insulin: Secondary | ICD-10-CM | POA: Diagnosis not present

## 2021-04-14 DIAGNOSIS — E118 Type 2 diabetes mellitus with unspecified complications: Secondary | ICD-10-CM | POA: Diagnosis not present

## 2021-04-14 LAB — POCT GLYCOSYLATED HEMOGLOBIN (HGB A1C): Hemoglobin A1C: 7.8 % — AB (ref 4.0–5.6)

## 2021-04-14 MED ORDER — TRULICITY 3 MG/0.5ML ~~LOC~~ SOAJ: 3.0000 mg | SUBCUTANEOUS | 3 refills | Status: AC

## 2021-04-14 MED ORDER — TRULICITY 3 MG/0.5ML ~~LOC~~ SOAJ: 3.0000 mg | SUBCUTANEOUS | 3 refills | Status: DC

## 2021-04-14 NOTE — Progress Notes (Signed)
Patient ID: Marguerita Stapp, female   DOB: 05-16-1956, 65 y.o.   MRN: [id number]  This visit occurred during the SARS-CoV-2 public health emergency.  Safety protocols were in place, including screening questions prior to the visit, additional usage of staff PPE, and extensive cleaning of exam room while observing appropriate contact time as indicated for disinfecting solutions.   HPI: Veleta Yamamoto is a 65 y.o.-year-old [place] female,  presenting for f/u for DM2, dx 2000, insulin-dependent, uncontrolled, without long-term complications. Last visit 5 months ago.  Interim history: Her husband, who was 59 years older than her, died at the end of last year.  She sold her house and will move to [place] to be closer to her son - moving today.  She plans to live in [place] for 6 months a year and in [place] for the rest of the year.   She just returned from [place] where she attended her other son's wedding and she also visited monasteries. She did get COVID-19 in [place], but she had a mild form. No increased urination, blurry vision, nausea, chest pain.  Reviewed HbA1c levels: 03/24/2021: HbA1c 8.4% Lab Results  Component Value Date   HGBA1C 7.5 (A) 12/07/2020   HGBA1C 7.6 (A) 07/06/2020   HGBA1C 8.2 01/23/2020  12/24/2018: HbA1c 7.4% 01/23/2018: HbA1c 8.2% 10/09/2017: HbA1c 7.9% 07/06/2017: HbA1c 7.8%  She is on: - Metformin XR 1000 mg 2x a day with meals - Glipizide XL 5 mg before breakfast and 5 >> 2.5 >> 5 >> 5 mg before dinner (crushed)  - Tresiba 16 >> 10 units daily - Trulicity 9.73 mg weekly - decreased 11/2018 2/2 AP and diarrhea >> 1.5 mg  - increased 09/2019 - no SEs She could not tolerate in the past Trulicity 2/2 AP. Toujeo was ineffective for her. Invokana 100 mg >> had 3 yeast infections since last visit >> we stopped it. She also stopped Cycloset 3 weeks ago (did not get it from pharmacy). She tried Avandia in the past.  She tried Actos in the past.   Pt checks her sugars 2x a  day: - am:  97-115 >> 119, 121-156, 162 >> 90-124 >> 90-110 >> 99-125 - 2h after b'fast: 130-150 >> 130-160 >> n/c - before lunch: 120-170 (fruit) >> 130-140 (grapes) >> n/c >> 140s (snack) - 2h after lunch: 155-160 >> 150-155 >> n/c - before dinner: 140-150 >> n/c >> 86-128, 145 >> 110, 130-160 - 2h after dinner: 150-160 >> 252, 278 >> 120-153 >> n/c - nighttime:  125-201 >> n/c >> 64, 69 once a week >> n/c Lowest: 97 >> 90 >> 90>> 999. She has hgly awareness in the 2s. Highest: 280 (no Glipizide)  Meter: AccuChek Smart View  Pt's meals are: - Breakfast: coffee + waffle + PB; blueberry - Lunch: chicken noodle soup, stews - Dinner: fried chicken - Snacks: "many": chips, fruit  -No CKD, Prev. BUN/creatinine:  Lab Results  Component Value Date   BUN 15 07/06/2020   CREATININE 0.96 07/06/2020  24 h urine protein 85 (<150) Not on ACE inhibitor/ARB.  -+ HL; latest lipid panel: Lab Results  Component Value Date   CHOL 156 07/06/2020   HDL 56.70 07/06/2020   LDLCALC 74 07/06/2020   LDLDIRECT 167.2 08/22/2010   TRIG 126.0 07/06/2020   CHOLHDL 3 07/06/2020  On Crestor 20. Off Fenofibrate.  Reviewed labs from 10/09/2017: - ACR <7.9 - Glucose 139, BUN/creatinine 21/0.76, GFR 77, otherwise normal CMP - last eye exam was on 2021: No DR -  reportedly  -No numbness and tingling in her feet.    On ASA 81.  She also has a history of fatty liver, HTN.  Her first husband died when she was 78 and he was 54 in a motor vehicle accident in [place].  She moved to [place] afterwards with 1 of her sons.  Her older son is still in [place].  Her younger son moved to [place].   ROS: + See HPI  I reviewed pt's medications, allergies, PMH, social hx, family hx, and changes were documented in the history of present illness. Otherwise, unchanged from my initial visit note.  Past Medical History:  Diagnosis Date   Arthritis    CTS (carpal tunnel syndrome)    6/15-19/09 RLL PNA with sepsis  high LFTs (ALT 61)   Depression    Diabetes mellitus    GERD (gastroesophageal reflux disease)    Hyperlipidemia    Hypertension    Right upper quadrant abdominal pain    Past Surgical History:  Procedure Laterality Date   EYE SURGERY     Social History   Socioeconomic History   Marital status: Married    Spouse name: Not on file   Number of children: 2   Years of education: Not on file   Highest education level: Not on file  Occupational History   Occupation: Retired  Tobacco Use   Smoking status: Never   Smokeless tobacco: Never  Substance and Sexual Activity   Alcohol use: No   Drug use: No   Sexual activity: Not on file  Other Topics Concern   Not on file  Social History Narrative   No diet    Regular exercise-no   Originally from [place]   Social Determinants of Radio broadcast assistant Strain: Not on [employer] Insecurity: Not on file  Transportation Needs: Not on file  Physical Activity: Not on file  Stress: Not on file  Social Connections: Not on file  Intimate Partner Violence: Not on file   Current Outpatient Medications on File Prior to Visit  Medication Sig Dispense Refill   ACCU-CHEK FASTCLIX LANCETS MISC Check blood sugar twice daily 200 each 5   Alcohol Swabs (B-D SINGLE USE SWABS REGULAR) PADS Check blood sugar twice daily 200 each 3   aspirin 81 MG tablet Take 81 mg by mouth daily.     betamethasone dipropionate (DIPROLENE) 0.05 % cream Apply topically 2 (two) times daily. 30 g 0   Blood Glucose Monitoring Suppl (ACCU-CHEK NANO SMARTVIEW) w/Device KIT Check blood sugar as directed 1 kit 0   Dulaglutide (TRULICITY) 1.5 TM/5.4YT SOPN Inject 1.5 mg into the skin once a week. 6 mL 3   esomeprazole (NEXIUM) 40 MG capsule TAKE 1 CAPSULE BY MOUTH EVERY DAY BEFORE BREAKFAST 90 capsule 1   glipiZIDE (GLUCOTROL XL) 5 MG 24 hr tablet Take one tab with breakfast and one tablet with dinner 180 tablet 3   glucose blood (ACCU-CHEK SMARTVIEW) test strip  CHECK BLOOD SUGAR FOUR TIMES DAILY 400 strip 3   insulin degludec (TRESIBA FLEXTOUCH) 200 UNIT/ML FlexTouch Pen INJECT 10-16 UNITS SUBCUTANEOUSLY BEFORE BREAKFAST 12 mL 3   Insulin Pen Needle (BD PEN NEEDLE NANO U/F) 32G X 4 MM MISC USE ONCE DAILY AS DIRECTED 100 each 3   metFORMIN (GLUCOPHAGE XR) 500 MG 24 hr tablet Take 1000 mg by mouth in the morning and at bedtime. 360 tablet 3   rosuvastatin (CRESTOR) 20 MG tablet TAKE 1 AND 1/2 TABLETS BY  MOUTH DAILY 135 tablet 0   sertraline (ZOLOFT) 100 MG tablet Take 2 tablets (200 mg total) by mouth daily. 180 tablet 1   No current facility-administered medications on file prior to visit.   No Known Allergies  Family History  Problem Relation Age of Onset   Arthritis Father    Cancer Father    Diabetes Mother    Heart disease Mother    Breast cancer Neg Hx    PE: BP 120/88 (BP Location: Right Arm, Patient Position: Sitting, Cuff Size: Normal)   Pulse 74   Ht _0  (1.6 m)   Wt 195 lb 12.8 oz (88.8 kg)   SpO2 98%   BMI 34.68 kg/m  Wt Readings from Last 3 Encounters:  04/14/21 195 lb 12.8 oz (88.8 kg)  12/07/20 194 lb 6.4 oz (88.2 kg)  07/06/20 193 lb 12.8 oz (87.9 kg)   Constitutional: overweight, in NAD Eyes: PERRLA, EOMI, no exophthalmos ENT: moist mucous membranes, no thyromegaly, no cervical lymphadenopathy Cardiovascular: tachycardia, RR, No MRG Respiratory: CTA B Gastrointestinal: abdomen soft, NT, ND, BS+ Musculoskeletal: no deformities, strength intact in all 4 Skin: moist, warm, no rashes Neurological: no tremor with outstretched hands, DTR normal in all 4  ASSESSMENT: 1. DM2, insulin-dependent, uncontrolled, without long term complications, but with hyperglycemia  2.  Hyperlipidemia  3. Obesity class 1  PLAN:  1. Patient with longstanding, uncontrolled, type 2 diabetes, on basal insulin, weekly GLP-1 receptor agonist, oral antidiabetic regimen with metformin and sulfonylurea.  At last visit, HbA1c was slightly  lower, at 7.5%, sugars appear to be well controlled when she was [place] with her dinner and they were also at goal in the morning.  However, they were slightly higher compared to the sugars approximately 1 months prior to the appointment and she realized that this was because she was not taking glipizide.  She just restarted to take this before our last appointment that sugars started to improve.  She had no lows.  I advised at that time to take half a tablet, rather than 1 full tablet, of glipizide before a smaller dinner to avoid nocturnal hypoglycemia. -Over the summer, she went to [place] and traveled through the country. -She had another HbA1c obtained 3 weeks ago and this was higher, at 8.4%. -At today's visit, sugars are slightly higher than before.  They are mostly at goal in the morning but they increase as the day goes by due to many snacks.  We did discuss about trying to stop the snacks but for now I suggested to increase the dose of Trulicity, which she now tolerates well, to decrease her appetite and improve her sugars later in the day.  She agrees with this change.  We can continue the rest of the regimen. - I advised her to: Patient Instructions  Please continue: - Metformin XR 1000 mg 2x a day with meals - Glipizide XL 5 mg before b'fast and 5 mg before dinner  - Tresiba 10 units daily  Please increase: - Trulicity 3 mg weekly  - advised to check sugars at different times of the day - 1x a day, rotating check times - advised for yearly eye exams >> she is UTD - this is her last appointment with me, states she is moving to [place] today      2. HL -Reviewed latest lipid panel from 06/2020: Fractions at goal: Lab Results  Component Value Date   CHOL 156 07/06/2020   HDL 56.70 07/06/2020   [place]  74 07/06/2020   LDLDIRECT 167.2 08/22/2010   TRIG 126.0 07/06/2020   CHOLHDL 3 07/06/2020  -She continues on 20 mg of Crestor daily without side effects.  Previously on fenofibrate,  now off.  3. Obesity class I -We will continue Trulicity which should also help with weight loss -Weight was stable at last visit, previously lost 9 pounds -Gained 1 pound since last visit  Philemon Kingdom, MD PhD [facility]

## 2021-04-14 NOTE — Patient Instructions (Addendum)
Please continue: - Metformin XR 1000 mg 2x a day with meals - Glipizide XL 5 mg before b'fast and 5 mg before dinner  - Tresiba 10 units daily  Please increase: - Trulicity 3 mg weekly

## 2021-06-02 ENCOUNTER — Other Ambulatory Visit: Payer: Self-pay | Admitting: Internal Medicine
